# Patient Record
Sex: Female | Born: 1975 | Race: Black or African American | Hispanic: No | Marital: Married | Smoking: Never smoker
Health system: Southern US, Community
[De-identification: ages and names within clinical notes are randomized; demographics above are authoritative.]

## PROBLEM LIST (undated history)

## (undated) DIAGNOSIS — C801 Malignant (primary) neoplasm, unspecified: Secondary | ICD-10-CM

## (undated) DIAGNOSIS — J45909 Unspecified asthma, uncomplicated: Secondary | ICD-10-CM

## (undated) DIAGNOSIS — Z91018 Allergy to other foods: Secondary | ICD-10-CM

## (undated) DIAGNOSIS — L732 Hidradenitis suppurativa: Secondary | ICD-10-CM

## (undated) DIAGNOSIS — J309 Allergic rhinitis, unspecified: Secondary | ICD-10-CM

## (undated) DIAGNOSIS — M545 Low back pain, unspecified: Secondary | ICD-10-CM

## (undated) DIAGNOSIS — H1045 Other chronic allergic conjunctivitis: Secondary | ICD-10-CM

## (undated) DIAGNOSIS — M2550 Pain in unspecified joint: Secondary | ICD-10-CM

## (undated) DIAGNOSIS — Z91013 Allergy to seafood: Secondary | ICD-10-CM

## (undated) DIAGNOSIS — L503 Dermatographic urticaria: Secondary | ICD-10-CM

## (undated) DIAGNOSIS — S73191A Other sprain of right hip, initial encounter: Secondary | ICD-10-CM

## (undated) HISTORY — DX: Other chronic allergic conjunctivitis: H10.45

## (undated) HISTORY — DX: Hidradenitis suppurativa: L73.2

## (undated) HISTORY — DX: Allergy to seafood: Z91.013

## (undated) HISTORY — PX: ROTATOR CUFF REPAIR: SHX139

## (undated) HISTORY — DX: Pain in unspecified joint: M25.50

## (undated) HISTORY — DX: Allergic rhinitis, unspecified: J30.9

## (undated) HISTORY — DX: Allergy to other foods: Z91.018

## (undated) HISTORY — DX: Low back pain, unspecified: M54.50

## (undated) HISTORY — DX: Dermatographic urticaria: L50.3

## (undated) HISTORY — DX: Unspecified asthma, uncomplicated: J45.909

## (undated) HISTORY — DX: Other sprain of right hip, initial encounter: S73.191A

---

## 1998-06-04 ENCOUNTER — Emergency Department (HOSPITAL_COMMUNITY): Admission: EM | Admit: 1998-06-04 | Discharge: 1998-06-04 | Payer: Self-pay | Admitting: Emergency Medicine

## 1999-03-27 ENCOUNTER — Other Ambulatory Visit: Admission: RE | Admit: 1999-03-27 | Discharge: 1999-03-27 | Payer: Self-pay | Admitting: Obstetrics & Gynecology

## 1999-04-21 ENCOUNTER — Emergency Department (HOSPITAL_COMMUNITY): Admission: EM | Admit: 1999-04-21 | Discharge: 1999-04-21 | Payer: Self-pay | Admitting: Emergency Medicine

## 2000-07-07 ENCOUNTER — Other Ambulatory Visit: Admission: RE | Admit: 2000-07-07 | Discharge: 2000-07-07 | Payer: Self-pay | Admitting: Obstetrics & Gynecology

## 2008-06-12 ENCOUNTER — Emergency Department (HOSPITAL_COMMUNITY): Admission: EM | Admit: 2008-06-12 | Discharge: 2008-06-13 | Payer: Self-pay | Admitting: Emergency Medicine

## 2009-09-23 IMAGING — CT CT ANGIO CHEST
3 of 4 series · 18 of 31 positions shown · IV contrast (agent unspecified)
Comparison: None available

CLINICAL DATA: Abdominal pain.  Short of breath.  Leg swelling.

CT ANGIOGRAPHY CHEST
TECHNIQUE: Multidetector CT imaging of the chest using the
standard protocol during bolus administration of intravenous
contrast. Multiplanar reconstructed images including MIPs were
obtained and reviewed to evaluate the vascular anatomy.
Contrast: 100 ml [id number].

[Series 2: pe · axial · 0.70mm/px · z∈[-303,-44]mm · 12 of 254 slices shown]
[im 24/254  lung]
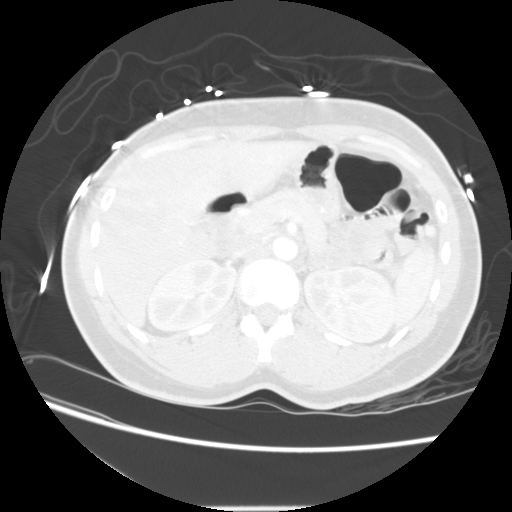
[im 47/254  mediastinal]
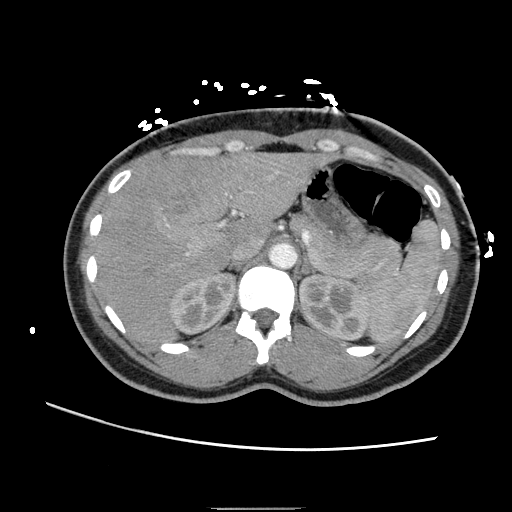
[im 70/254  lung]
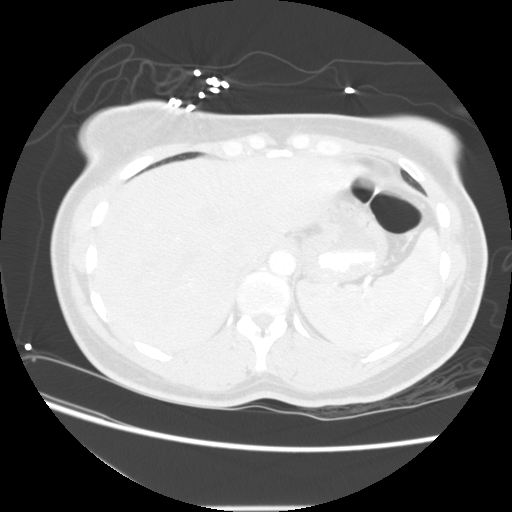
[im 93/254  mediastinal]
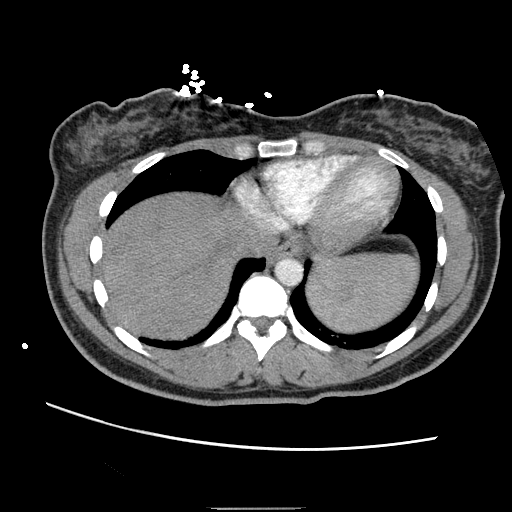
[im 116/254  lung]
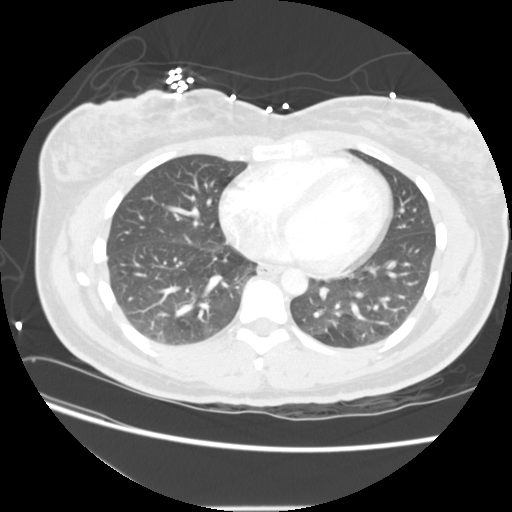
[im 127/254  mediastinal]
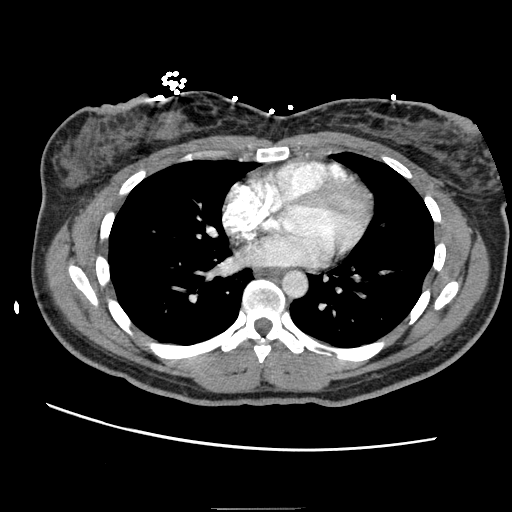
[im 129/254  lung]
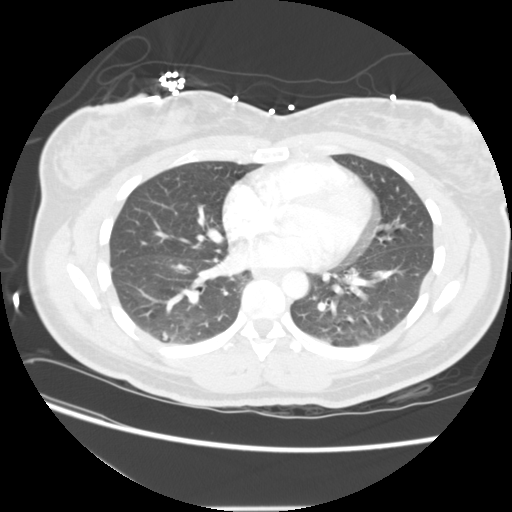
[im 139/254  mediastinal]
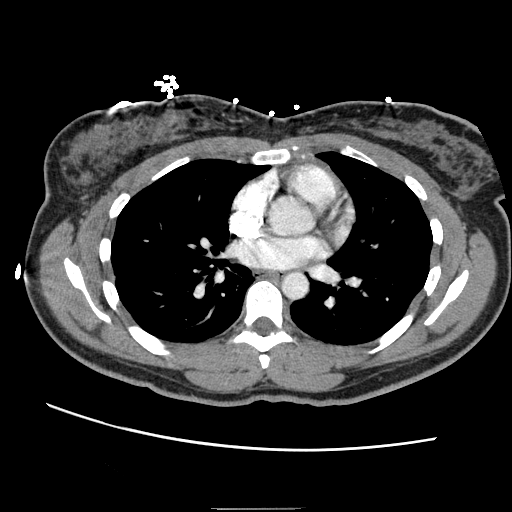
[im 162/254  lung]
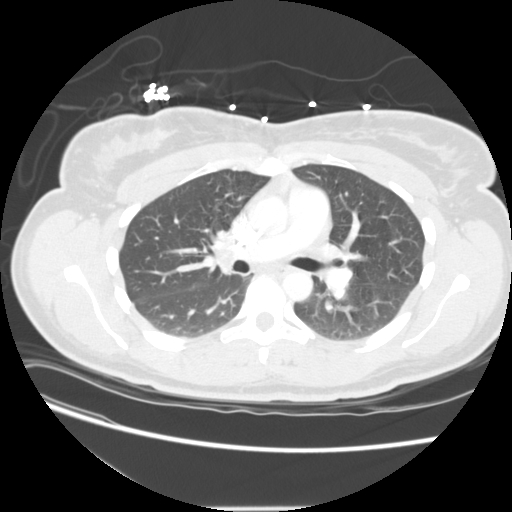
[im 185/254  mediastinal]
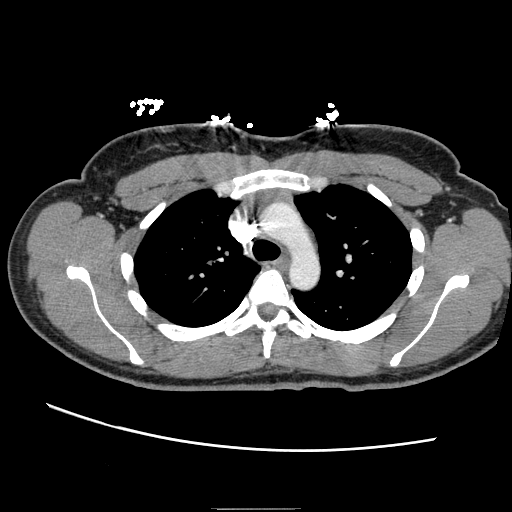
[im 208/254  lung]
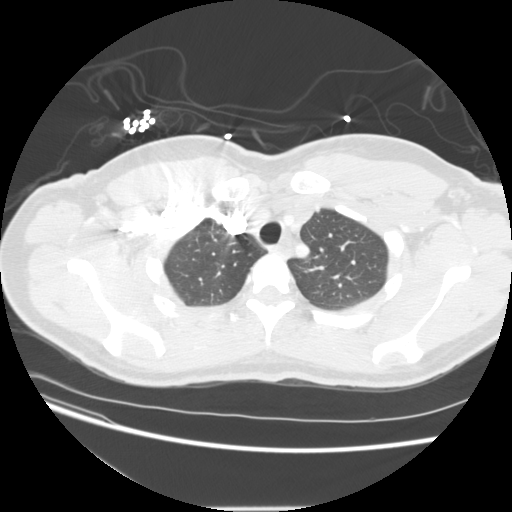
[im 231/254  mediastinal]
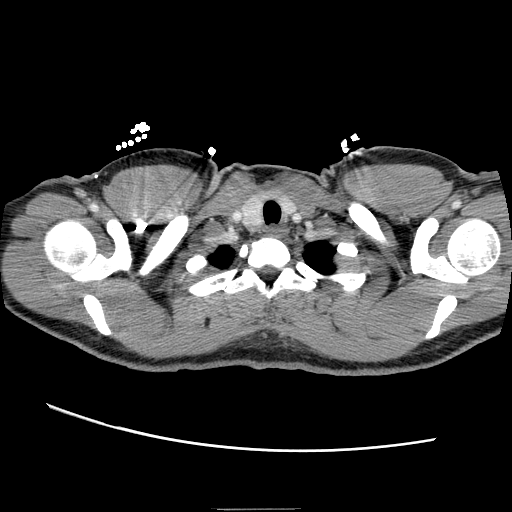

[Series 201: reformatted · sagittal · 0.70mm/px · 4 of 115 slices shown (1 of 2)]
[im 23/115  lung]
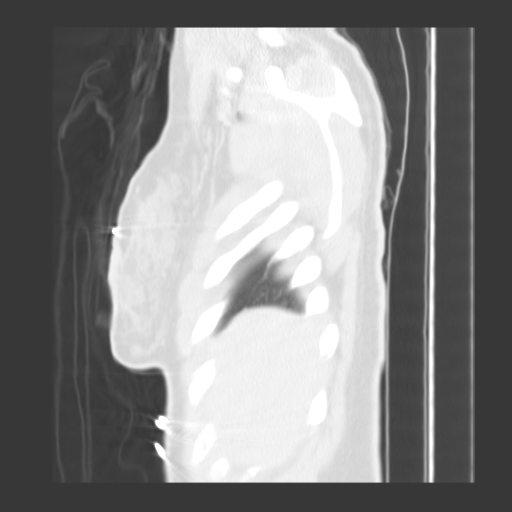
[im 46/115  lung]
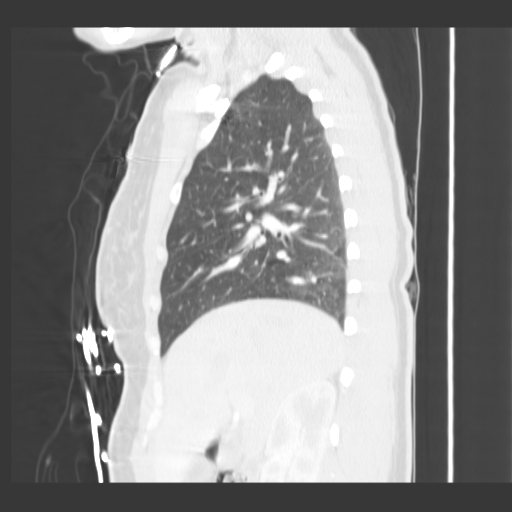
[im 69/115  lung]
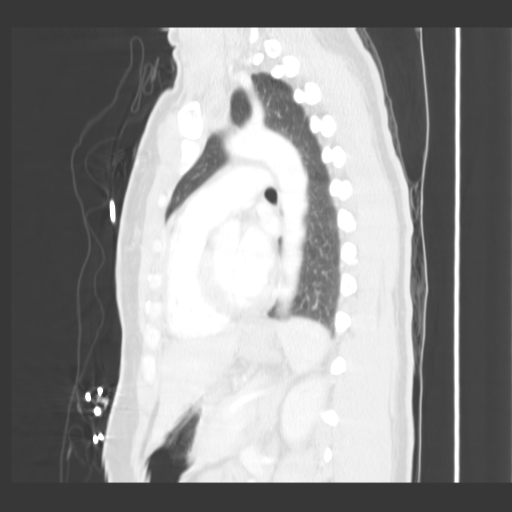
[im 92/115  lung]
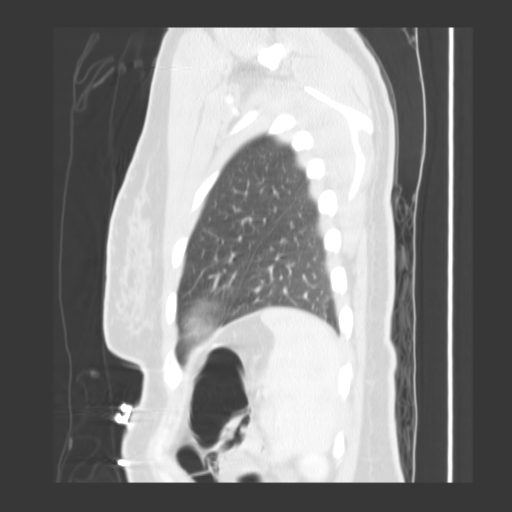

[Series 202: reformatted · coronal · 0.70mm/px · 2 of 81 slices shown (2 of 2)]
[im 27/81  lung]
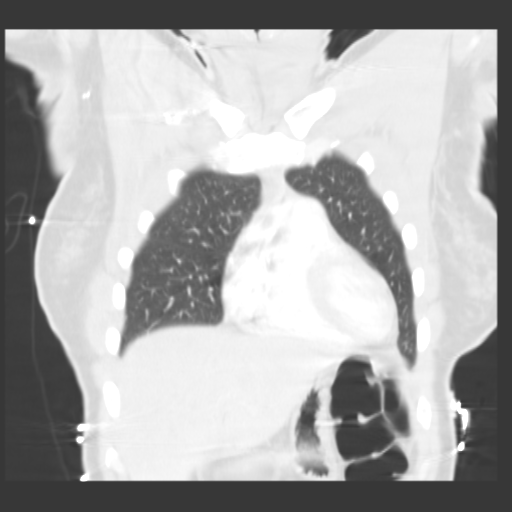
[im 54/81  lung]
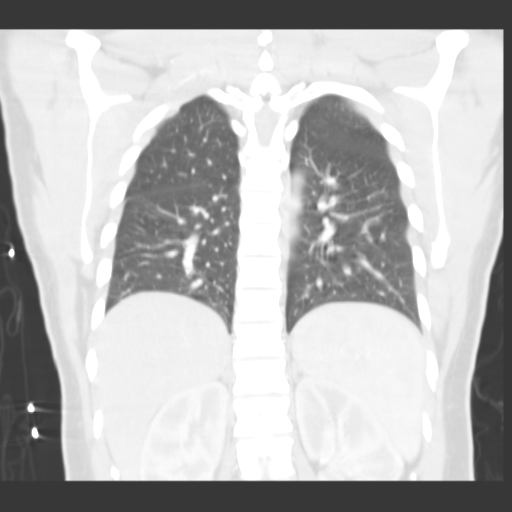

[18 of 31 positions shown; findings below may reference images not displayed]

FINDINGS: Study is mildly technically limited in its evaluation of
pulmonary embolus due to bolus dispersion.  Segmental and
subsegmental vessels not evaluated.  Within this limitation, no
pulmonary embolus is present.  Grossly the heart appears within
normal limits.  No axillary, mediastinal, or hilar adenopathy.  No
pleural or pericardial effusion is present.  Incidental imaging of
the upper abdomen is unremarkable.  Residual thymic tissue is
present in the anterior mediastinum.

Lung windows demonstrate bronchial thickening in the right upper
lobe.  Dependent atelectasis.  Basilar air trapping is present,
suggesting small airways/small vessel disease.
IMPRESSION: 1.  No central pulmonary embolus.  Segmental and subsegmental
pulmonary arteries not evaluated.
2.  Right upper lobe bronchial thickening suggesting inflammatory
or small airways/small vessel disease.  Air trapping present at
both lung bases as well.

## 2011-05-11 LAB — DIFFERENTIAL
Basophils Relative: 0
Eosinophils Absolute: 0.1
Eosinophils Relative: 2
Lymphocytes Relative: 12
Lymphs Abs: 0.7
Monocytes Absolute: 0.4

## 2011-05-11 LAB — URINALYSIS, ROUTINE W REFLEX MICROSCOPIC
Hgb urine dipstick: NEGATIVE
Ketones, ur: 15 — AB
Nitrite: NEGATIVE
Protein, ur: NEGATIVE
Specific Gravity, Urine: 1.005
pH: 7

## 2011-05-11 LAB — CBC
HCT: 38.2
MCHC: 33.5
Platelets: 189
RDW: 14.1
WBC: 5.5

## 2011-05-11 LAB — BASIC METABOLIC PANEL
Calcium: 9.3
Glucose, Bld: 108 — ABNORMAL HIGH

## 2011-05-11 LAB — D-DIMER, QUANTITATIVE: D-Dimer, Quant: 1.11 — ABNORMAL HIGH

## 2012-12-22 DIAGNOSIS — D649 Anemia, unspecified: Secondary | ICD-10-CM | POA: Insufficient documentation

## 2013-07-04 DIAGNOSIS — Y929 Unspecified place or not applicable: Secondary | ICD-10-CM | POA: Insufficient documentation

## 2013-07-04 DIAGNOSIS — Y9389 Activity, other specified: Secondary | ICD-10-CM | POA: Insufficient documentation

## 2013-07-04 DIAGNOSIS — X500XXA Overexertion from strenuous movement or load, initial encounter: Secondary | ICD-10-CM | POA: Insufficient documentation

## 2013-07-04 DIAGNOSIS — IMO0002 Reserved for concepts with insufficient information to code with codable children: Secondary | ICD-10-CM | POA: Insufficient documentation

## 2013-07-05 ENCOUNTER — Emergency Department (HOSPITAL_COMMUNITY)
Admission: EM | Admit: 2013-07-05 | Discharge: 2013-07-05 | Disposition: A | Discharge status: Home / Self Care | Payer: BC Managed Care – PPO | Attending: Emergency Medicine | Admitting: Emergency Medicine

## 2013-07-05 ENCOUNTER — Emergency Department (HOSPITAL_COMMUNITY): Payer: BC Managed Care – PPO

## 2013-07-05 ENCOUNTER — Encounter (HOSPITAL_COMMUNITY): Payer: Self-pay | Admitting: Emergency Medicine

## 2013-07-05 DIAGNOSIS — S46911A Strain of unspecified muscle, fascia and tendon at shoulder and upper arm level, right arm, initial encounter: Secondary | ICD-10-CM

## 2013-07-05 MED ORDER — KETOROLAC TROMETHAMINE 30 MG/ML IJ SOLN
30.0000 mg | Freq: Once | INTRAMUSCULAR | Status: AC
  Administered 2013-07-05: 30 mg via INTRAMUSCULAR
  Filled 2013-07-05: qty 1

## 2013-07-05 MED ORDER — IBUPROFEN 800 MG PO TABS: 800.0000 mg | ORAL_TABLET | Freq: Three times a day (TID) | ORAL | Status: DC

## 2013-07-05 NOTE — ED Provider Notes (Signed)
CSN: [id number]     Arrival date & time 07/04/13  2328 History   First MD Initiated Contact with Patient 07/05/13 0015     Chief Complaint  Patient presents with  . Shoulder Injury   HPI  History provided by the patient. Patient is a 37 year old female who presents with complaints of right shoulder pain and injury. Patient states that she was lifting and carrying her son to put him to bed who weighs approximately 60 pounds when she acutely has sharp pains in her right shoulder. Since that time she's had worsening pain with reduced range of motion. Pain is worse with any movements of the shoulder. She did take 2 ibuprofen several hours prior to arrival without any improvement. No other treatments use. Denies any other symptoms. No other aggravating or alleviating factors.    History reviewed. No pertinent past medical history. Past Surgical History  Procedure Laterality Date  . Cesarean section     No family history on file. History  Substance Use Topics  . Smoking status: Never Smoker   . Smokeless tobacco: Not on file  . Alcohol Use: No   OB History   Grav Para Term Preterm Abortions TAB SAB Ect Mult Living                 Review of Systems  Neurological: Negative for weakness and numbness.  All other systems reviewed and are negative.    Allergies  Review of patient's allergies indicates no known allergies.  Home Medications  No current outpatient prescriptions on file. BP 127/70  Pulse 76  Temp(Src) 98.1 F (36.7 C) (Oral)  Resp 18  SpO2 99%  LMP 06/18/2013 Physical Exam  Nursing note and vitals reviewed. Constitutional: She is oriented to person, place, and time. She appears well-developed and well-nourished. No distress.  HENT:  Head: Normocephalic.  Eyes: Conjunctivae are normal.  Cardiovascular: Normal rate and regular rhythm.   Pulmonary/Chest: Effort normal and breath sounds normal. No respiratory distress. She has no wheezes. She has no rales.   Musculoskeletal: She exhibits tenderness. She exhibits no edema.  Reduced range of motion of the right shoulder secondary to pain. There is tenderness over the anterior portion of the shoulder. Reduced range of motion is greatest with abduction and forward flexion. She has normal distal pulses, good strength and sensation in the hands. No swelling or deformity. No pain over the trapezius or scapular area. No pain over clavicle.  Neurological: She is alert and oriented to person, place, and time.  Skin: Skin is warm and dry. No rash noted.  Psychiatric: She has a normal mood and affect. Her behavior is normal.    ED Course  Procedures   Patient seen and evaluated. Patient appears well no acute distress. Does appearance some discomfort with reduced range of motion of right shoulder. X-rays reviewed without signs of fracture or dislocation. Based on history and exam suspect shoulder strain. Will provide arm sling for comfort. Patient advised to use rest, ice, compression elevation. She agrees with plan.  Imaging Review Dg Shoulder Right  07/05/2013   CLINICAL DATA:  Anterior right shoulder pain after injury tonight.  EXAM: RIGHT SHOULDER - 2+ VIEW  COMPARISON:  None.  FINDINGS: There is no evidence of fracture or dislocation. There is no evidence of arthropathy or other focal bone abnormality. Soft tissues are unremarkable.  IMPRESSION: Negative.   Electronically Signed   By: Burman Nieves M.D.   On: 07/05/2013 00:59     MDM  1. Shoulder strain, right, initial encounter        Angus Seller, PA-C 07/05/13 2021

## 2013-07-05 NOTE — ED Notes (Signed)
Pt. reports right shoulder joint pain onset this evening after she picked up her son to put him to bed.

## 2013-07-05 NOTE — ED Provider Notes (Signed)
Medical screening examination/treatment/procedure(s) were performed by non-physician practitioner and as supervising physician I was immediately available for consultation/collaboration.  Shanna Cisco, MD 07/05/13 2135

## 2015-07-02 IMAGING — CR DG SHOULDER 2+V*R*
3 series · 3 of 3 positions shown · non-contrast
Comparison: None.

CLINICAL DATA: Anterior right shoulder pain after injury tonight.

EXAM:
RIGHT SHOULDER - 2+ VIEW

[w shoulder external right]
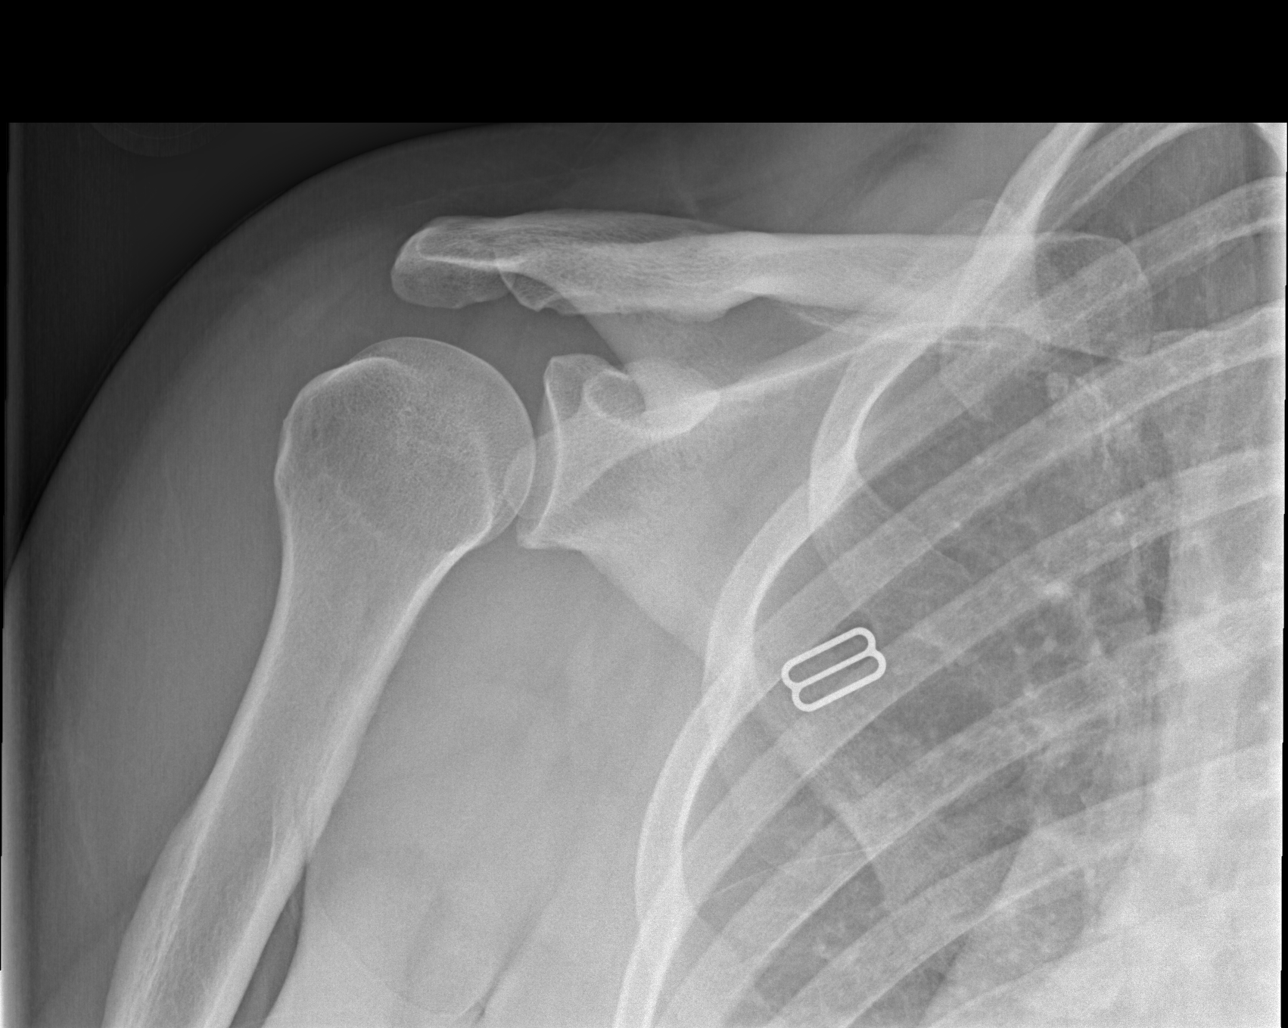

[w shoulder internal right]
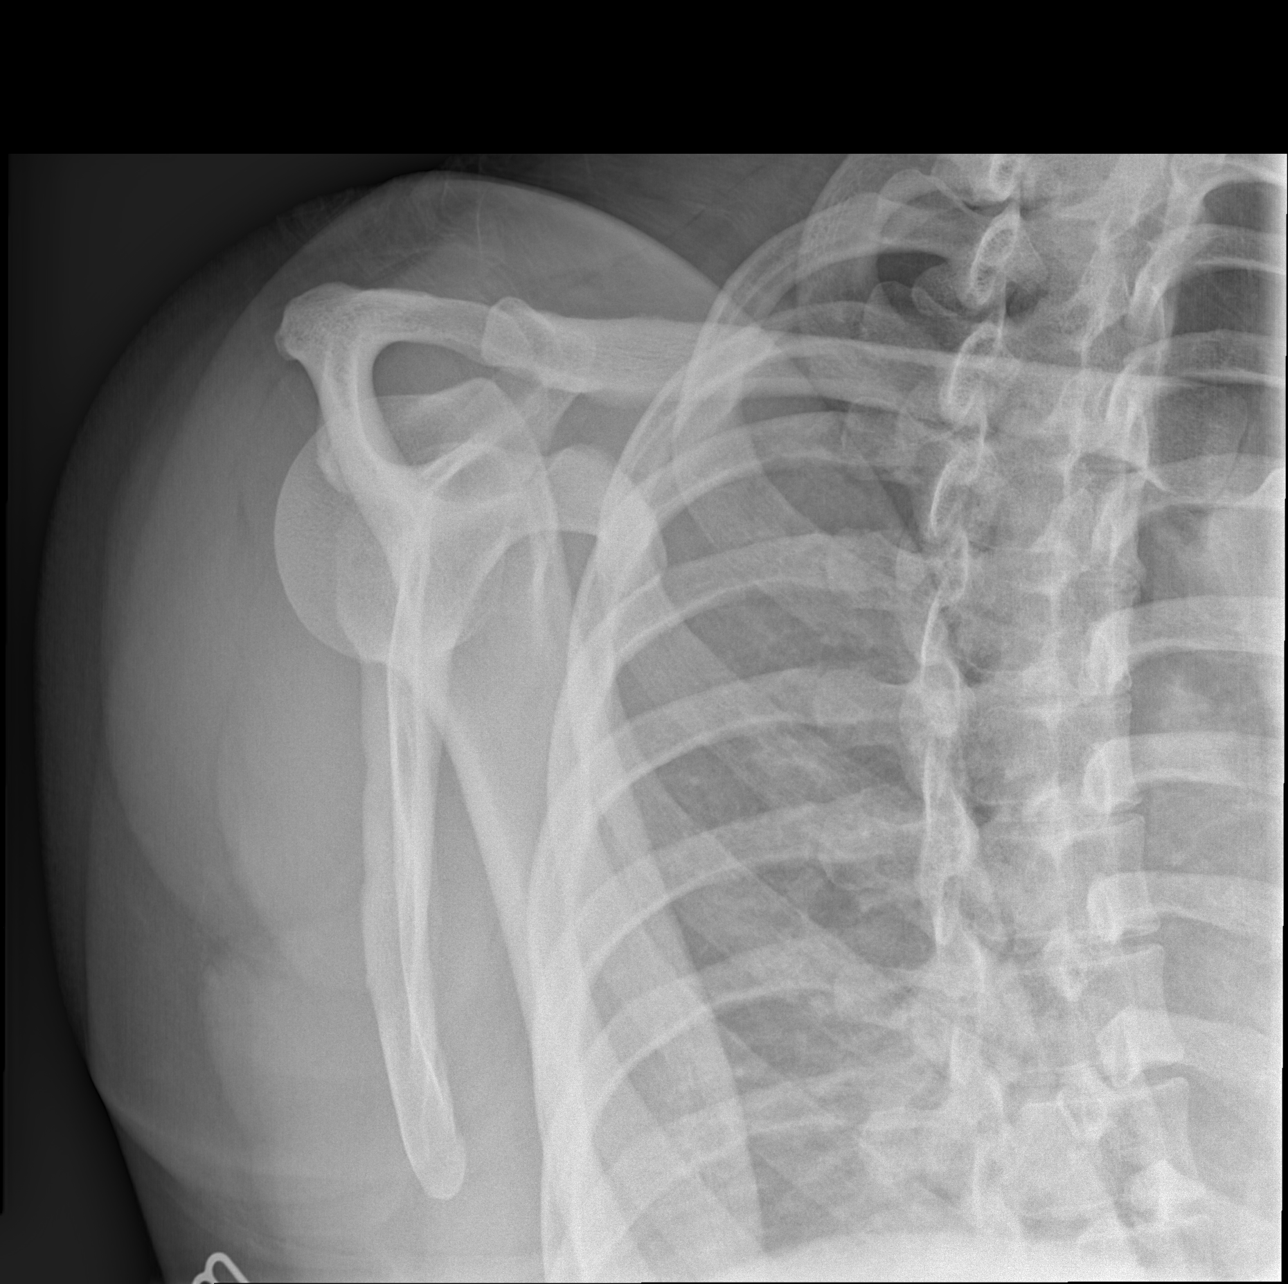

[x shoulder axillary right]
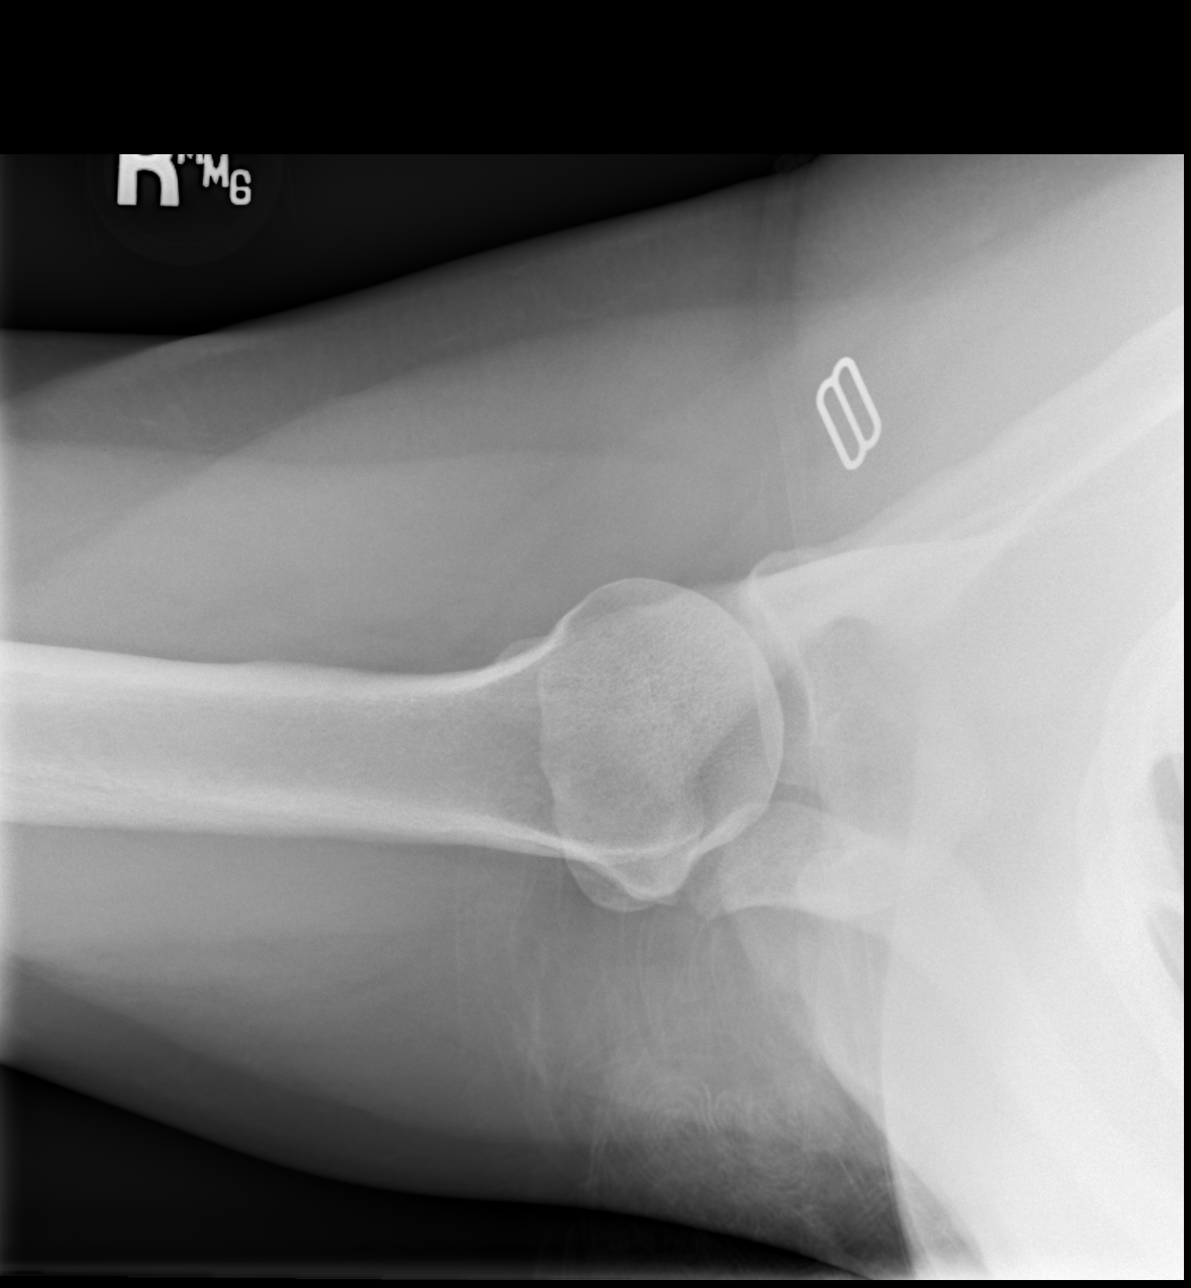

[3 of 3 positions shown; findings below may reference images not displayed]

FINDINGS: There is no evidence of fracture or dislocation. There is no
evidence of arthropathy or other focal bone abnormality. Soft
tissues are unremarkable.
IMPRESSION: Negative.

## 2019-10-31 ENCOUNTER — Other Ambulatory Visit: Payer: Self-pay

## 2021-04-09 HISTORY — PX: LABRAL REPAIR: SHX5172

## 2021-09-16 DIAGNOSIS — M0609 Rheumatoid arthritis without rheumatoid factor, multiple sites: Secondary | ICD-10-CM | POA: Diagnosis not present

## 2021-09-16 DIAGNOSIS — M65312 Trigger thumb, left thumb: Secondary | ICD-10-CM | POA: Diagnosis not present

## 2021-12-14 DIAGNOSIS — M0609 Rheumatoid arthritis without rheumatoid factor, multiple sites: Secondary | ICD-10-CM | POA: Diagnosis not present

## 2022-01-15 DIAGNOSIS — Z91018 Allergy to other foods: Secondary | ICD-10-CM | POA: Diagnosis not present

## 2022-01-15 DIAGNOSIS — L503 Dermatographic urticaria: Secondary | ICD-10-CM | POA: Diagnosis not present

## 2022-01-15 DIAGNOSIS — R052 Subacute cough: Secondary | ICD-10-CM | POA: Diagnosis not present

## 2022-01-15 DIAGNOSIS — Z91013 Allergy to seafood: Secondary | ICD-10-CM | POA: Diagnosis not present

## 2022-01-15 DIAGNOSIS — H1045 Other chronic allergic conjunctivitis: Secondary | ICD-10-CM | POA: Diagnosis not present

## 2022-02-25 DIAGNOSIS — L720 Epidermal cyst: Secondary | ICD-10-CM | POA: Diagnosis not present

## 2022-03-09 HISTORY — PX: DERMOID CYST  EXCISION: SHX1452

## 2022-03-15 DIAGNOSIS — L720 Epidermal cyst: Secondary | ICD-10-CM | POA: Diagnosis not present

## 2022-03-16 DIAGNOSIS — M0609 Rheumatoid arthritis without rheumatoid factor, multiple sites: Secondary | ICD-10-CM | POA: Diagnosis not present

## 2022-03-16 DIAGNOSIS — M1611 Unilateral primary osteoarthritis, right hip: Secondary | ICD-10-CM | POA: Diagnosis not present

## 2022-03-16 DIAGNOSIS — M65312 Trigger thumb, left thumb: Secondary | ICD-10-CM | POA: Diagnosis not present

## 2022-03-16 DIAGNOSIS — M5126 Other intervertebral disc displacement, lumbar region: Secondary | ICD-10-CM | POA: Diagnosis not present

## 2022-04-01 DIAGNOSIS — L720 Epidermal cyst: Secondary | ICD-10-CM | POA: Diagnosis not present

## 2022-05-25 DIAGNOSIS — U071 COVID-19: Secondary | ICD-10-CM | POA: Diagnosis not present

## 2022-05-25 DIAGNOSIS — J029 Acute pharyngitis, unspecified: Secondary | ICD-10-CM | POA: Diagnosis not present

## 2022-05-25 DIAGNOSIS — R059 Cough, unspecified: Secondary | ICD-10-CM | POA: Diagnosis not present

## 2022-05-25 DIAGNOSIS — R509 Fever, unspecified: Secondary | ICD-10-CM | POA: Diagnosis not present

## 2022-05-25 DIAGNOSIS — R52 Pain, unspecified: Secondary | ICD-10-CM | POA: Diagnosis not present

## 2022-06-07 DIAGNOSIS — J4520 Mild intermittent asthma, uncomplicated: Secondary | ICD-10-CM | POA: Diagnosis not present

## 2022-06-11 DIAGNOSIS — J0140 Acute pansinusitis, unspecified: Secondary | ICD-10-CM | POA: Diagnosis not present

## 2022-06-16 DIAGNOSIS — M0609 Rheumatoid arthritis without rheumatoid factor, multiple sites: Secondary | ICD-10-CM | POA: Diagnosis not present

## 2022-06-30 DIAGNOSIS — J329 Chronic sinusitis, unspecified: Secondary | ICD-10-CM | POA: Diagnosis not present

## 2022-07-14 DIAGNOSIS — Z13220 Encounter for screening for lipoid disorders: Secondary | ICD-10-CM | POA: Diagnosis not present

## 2022-07-14 DIAGNOSIS — Z0000 Encounter for general adult medical examination without abnormal findings: Secondary | ICD-10-CM | POA: Diagnosis not present

## 2022-08-31 DIAGNOSIS — J069 Acute upper respiratory infection, unspecified: Secondary | ICD-10-CM | POA: Diagnosis not present

## 2022-09-17 DIAGNOSIS — M1611 Unilateral primary osteoarthritis, right hip: Secondary | ICD-10-CM | POA: Diagnosis not present

## 2022-09-17 DIAGNOSIS — M65312 Trigger thumb, left thumb: Secondary | ICD-10-CM | POA: Diagnosis not present

## 2022-09-17 DIAGNOSIS — M5126 Other intervertebral disc displacement, lumbar region: Secondary | ICD-10-CM | POA: Diagnosis not present

## 2022-09-17 DIAGNOSIS — M0609 Rheumatoid arthritis without rheumatoid factor, multiple sites: Secondary | ICD-10-CM | POA: Diagnosis not present

## 2022-10-18 DIAGNOSIS — R519 Headache, unspecified: Secondary | ICD-10-CM | POA: Diagnosis not present

## 2022-10-18 DIAGNOSIS — J309 Allergic rhinitis, unspecified: Secondary | ICD-10-CM | POA: Diagnosis not present

## 2022-11-30 DIAGNOSIS — H01009 Unspecified blepharitis unspecified eye, unspecified eyelid: Secondary | ICD-10-CM | POA: Diagnosis not present

## 2022-12-14 ENCOUNTER — Encounter: Payer: Self-pay | Admitting: Neurology

## 2022-12-14 ENCOUNTER — Encounter: Payer: Self-pay | Admitting: *Deleted

## 2022-12-14 ENCOUNTER — Ambulatory Visit (INDEPENDENT_AMBULATORY_CARE_PROVIDER_SITE_OTHER): Payer: BC Managed Care – PPO | Admitting: Neurology

## 2022-12-14 VITALS — BP 112/72 | HR 68 | Ht 64.0 in | Wt 157.8 lb

## 2022-12-14 DIAGNOSIS — L732 Hidradenitis suppurativa: Secondary | ICD-10-CM | POA: Insufficient documentation

## 2022-12-14 DIAGNOSIS — N926 Irregular menstruation, unspecified: Secondary | ICD-10-CM | POA: Insufficient documentation

## 2022-12-14 DIAGNOSIS — M0609 Rheumatoid arthritis without rheumatoid factor, multiple sites: Secondary | ICD-10-CM | POA: Diagnosis not present

## 2022-12-14 DIAGNOSIS — G43709 Chronic migraine without aura, not intractable, without status migrainosus: Secondary | ICD-10-CM | POA: Diagnosis not present

## 2022-12-14 DIAGNOSIS — R102 Pelvic and perineal pain: Secondary | ICD-10-CM | POA: Insufficient documentation

## 2022-12-14 DIAGNOSIS — N920 Excessive and frequent menstruation with regular cycle: Secondary | ICD-10-CM | POA: Insufficient documentation

## 2022-12-14 DIAGNOSIS — J45909 Unspecified asthma, uncomplicated: Secondary | ICD-10-CM | POA: Insufficient documentation

## 2022-12-14 DIAGNOSIS — D573 Sickle-cell trait: Secondary | ICD-10-CM | POA: Insufficient documentation

## 2022-12-14 DIAGNOSIS — J309 Allergic rhinitis, unspecified: Secondary | ICD-10-CM

## 2022-12-14 MED ORDER — RIZATRIPTAN BENZOATE 10 MG PO TBDP: 10.0000 mg | ORAL_TABLET | ORAL | 11 refills | Status: DC | PRN

## 2022-12-14 MED ORDER — ONDANSETRON 4 MG PO TBDP: 4.0000 mg | ORAL_TABLET | Freq: Three times a day (TID) | ORAL | 3 refills | Status: DC | PRN

## 2022-12-14 MED ORDER — FREMANEZUMAB-VFRM 225 MG/1.5ML ~~LOC~~ SOSY: 225.0000 mg | PREFILLED_SYRINGE | Freq: Once | SUBCUTANEOUS | Status: AC

## 2022-12-14 MED ORDER — AJOVY 225 MG/1.5ML ~~LOC~~ SOAJ: 225.0000 mg | SUBCUTANEOUS | 11 refills | Status: DC

## 2022-12-14 NOTE — Progress Notes (Signed)
[facility]    Provider:  Dr Lucia Gaskins Requesting Provider: Deatra James, MD Primary Care Provider:  Deatra James, MD  CC:  chronic headaches  HPI:  Sherry Abbott is a 47 y.o. female here as requested by Deatra James, MD for migraines. P,hx has Hidradenitis suppurativa; Anemia; Irregular periods; Menorrhagia; Pain in pelvis; Sickle cell trait (HCC); Allergic rhinitis; Asthma; and Chronic migraine without aura without status migrainosus, not intractable on their problem list.  She also has a past medical history of asthma, polyarthralgia, hidradenitis suppurativa, urticaria, allergies shellfish per review of my referral from Dr. Wynelle Link, Dr. Wynelle Link statesI reviewed Dr. Chase Caller notes, she has had a chronic headache for 6 months following COVID infection, new photophobia and pressure along the forehead for the past week,, they did explain that headaches can be tied to prior COVID infection however with the new photophobia and prior history of migraines she was referred to neurology.  Per Dr. Wynelle Link she is taking methotrexate for her rheumatologic disorders, she is also taking gabapentin, B complex, multivitamin, folic acid, meloxicam along with Zyrtec and her other medications.  She has been dealing with the headaches since getting COVID.  She sees an allergist Dr. Madie Reno, and [facility] rheumatology for her inflammatory polyarthritis.  Past history of migraines in middle school, mother had migraines, daughter and son have headaches/migraines. Migraines on an doff throughout the years, last very bad one was 7 years ago and she had to call EMS, they can be severe, pulsating/pounding/trobbing,photo/phonophobia, nausea, hurts to move, cold drinks will trigger, a dark room quiet helps. She has auras. She has what may be an aura, positive bright lights and circles may e not be associated she will pay more attention. She has motion sensitivity and gets vertigo/vestibular symptoms. Worse with  periods. Prior to covid she was having with her period and occassionally with stress or other triggers sporadic 4-5 month tylenol used to help. Could be moderate to severe, no medication overuse, Recently an expresso. She had covid in 05/2022 and since them the migraines are worse. She has daily headaches, at least 8 moderate to severe migraine days a month.intererring with her life, not sleeping well because she has nocturnal headaches, she can wake up with a headache, positional, vision changes, ringing on the left side. The migraines are mostly on the right side of the head with tight shoulders and can radiate to the shoulders, she gets blurry vision and she can't even see and there is so much pain she tender skin even glasses can't touch the phase.    Reviewed notes, labs and imaging from outside physicians, which showed:  I reviewed labs from Dr. Chase Caller notes, only a lipid panel included LDL 123 we will either get her own labs or ask for a copy of most recent labs.  From a thorough review of records, medications tried that can be used in migraine management include: Meloxicam, B complex, gabapentin, pregabalin(caused sedation AEDs like topamax contraindicated due to sedation), Toradol injections.propranolol contraindicated due to asthma and also BP meds contraindicated due to hypotension, Amitriptyline and TCAs(amitriptyline,nortriptyline) contraindicated due to asthma, aimovig contradicated due to constipation. She took propranolol as a child had side effects has asthma, amitriptyline as a child sedation, topamax also contraindicated intreraction with methotrexate, tried imitrex as a child  I do not see any brain imaging on epic or Care Everywhere review.  Has regular bloodwork due to methotrexate,     Review of Systems: Patient complains of symptoms per HPI as well  as the following symptoms migraines. Pertinent negatives and positives per HPI. All others negative.   Social History    Socioeconomic History   Marital status: Single    Spouse name: Not on file   Number of children: Not on file   Years of education: Not on file   Highest education level: Not on file  Occupational History   Not on file  Tobacco Use   Smoking status: Never   Smokeless tobacco: Never  Vaping Use   Vaping Use: Never used  Substance and Sexual Activity   Alcohol use: No   Drug use: No   Sexual activity: Not on file  Other Topics Concern   Not on file  Social History Narrative   Caffiene occasional coffee (herbal tea)   Work :  [employer], research lab (study respiratory diseases)   Social Determinants of Health   Financial Resource Strain: Not on file  Food Insecurity: Not on file  Transportation Needs: Not on file  Physical Activity: Not on file  Stress: Not on file  Social Connections: Not on file  Intimate Partner Violence: Not on file    Family History  Problem Relation Age of Onset   Migraines Mother    Pulmonary disease Mother    Cancer Father     Past Medical History:  Diagnosis Date   Allergic rhinitis    Allergic to food    blueberry   Allergic to shellfish    Asthma    Dermatographic urticaria    Hidradenitis suppurativa    Labral tear of right hip joint    surgery   Low back pain    Other chronic allergic conjunctivitis    Polyarthralgia     Patient Active Problem List   Diagnosis Date Noted   Hidradenitis suppurativa 12/14/2022   Irregular periods 12/14/2022   Menorrhagia 12/14/2022   Pain in pelvis 12/14/2022   Sickle cell trait (HCC) 12/14/2022   Allergic rhinitis 12/14/2022   Asthma 12/14/2022   Chronic migraine without aura without status migrainosus, not intractable 12/14/2022   Anemia 12/22/2012    Past Surgical History:  Procedure Laterality Date   CESAREAN SECTION     ROTATOR CUFF REPAIR Right    2015    Current Outpatient Medications  Medication Sig Dispense Refill   B Complex Vitamins (VITAMIN B-COMPLEX) TABS Take 1 tablet  by mouth daily at 6 (six) AM.     cetirizine (ZYRTEC ALLERGY) 10 MG tablet Take 10 mg by mouth daily.     fluticasone (FLONASE) 50 MCG/ACT nasal spray Place 2 sprays into both nostrils daily as needed.     folic acid (FOLVITE) 1 MG tablet Take 1 mg by mouth daily.     Fremanezumab-vfrm (AJOVY) 225 MG/1.5ML SOAJ Inject 225 mg into the skin every 30 (thirty) days. Copay card: BIN  [id number] GRP [id number]  ID [id number] PCN: PDMI expires 07/2023 1.5 mL 11   gabapentin (NEURONTIN) 300 MG capsule Take 300 mg by mouth at bedtime.     meloxicam (MOBIC) 15 MG tablet Take 15 mg by mouth daily.     methotrexate (RHEUMATREX) 2.5 MG tablet Take 10 mg by mouth once a week.     montelukast (SINGULAIR) 10 MG tablet Take 10 mg by mouth at bedtime.     Multiple Vitamin (MULTIVITAMIN WITH MINERALS) TABS tablet Take 1 tablet by mouth daily.     ondansetron (ZOFRAN-ODT) 4 MG disintegrating tablet Take 1-2 tablets (4-8 mg total) by mouth every 8 (eight) hours  as needed. 30 tablet 3   rizatriptan (MAXALT-MLT) 10 MG disintegrating tablet Take 1 tablet (10 mg total) by mouth as needed for migraine. May repeat in 2 hours if needed 9 tablet 11   Current Facility-Administered Medications  Medication Dose Route Frequency Provider Last Rate Last Admin   Fremanezumab-vfrm SOSY 225 mg  225 mg Subcutaneous Once Anson Fret, MD        Allergies as of 12/14/2022 - Review Complete 12/14/2022  Allergen Reaction Noted   Neosporin [neomycin-bacitracin zn-polymyx] Other (See Comments) 07/05/2013   Shellfish allergy Anaphylaxis 07/05/2013   Sulfa antibiotics Rash 07/05/2013   Betadine [povidone iodine] Rash 07/05/2013    Vitals: BP 112/72   Pulse 68   Ht 5\' 4"  (1.626 m)   Wt 157 lb 12.8 oz (71.6 kg)   BMI 27.09 kg/m  Last Weight:  Wt Readings from Last 1 Encounters:  12/14/22 157 lb 12.8 oz (71.6 kg)   Last Height:   Ht Readings from Last 1 Encounters:  12/14/22 5\' 4"  (1.626 m)     Physical exam: Exam: Gen:  NAD, conversant, well nourised, , well groomed                     CV: RRR, no MRG. No Carotid Bruits. No peripheral edema, warm, nontender Eyes: Conjunctivae clear without exudates or hemorrhage  Neuro: Detailed Neurologic Exam  Speech:    Speech is normal; fluent and spontaneous with normal comprehension.  Cognition:    The patient is oriented to person, place, and time;     recent and remote memory intact;     language fluent;     normal attention, concentration,     fund of knowledge Cranial Nerves:    The pupils are equal, round, and reactive to light. Attempted pupils too small to visualize fundi Visual fields are full to finger confrontation. Extraocular movements are intact. Trigeminal sensation is intact and the muscles of mastication are normal. The face is symmetric. The palate elevates in the midline. Hearing intact. Voice is normal. Shoulder shrug is normal. The tongue has normal motion without fasciculations.   Coordination: nml  Gait: nml  Motor Observation:    No asymmetry, no atrophy, and no involuntary movements noted. Tone:    Normal muscle tone.    Posture:    Posture is normal. normal erect    Strength:    Strength is V/V in the upper and lower limbs.      Sensation: intact to LT     Reflex Exam:  DTR's:    Deep tendon reflexes in the upper and lower extremities are normal bilaterally.   Toes:    The toes are downgoing bilaterally.   Clonus:    Clonus is absent.    Assessment/Plan:  Patient with chronic migraines. Will try and get Ajovy will see insurance will approve based on prior history of medication and contraindicated medications. Spent an extended visit providing migraine education, literature to read.  -Journal every day to get a good insight on how many headaches, how often, migraines, triggers etc - discussed what to journal.  Preventative: Ajovy once monthly (Emgality) - also new medications include qulipta, nurtec, ubrelvy,  zevagepant Emergency: Rizatriptan: Please take one tablet at the onset of your headache. If it does not improve the symptoms please take one additional tablet in 2 hours. Do not take more then 2 tablets in 24hrs. Do not take use more then 2 to 3 times in a week.  Nausea: Ondansetron Consider MRI brain if symptoms worsen, change or do not improve     No orders of the defined types were placed in this encounter.  Meds ordered this encounter  Medications   Fremanezumab-vfrm (AJOVY) 225 MG/1.5ML SOAJ    Sig: Inject 225 mg into the skin every 30 (thirty) days. Copay card: BIN  [id number] GRP [id number]  ID [id number] PCN: PDMI expires 07/2023    Dispense:  1.5 mL    Refill:  11    Copay card: BIN  [id number] GRP [id number]  ID [id number] PCN: PDMI expires 07/2023   rizatriptan (MAXALT-MLT) 10 MG disintegrating tablet    Sig: Take 1 tablet (10 mg total) by mouth as needed for migraine. May repeat in 2 hours if needed    Dispense:  9 tablet    Refill:  11   ondansetron (ZOFRAN-ODT) 4 MG disintegrating tablet    Sig: Take 1-2 tablets (4-8 mg total) by mouth every 8 (eight) hours as needed.    Dispense:  30 tablet    Refill:  3   Fremanezumab-vfrm SOSY 225 mg    [id number] 5-26   Discussed: To prevent or relieve headaches, try the following: Cool Compress. Lie down and place a cool compress on your head.  Avoid headache triggers. If certain foods or odors seem to have triggered your migraines in the past, avoid them. A headache diary might help you identify triggers.  Include physical activity in your daily routine. Try a daily walk or other moderate aerobic exercise.  Manage stress. Find healthy ways to cope with the stressors, such as delegating tasks on your to-do list.  Practice relaxation techniques. Try deep breathing, yoga, massage and visualization.  Eat regularly. Eating regularly scheduled meals and maintaining a healthy diet might help prevent headaches. Also, drink plenty of fluids.  Follow a  regular sleep schedule. Sleep deprivation might contribute to headaches Consider biofeedback. With this mind-body technique, you learn to control certain bodily functions -- such as muscle tension, heart rate and blood pressure -- to prevent headaches or reduce headache pain.    Proceed to emergency room if you experience new or worsening symptoms or symptoms do not resolve, if you have new neurologic symptoms or if headache is severe, or for any concerning symptom.   Provided education and documentation from American headache Society toolbox including articles on: chronic migraine medication overuse headache, chronic migraines, prevention of migraines, behavioral and other nonpharmacologic treatments for headache.  Cc: Deatra James, MD,  Deatra James, MD  Naomie Dean, MD  [facility] [address] [place], [place] [address]  Phone [phone] Fax [phone]  I spent over 60 minutes of face-to-face and non-face-to-face time with patient on the  1. Chronic migraine without aura without status migrainosus, not intractable    diagnosis.  This included previsit chart review, lab review, study review, order entry, electronic health record documentation, patient education on the different diagnostic and therapeutic options, counseling and coordination of care, risks and benefits of management, compliance, or risk factor reduction

## 2022-12-14 NOTE — Patient Instructions (Addendum)
Preventative: Ajovy once monthly ([facility]) - also new medications include qulipta, nurtec, ubrelvy, zevagepant Emergency: Rizatriptan: Please take one tablet at the onset of your headache. If it does not improve the symptoms please take one additional tablet in 2 hours. Do not take more then 2 tablets in 24hrs. Do not take use more then 2 to 3 times in a week. Nausea: Ondansetron Consider MRI brain if symptoms worsen, change or do not improve   Fremanezumab Injection What is this medication? FREMANEZUMAB (fre ma NEZ ue mab) prevents migraines. It works by blocking a substance in the body that causes migraines. It is a monoclonal antibody. This medicine may be used for other purposes; ask your health care provider or pharmacist if you have questions. COMMON BRAND NAME(S): AJOVY What should I tell my care team before I take this medication? They need to know if you have any of these conditions: An unusual or allergic reaction to fremanezumab, other medications, foods, dyes, or preservatives Pregnant or trying to get pregnant Breast-feeding How should I use this medication? This medication is injected under the skin. You will be taught how to prepare and give it. Take it as directed on the prescription label. Keep taking it unless your care team tells you to stop. It is important that you put your used needles and syringes in a special sharps container. Do not put them in a trash can. If you do not have a sharps container, call your pharmacist or care team to get one. Talk to your care team about the use of this medication in children. Special care may be needed. Overdosage: If you think you have taken too much of this medicine contact a poison control center or emergency room at once. NOTE: This medicine is only for you. Do not share this medicine with others. What if I miss a dose? If you miss a dose, take it as soon as you can. If it is almost time for your next dose, take only that dose. Do  not take double or extra doses. What may interact with this medication? Interactions are not expected. This list may not describe all possible interactions. Give your health care provider a list of all the medicines, herbs, non-prescription drugs, or dietary supplements you use. Also tell them if you smoke, drink alcohol, or use illegal drugs. Some items may interact with your medicine. What should I watch for while using this medication? Tell your care team if your symptoms do not start to get better or if they get worse. What side effects may I notice from receiving this medication? Side effects that you should report to your care team as soon as possible: Allergic reactions or angioedema--skin rash, itching or hives, swelling of the face, eyes, lips, tongue, arms, or legs, trouble swallowing or breathing Side effects that usually do not require medical attention (report to your care team if they continue or are bothersome): Pain, redness, or irritation at injection site This list may not describe all possible side effects. Call your doctor for medical advice about side effects. You may report side effects to FDA at 1-800-FDA-1088. Where should I keep my medication? Keep out of the reach of children and pets. Store in a refrigerator or at room temperature between 20 and 25 degrees C (68 and 77 degrees F). Refrigeration (preferred): Store in the refrigerator. Do not freeze. Keep in the original container until you are ready to take it. Remove the dose from the carton about 30 minutes before it  is time for you to use it. If the dose is not used, it may be stored in the original container at room temperature for 7 days. Get rid of any unused medication after the expiration date. Room Temperature: This medication may be stored at room temperature for up to 7 days. Keep it in the original container. Protect from light until time of use. If it is stored at room temperature, get rid of any unused  medication after 7 days or after it expires, whichever is first. To get rid of medications that are no longer needed or have expired: Take the medication to a medication take-back program. Check with your pharmacy or law enforcement to find a location. If you cannot return the medication, ask your pharmacist or care team how to get rid of this medication safely. NOTE: This sheet is a summary. It may not cover all possible information. If you have questions about this medicine, talk to your doctor, pharmacist, or health care provider.  2023 Elsevier/Gold Standard (2021-09-15 00:00:00)  Ondansetron Dissolving Tablets What is this medication? ONDANSETRON (on DAN se tron) prevents nausea and vomiting from chemotherapy, radiation, or surgery. It works by blocking substances in the body that may cause nausea or vomiting. It belongs to a group of medications called antiemetics. This medicine may be used for other purposes; ask your health care provider or pharmacist if you have questions. COMMON BRAND NAME(S): Zofran ODT What should I tell my care team before I take this medication? They need to know if you have any of these conditions: Heart disease History of irregular heartbeat Liver disease Low levels of magnesium or potassium in the blood An unusual or allergic reaction to ondansetron, granisetron, other medications, foods, dyes, or preservatives Pregnant or trying to get pregnant Breast-feeding How should I use this medication? These tablets are made to dissolve in the mouth. Do not try to push the tablet through the foil backing. With dry hands, peel away the foil backing and gently remove the tablet. Place the tablet in the mouth and allow it to dissolve, then swallow. While you may take these tablets with water, it is not necessary to do so. Talk to your care team regarding the use of this medication in children. Special care may be needed. Overdosage: If you think you have taken too much  of this medicine contact a poison control center or emergency room at once. NOTE: This medicine is only for you. Do not share this medicine with others. What if I miss a dose? If you miss a dose, take it as soon as you can. If it is almost time for your next dose, take only that dose. Do not take double or extra doses. What may interact with this medication? Do not take this medication with any of the following: Apomorphine Certain medications for fungal infections like fluconazole, itraconazole, ketoconazole, posaconazole, voriconazole Cisapride Dronedarone Pimozide Thioridazine This medication may also interact with the following: Carbamazepine Certain medications for depression, anxiety, or psychotic disturbances Fentanyl Linezolid MAOIs like Carbex, Eldepryl, Marplan, Nardil, and Parnate Methylene blue (injected into a vein) Other medications that prolong the QT interval (cause an abnormal heart rhythm) like dofetilide, ziprasidone Phenytoin Rifampicin Tramadol This list may not describe all possible interactions. Give your health care provider a list of all the medicines, herbs, non-prescription drugs, or dietary supplements you use. Also tell them if you smoke, drink alcohol, or use illegal drugs. Some items may interact with your medicine. What should I watch for  while using this medication? Check with your care team as soon as you can if you have any sign of an allergic reaction. What side effects may I notice from receiving this medication? Side effects that you should report to your care team as soon as possible: Allergic reactions--skin rash, itching, hives, swelling of the face, lips, tongue, or throat Bowel blockage--stomach cramping, unable to have a bowel movement or pass gas, loss of appetite, vomiting Chest pain (angina)--pain, pressure, or tightness in the chest, neck, back, or arms Heart rhythm changes--fast or irregular heartbeat, dizziness, feeling faint or  lightheaded, chest pain, trouble breathing Irritability, confusion, fast or irregular heartbeat, muscle stiffness, twitching muscles, sweating, high fever, seizure, chills, vomiting, diarrhea, which may be signs of serotonin syndrome Side effects that usually do not require medical attention (report to your care team if they continue or are bothersome): Constipation Diarrhea General discomfort and fatigue Headache This list may not describe all possible side effects. Call your doctor for medical advice about side effects. You may report side effects to FDA at 1-800-FDA-1088. Where should I keep my medication? Keep out of the reach of children and pets. Store between 2 and 30 degrees C (36 and 86 degrees F). Throw away any unused medication after the expiration date. NOTE: This sheet is a summary. It may not cover all possible information. If you have questions about this medicine, talk to your doctor, pharmacist, or health care provider.  2023 Elsevier/Gold Standard (2007-09-16 00:00:00) Rizatriptan Disintegrating Tablets What is this medication? RIZATRIPTAN (rye za TRIP tan) treats migraines. It works by blocking pain signals and narrowing blood vessels in the brain. It belongs to a group of medications called triptans. It is not used to prevent migraines. This medicine may be used for other purposes; ask your health care provider or pharmacist if you have questions. COMMON BRAND NAME(S): Maxalt-MLT What should I tell my care team before I take this medication? They need to know if you have any of these conditions: Circulation problems in fingers and toes Diabetes Heart disease High blood pressure High cholesterol History of irregular heartbeat History of stroke Stomach or intestine problems Tobacco use An unusual or allergic reaction to rizatriptan, other medications, foods, dyes, or preservatives Pregnant or trying to get pregnant Breast-feeding How should I use this  medication? Take this medication by mouth. Take it as directed on the prescription label. You do not need water to take this medication. Leave the tablet in the sealed pack until you are ready to take it. With dry hands, open the pack and gently remove the tablet. If the tablet breaks or crumbles, throw it away. Use a new tablet. Place the tablet on the tongue and allow it to dissolve. Then, swallow it. Do not cut, crush, or chew this medication. Do not use it more often than directed. Talk to your care team about the use of this medication in children. While it may be prescribed for children as young as 6 years for selected conditions, precautions do apply. Overdosage: If you think you have taken too much of this medicine contact a poison control center or emergency room at once. NOTE: This medicine is only for you. Do not share this medicine with others. What if I miss a dose? This does not apply. This medication is not for regular use. What may interact with this medication? Do not take this medication with any of the following: Ergot alkaloids, such as dihydroergotamine, ergotamine MAOIs, such as Marplan, Nardil, Parnate Other  medications for migraine headache, such as almotriptan, eletriptan, frovatriptan, naratriptan, sumatriptan, zolmitriptan This medication may also interact with the following: Certain medications for depression, anxiety, or other mental health conditions Propranolol This list may not describe all possible interactions. Give your health care provider a list of all the medicines, herbs, non-prescription drugs, or dietary supplements you use. Also tell them if you smoke, drink alcohol, or use illegal drugs. Some items may interact with your medicine. What should I watch for while using this medication? Visit your care team for regular checks on your progress. Tell your care team if your symptoms do not start to get better or if they get worse. This medication may affect your  coordination, reaction time, or judgment. Do not drive or operate machinery until you know how this medication affects you. Sit up or stand slowly to reduce the risk of dizzy or fainting spells. If you take migraine medications for 10 or more days a month, your migraines may get worse. Keep a diary of headache days and medication use. Contact your care team if your migraine attacks occur more frequently. What side effects may I notice from receiving this medication? Side effects that you should report to your care team as soon as possible: Allergic reactions--skin rash, itching, hives, swelling of the face, lips, tongue, or throat Burning, pain, tingling, or color changes in the hands, arms, legs, or feet Heart attack--pain or tightness in the chest, shoulders, arms, or jaw, nausea, shortness of breath, cold or clammy skin, feeling faint or lightheaded Heart rhythm changes--fast or irregular heartbeat, dizziness, feeling faint or lightheaded, chest pain, trouble breathing Increase in blood pressure Irritability, confusion, fast or irregular heartbeat, muscle stiffness, twitching muscles, sweating, high fever, seizure, chills, vomiting, diarrhea, which may be signs of serotonin syndrome Raynaud syndrome--cool, numb, or painful fingers or toes that may change color from pale, to blue, to red Seizures Stroke--sudden numbness or weakness of the face, arm, or leg, trouble speaking, confusion, trouble walking, loss of balance or coordination, dizziness, severe headache, change in vision Sudden or severe stomach pain, bloody diarrhea, fever, nausea, vomiting Vision loss Side effects that usually do not require medical attention (report to your care team if they continue or are bothersome): Dizziness Unusual weakness or fatigue This list may not describe all possible side effects. Call your doctor for medical advice about side effects. You may report side effects to FDA at 1-800-FDA-1088. Where should I  keep my medication? Keep out of the reach of children and pets. Store at room temperature between 15 and 30 degrees C (59 and 86 degrees F). Protect from light and moisture. Get rid of any unused medication after the expiration date. To get rid of medications that are no longer needed or have expired: Take the medication to a medication take-back program. Check with your pharmacy or law enforcement to find a location. If you cannot return the medication, check the label or package insert to see if the medication should be thrown out in the garbage or flushed down the toilet. If you are not sure, ask your care team. If it is safe to put it in the trash, empty the medication out of the container. Mix the medication with cat litter, dirt, coffee grounds, or other unwanted substance. Seal the mixture in a bag or container. Put it in the trash. NOTE: This sheet is a summary. It may not cover all possible information. If you have questions about this medicine, talk to your doctor, pharmacist, or  health care provider.  2023 Elsevier/Gold Standard (2021-11-26 00:00:00)

## 2023-01-04 ENCOUNTER — Ambulatory Visit: Payer: BC Managed Care – PPO | Admitting: Neurology

## 2023-01-18 DIAGNOSIS — Z91013 Allergy to seafood: Secondary | ICD-10-CM | POA: Diagnosis not present

## 2023-01-18 DIAGNOSIS — H1045 Other chronic allergic conjunctivitis: Secondary | ICD-10-CM | POA: Diagnosis not present

## 2023-01-18 DIAGNOSIS — Z91018 Allergy to other foods: Secondary | ICD-10-CM | POA: Diagnosis not present

## 2023-01-18 DIAGNOSIS — R051 Acute cough: Secondary | ICD-10-CM | POA: Diagnosis not present

## 2023-03-21 DIAGNOSIS — M65312 Trigger thumb, left thumb: Secondary | ICD-10-CM | POA: Diagnosis not present

## 2023-03-21 DIAGNOSIS — M1611 Unilateral primary osteoarthritis, right hip: Secondary | ICD-10-CM | POA: Diagnosis not present

## 2023-03-21 DIAGNOSIS — M0609 Rheumatoid arthritis without rheumatoid factor, multiple sites: Secondary | ICD-10-CM | POA: Diagnosis not present

## 2023-03-21 DIAGNOSIS — M5126 Other intervertebral disc displacement, lumbar region: Secondary | ICD-10-CM | POA: Diagnosis not present

## 2023-04-06 DIAGNOSIS — M65312 Trigger thumb, left thumb: Secondary | ICD-10-CM | POA: Diagnosis not present

## 2023-04-21 ENCOUNTER — Telehealth: Payer: BC Managed Care – PPO | Admitting: Neurology

## 2023-04-21 ENCOUNTER — Telehealth: Payer: Self-pay | Admitting: Neurology

## 2023-04-21 NOTE — Telephone Encounter (Signed)
Unable to LVM, VM box full. Sent mychart msg informing pt of need to reschedule 04/21/23 appt - MD out

## 2023-05-11 DIAGNOSIS — M65312 Trigger thumb, left thumb: Secondary | ICD-10-CM | POA: Diagnosis not present

## 2023-06-06 DIAGNOSIS — M542 Cervicalgia: Secondary | ICD-10-CM | POA: Diagnosis not present

## 2023-06-20 DIAGNOSIS — M0609 Rheumatoid arthritis without rheumatoid factor, multiple sites: Secondary | ICD-10-CM | POA: Diagnosis not present

## 2024-01-16 ENCOUNTER — Emergency Department (HOSPITAL_COMMUNITY)

## 2024-01-16 ENCOUNTER — Inpatient Hospital Stay (HOSPITAL_COMMUNITY)
Admission: EM | Admit: 2024-01-16 | Discharge: 2024-01-23 | DRG: 329 | Disposition: A | Discharge status: Home / Self Care | Attending: Internal Medicine | Admitting: Internal Medicine

## 2024-01-16 ENCOUNTER — Other Ambulatory Visit: Payer: Self-pay

## 2024-01-16 ENCOUNTER — Encounter: Payer: Self-pay | Admitting: Gastroenterology

## 2024-01-16 DIAGNOSIS — Z79631 Long term (current) use of antimetabolite agent: Secondary | ICD-10-CM

## 2024-01-16 DIAGNOSIS — K381 Appendicular concretions: Secondary | ICD-10-CM | POA: Diagnosis present

## 2024-01-16 DIAGNOSIS — D509 Iron deficiency anemia, unspecified: Secondary | ICD-10-CM | POA: Diagnosis present

## 2024-01-16 DIAGNOSIS — C180 Malignant neoplasm of cecum: Secondary | ICD-10-CM | POA: Diagnosis not present

## 2024-01-16 DIAGNOSIS — E876 Hypokalemia: Secondary | ICD-10-CM | POA: Diagnosis present

## 2024-01-16 DIAGNOSIS — Z1152 Encounter for screening for COVID-19: Secondary | ICD-10-CM

## 2024-01-16 DIAGNOSIS — C189 Malignant neoplasm of colon, unspecified: Secondary | ICD-10-CM | POA: Diagnosis present

## 2024-01-16 DIAGNOSIS — K668 Other specified disorders of peritoneum: Secondary | ICD-10-CM | POA: Diagnosis present

## 2024-01-16 DIAGNOSIS — E872 Acidosis, unspecified: Secondary | ICD-10-CM | POA: Diagnosis present

## 2024-01-16 DIAGNOSIS — D649 Anemia, unspecified: Secondary | ICD-10-CM | POA: Insufficient documentation

## 2024-01-16 DIAGNOSIS — I959 Hypotension, unspecified: Secondary | ICD-10-CM | POA: Diagnosis present

## 2024-01-16 DIAGNOSIS — D849 Immunodeficiency, unspecified: Secondary | ICD-10-CM

## 2024-01-16 DIAGNOSIS — M069 Rheumatoid arthritis, unspecified: Secondary | ICD-10-CM | POA: Diagnosis present

## 2024-01-16 DIAGNOSIS — R110 Nausea: Secondary | ICD-10-CM | POA: Diagnosis present

## 2024-01-16 DIAGNOSIS — A419 Sepsis, unspecified organism: Principal | ICD-10-CM | POA: Diagnosis present

## 2024-01-16 DIAGNOSIS — R1030 Lower abdominal pain, unspecified: Secondary | ICD-10-CM

## 2024-01-16 DIAGNOSIS — R6510 Systemic inflammatory response syndrome (SIRS) of non-infectious origin without acute organ dysfunction: Secondary | ICD-10-CM | POA: Diagnosis not present

## 2024-01-16 DIAGNOSIS — R112 Nausea with vomiting, unspecified: Secondary | ICD-10-CM | POA: Diagnosis present

## 2024-01-16 DIAGNOSIS — R1031 Right lower quadrant pain: Secondary | ICD-10-CM

## 2024-01-16 DIAGNOSIS — D573 Sickle-cell trait: Secondary | ICD-10-CM | POA: Diagnosis present

## 2024-01-16 DIAGNOSIS — J4489 Other specified chronic obstructive pulmonary disease: Secondary | ICD-10-CM | POA: Diagnosis present

## 2024-01-16 DIAGNOSIS — E559 Vitamin D deficiency, unspecified: Secondary | ICD-10-CM | POA: Diagnosis present

## 2024-01-16 DIAGNOSIS — D84821 Immunodeficiency due to drugs: Secondary | ICD-10-CM | POA: Diagnosis present

## 2024-01-16 DIAGNOSIS — Z791 Long term (current) use of non-steroidal anti-inflammatories (NSAID): Secondary | ICD-10-CM

## 2024-01-16 LAB — CBC WITH DIFFERENTIAL/PLATELET
Abs Immature Granulocytes: 0.04 10*3/uL (ref 0.00–0.07)
Basophils Absolute: 0 10*3/uL (ref 0.0–0.1)
Basophils Relative: 0 %
Eosinophils Absolute: 0 10*3/uL (ref 0.0–0.5)
Eosinophils Relative: 0 %
HCT: 31.4 % — ABNORMAL LOW (ref 36.0–46.0)
Hemoglobin: 10.7 g/dL — ABNORMAL LOW (ref 12.0–15.0)
Immature Granulocytes: 0 %
Lymphocytes Relative: 4 %
Lymphs Abs: 0.4 10*3/uL — ABNORMAL LOW (ref 0.7–4.0)
MCH: 29.5 pg (ref 26.0–34.0)
MCHC: 34.1 g/dL (ref 30.0–36.0)
MCV: 86.5 fL (ref 80.0–100.0)
Monocytes Absolute: 0.5 10*3/uL (ref 0.1–1.0)
Monocytes Relative: 5 %
Neutro Abs: 10.1 10*3/uL — ABNORMAL HIGH (ref 1.7–7.7)
Neutrophils Relative %: 91 %
Platelets: 225 10*3/uL (ref 150–400)
RBC: 3.63 MIL/uL — ABNORMAL LOW (ref 3.87–5.11)
RDW: 14.8 % (ref 11.5–15.5)
WBC: 11 10*3/uL — ABNORMAL HIGH (ref 4.0–10.5)
nRBC: 0 % (ref 0.0–0.2)

## 2024-01-16 LAB — I-STAT CHEM 8, ED
BUN: 6 mg/dL (ref 6–20)
Calcium, Ion: 1.03 mmol/L — ABNORMAL LOW (ref 1.15–1.40)
Chloride: 106 mmol/L (ref 98–111)
Creatinine, Ser: 0.8 mg/dL (ref 0.44–1.00)
Glucose, Bld: 122 mg/dL — ABNORMAL HIGH (ref 70–99)
HCT: 31 % — ABNORMAL LOW (ref 36.0–46.0)
Hemoglobin: 10.5 g/dL — ABNORMAL LOW (ref 12.0–15.0)
Potassium: 3.4 mmol/L — ABNORMAL LOW (ref 3.5–5.1)
Sodium: 138 mmol/L (ref 135–145)
TCO2: 20 mmol/L — ABNORMAL LOW (ref 22–32)

## 2024-01-16 LAB — PROTIME-INR
INR: 1.1 (ref 0.8–1.2)
Prothrombin Time: 14.6 s (ref 11.4–15.2)

## 2024-01-16 LAB — I-STAT CG4 LACTIC ACID, ED
Lactic Acid, Venous: 2.8 mmol/L (ref 0.5–1.9)
Lactic Acid, Venous: 1.2 mmol/L (ref 0.5–1.9)

## 2024-01-16 LAB — COMPREHENSIVE METABOLIC PANEL WITH GFR
ALT: 11 U/L (ref 0–44)
AST: 23 U/L (ref 15–41)
Albumin: 4.1 g/dL (ref 3.5–5.0)
Alkaline Phosphatase: 29 U/L — ABNORMAL LOW (ref 38–126)
Anion gap: 15 (ref 5–15)
BUN: 7 mg/dL (ref 6–20)
CO2: 19 mmol/L — ABNORMAL LOW (ref 22–32)
Calcium: 9.3 mg/dL (ref 8.9–10.3)
Chloride: 104 mmol/L (ref 98–111)
Creatinine, Ser: 0.84 mg/dL (ref 0.44–1.00)
GFR, Estimated: >60 mL/min (ref >60)
Glucose, Bld: 123 mg/dL — ABNORMAL HIGH (ref 70–99)
Potassium: 3.5 mmol/L (ref 3.5–5.1)
Sodium: 138 mmol/L (ref 135–145)
Total Bilirubin: 1 mg/dL (ref 0.0–1.2)
Total Protein: 7.4 g/dL (ref 6.5–8.1)

## 2024-01-16 LAB — HCG, SERUM, QUALITATIVE: Preg, Serum: NEGATIVE

## 2024-01-16 LAB — LIPASE, BLOOD: Lipase: 23 U/L (ref 11–51)

## 2024-01-16 MED ORDER — LACTATED RINGERS IV SOLN: INTRAVENOUS | Status: DC

## 2024-01-16 MED ORDER — MORPHINE SULFATE (PF) 4 MG/ML IV SOLN: 4.0000 mg | Freq: Once | INTRAVENOUS | Status: DC

## 2024-01-16 MED ORDER — IOHEXOL 350 MG/ML SOLN
75.0000 mL | Freq: Once | INTRAVENOUS | Status: AC | PRN
  Administered 2024-01-16: 75 mL via INTRAVENOUS

## 2024-01-16 MED ORDER — SODIUM CHLORIDE 0.9 % IV BOLUS
1000.0000 mL | Freq: Once | INTRAVENOUS | Status: AC
  Administered 2024-01-17: 1000 mL via INTRAVENOUS

## 2024-01-16 MED ORDER — METRONIDAZOLE 500 MG/100ML IV SOLN
500.0000 mg | Freq: Once | INTRAVENOUS | Status: AC
  Administered 2024-01-16: 500 mg via INTRAVENOUS
  Filled 2024-01-16: qty 100

## 2024-01-16 MED ORDER — SODIUM CHLORIDE 0.9 % IV SOLN
2.0000 g | Freq: Once | INTRAVENOUS | Status: AC
  Administered 2024-01-16: 2 g via INTRAVENOUS
  Filled 2024-01-16: qty 20

## 2024-01-16 MED ORDER — ONDANSETRON HCL 4 MG/2ML IJ SOLN
4.0000 mg | Freq: Once | INTRAMUSCULAR | Status: AC
  Administered 2024-01-16: 4 mg via INTRAVENOUS
  Filled 2024-01-16: qty 2

## 2024-01-16 MED ORDER — SODIUM CHLORIDE 0.9 % IV BOLUS
1000.0000 mL | Freq: Once | INTRAVENOUS | Status: AC
  Administered 2024-01-16: 1000 mL via INTRAVENOUS

## 2024-01-16 MED ORDER — MORPHINE SULFATE (PF) 4 MG/ML IV SOLN
4.0000 mg | Freq: Once | INTRAVENOUS | Status: AC
  Administered 2024-01-16: 4 mg via INTRAVENOUS
  Filled 2024-01-16: qty 1

## 2024-01-16 MED ORDER — FENTANYL CITRATE PF 50 MCG/ML IJ SOSY
50.0000 ug | PREFILLED_SYRINGE | Freq: Once | INTRAMUSCULAR | Status: AC
  Administered 2024-01-16: 50 ug via INTRAVENOUS
  Filled 2024-01-16: qty 1

## 2024-01-16 MED ORDER — ACETAMINOPHEN 500 MG PO TABS
1000.0000 mg | ORAL_TABLET | Freq: Once | ORAL | Status: AC
  Administered 2024-01-16: 1000 mg via ORAL
  Filled 2024-01-16: qty 2

## 2024-01-16 NOTE — ED Notes (Signed)
 Pt in CT at time of rooming.  CT called and told to bring patient to 1

## 2024-01-16 NOTE — Progress Notes (Signed)
 Elink monitoring for the code sepsis protocol.

## 2024-01-16 NOTE — ED Provider Triage Note (Signed)
 Emergency Medicine Provider Triage Evaluation Note  Sherry Abbott , a 48 y.o. female  was evaluated in triage.  Pt complains of severe abdominal pain and vomiting, diarrhea, recently diagnosed with colon cancer. No fever.   Review of Systems  Positive:  Negative:   Physical Exam  BP (!) 98/56 (BP Location: Right Arm)   Pulse 90   Temp 98.6 F (37 C)   Resp 17   SpO2 96%  Gen:   Awake, appears to be in pain Resp:  Normal effort  MSK:   Moves extremities without difficulty  Other:    Medical Decision Making  Medically screening exam initiated at 5:31 PM.  Appropriate orders placed.  Sherry Abbott was informed that the remainder of the evaluation will be completed by another provider, this initial triage assessment does not replace that evaluation, and the importance of remaining in the ED until their evaluation is complete.     Sherry Eisenmenger, PA-C 01/16/24 1732

## 2024-01-16 NOTE — ED Provider Notes (Signed)
 [place] [facility] Provider Note   CSN: [id number] Arrival date & time: 01/16/24  1711    History  Chief Complaint  Patient presents with   Emesis   Abdominal Pain    Sherry Abbott is a 48 y.o. female here for abdominal pain, nausea and vomiting.  Started on Saturday.  Had a colonoscopy with Dr. Cristina Donath with [facility] on Thursday.  Was told she had a mass and was subsequently diagnosed with adenocarcinoma supposed to see [facility] surgery tomorrow.  On Saturday developed diffuse periumbilical pain.  Thought likely was due to colonoscopy however subsequently migrated to her right lower quadrant.  She had a CT scan on 6 6 which showed calcified structure distal appendix possibly appendicolith with no obvious inflammatory structures.  She came in today due to worsening right lower quadrant abdominal pain as well as persistent nausea and vomiting.  She is on methotrexate for RA.  No other Biologics.  Has had some chills with a documented fever.  He was noted to have low blood pressure in triage.  No chest pain, shortness of breath, cough, back pain, dysuria hematuria.  No pelvic pain, vaginal discharge.  Immunocompromise-methotrexate for RA  HPI     Home Medications Prior to Admission medications   Medication Sig Start Date End Date Taking? Authorizing Provider  B Complex Vitamins (VITAMIN B-COMPLEX) TABS Take 1 tablet by mouth daily at 6 (six) AM. 06/23/20   [provider]  cetirizine (ZYRTEC ALLERGY) 10 MG tablet Take 10 mg by mouth daily.    [provider]  fluticasone (FLONASE) 50 MCG/ACT nasal spray Place 2 sprays into both nostrils daily as needed.    [provider]  folic acid (FOLVITE) 1 MG tablet Take 1 mg by mouth daily.    [provider]  Fremanezumab -vfrm (AJOVY ) 225 MG/1.5ML SOAJ Inject 225 mg into the skin every 30 (thirty) days. Copay card: BIN  [id number] GRP [id number]  ID [id number] PCN:  PDMI expires 07/2023 12/14/22   Glory Larsen, MD  gabapentin (NEURONTIN) 300 MG capsule Take 300 mg by mouth at bedtime.    [provider]  meloxicam (MOBIC) 15 MG tablet Take 15 mg by mouth daily.    [provider]  methotrexate (RHEUMATREX) 2.5 MG tablet Take 10 mg by mouth once a week. 06/30/22   [provider]  montelukast (SINGULAIR) 10 MG tablet Take 10 mg by mouth at bedtime. 06/11/20   [provider]  Multiple Vitamin (MULTIVITAMIN WITH MINERALS) TABS tablet Take 1 tablet by mouth daily.    [provider]  ondansetron  (ZOFRAN -ODT) 4 MG disintegrating tablet Take 1-2 tablets (4-8 mg total) by mouth every 8 (eight) hours as needed. 12/14/22   Glory Larsen, MD  rizatriptan  (MAXALT -MLT) 10 MG disintegrating tablet Take 1 tablet (10 mg total) by mouth as needed for migraine. May repeat in 2 hours if needed 12/14/22   Glory Larsen, MD      Allergies    Neosporin [neomycin-bacitracin zn-polymyx], Shellfish allergy, Sulfa antibiotics, and Betadine [povidone iodine]    Review of Systems   Review of Systems  Constitutional:  Positive for activity change, appetite change, chills and fatigue.  HENT: Negative.    Respiratory: Negative.    Cardiovascular: Negative.   Gastrointestinal:  Positive for anal bleeding, diarrhea, nausea and vomiting.  Genitourinary: Negative.   Musculoskeletal: Negative.   Skin: Negative.   Neurological: Negative.   All other systems reviewed  and are negative.   Physical Exam Updated Vital Signs BP (!) 99/53 (BP Location: Right Arm)   Pulse 92   Temp 98.7 F (37.1 C) (Oral)   Resp 15   Ht 5\' 5"  (1.651 m)   Wt 65.3 kg   SpO2 100%   BMI 23.96 kg/m  Physical Exam Vitals and nursing note reviewed.  Constitutional:      General: She is not in acute distress.    Appearance: She is well-developed. She is ill-appearing.  HENT:     Head: Atraumatic.     Mouth/Throat:     Mouth: Mucous membranes are  moist.  Eyes:     Pupils: Pupils are equal, round, and reactive to light.  Cardiovascular:     Rate and Rhythm: Tachycardia present.     Heart sounds: Normal heart sounds.  Pulmonary:     Effort: Pulmonary effort is normal. No respiratory distress.     Breath sounds: Normal breath sounds.     Comments: Clear bilaterally, speaks in full sentences without difficulty Abdominal:     General: Bowel sounds are normal. There is no distension.     Palpations: Abdomen is soft.     Tenderness: There is abdominal tenderness in the right lower quadrant. There is no right CVA tenderness, left CVA tenderness, guarding or rebound.     Hernia: No hernia is present.  Musculoskeletal:        General: Normal range of motion.     Cervical back: Normal range of motion.  Skin:    General: Skin is warm and dry.  Neurological:     General: No focal deficit present.     Mental Status: She is alert.  Psychiatric:        Mood and Affect: Mood normal.    ED Results / Procedures / Treatments   Labs (all labs ordered are listed, but only abnormal results are displayed) Labs Reviewed  COMPREHENSIVE METABOLIC PANEL WITH GFR - Abnormal; Notable for the following components:      Result Value   CO2 19 (*)    Glucose, Bld 123 (*)    Alkaline Phosphatase 29 (*)    All other components within normal limits  CBC WITH DIFFERENTIAL/PLATELET - Abnormal; Notable for the following components:   WBC 11.0 (*)    RBC 3.63 (*)    Hemoglobin 10.7 (*)    HCT 31.4 (*)    Neutro Abs 10.1 (*)    Lymphs Abs 0.4 (*)    All other components within normal limits  I-STAT CHEM 8, ED - Abnormal; Notable for the following components:   Potassium 3.4 (*)    Glucose, Bld 122 (*)    Calcium, Ion 1.03 (*)    TCO2 20 (*)    Hemoglobin 10.5 (*)    HCT 31.0 (*)    All other components within normal limits  I-STAT CG4 LACTIC ACID, ED - Abnormal; Notable for the following components:   Lactic Acid, Venous 2.8 (*)    All other  components within normal limits  CULTURE, BLOOD (ROUTINE X 2)  CULTURE, BLOOD (ROUTINE X 2)  RESP PANEL BY RT-PCR (RSV, FLU A&B, COVID)  RVPGX2  LIPASE, BLOOD  PROTIME-INR  HCG, SERUM, QUALITATIVE  CBC WITH DIFFERENTIAL/PLATELET  URINALYSIS, ROUTINE W REFLEX MICROSCOPIC  I-STAT CG4 LACTIC ACID, ED    EKG None  Radiology DG Chest Portable 1 View Result Date: 01/16/2024 CLINICAL DATA:  Sepsis EXAM: PORTABLE CHEST 1 VIEW COMPARISON:  CT 06/12/2008  FINDINGS: The heart size and mediastinal contours are within normal limits. Both lungs are clear. The visualized skeletal structures are unremarkable. IMPRESSION: No active disease. Electronically Signed   By: Janeece Mechanic M.D.   On: 01/16/2024 20:58   CT ABDOMEN PELVIS W CONTRAST Result Date: 01/16/2024 CLINICAL DATA:  Abdominal pain, acute, nonlocalized EXAM: CT ABDOMEN AND PELVIS WITH CONTRAST TECHNIQUE: Multidetector CT imaging of the abdomen and pelvis was performed using the standard protocol following bolus administration of intravenous contrast. RADIATION DOSE REDUCTION: This exam was performed according to the departmental dose-optimization program which includes automated exposure control, adjustment of the mA and/or kV according to patient size and/or use of iterative reconstruction technique. CONTRAST:  75mL OMNIPAQUE IOHEXOL 350 MG/ML SOLN COMPARISON:  None Available. FINDINGS: Lower chest: No acute abnormality Hepatobiliary: 2.8 cm cyst in the left hepatic lobe appears simple. No suspicious focal hepatic abnormality. Gallbladder unremarkable. Pancreas: No focal abnormality or ductal dilatation. Spleen: No focal abnormality.  Normal size. Adrenals/Urinary Tract: No adrenal abnormality. No focal renal abnormality. No stones or hydronephrosis. Urinary bladder is unremarkable. Stomach/Bowel: Multiple appendicoliths within the appendix. No appendiceal dilatation or surrounding inflammation. Stomach, large and small bowel grossly unremarkable.  Vascular/Lymphatic: No evidence of aneurysm or adenopathy. Reproductive: Uterus and adnexa unremarkable.  No mass. Other: No free fluid or free air. Rim calcified structure in the right lower quadrant measures 2.5 x 1.3 cm, likely calcified lymph node. Musculoskeletal: No acute bony abnormality. IMPRESSION: No acute findings in the abdomen or pelvis. Electronically Signed   By: Janeece Mechanic M.D.   On: 01/16/2024 20:02    Procedures .Critical Care  Performed by: Dickson Founds, PA-C Authorized by: Dickson Founds, PA-C   Critical care provider statement:    Critical care time (minutes):  35   Critical care was necessary to treat or prevent imminent or life-threatening deterioration of the following conditions:  Sepsis   Critical care was time spent personally by me on the following activities:  Development of treatment plan with patient or surrogate, discussions with consultants, evaluation of patient's response to treatment, examination of patient, ordering and review of laboratory studies, ordering and review of radiographic studies, ordering and performing treatments and interventions, pulse oximetry, re-evaluation of patient's condition and review of old charts     Medications Ordered in ED Medications  sodium chloride 0.9 % bolus 1,000 mL (has no administration in time range)  ondansetron  (ZOFRAN ) injection 4 mg (4 mg Intravenous Given 01/16/24 1824)  fentaNYL (SUBLIMAZE) injection 50 mcg (50 mcg Intravenous Given 01/16/24 1823)  sodium chloride 0.9 % bolus 1,000 mL (0 mLs Intravenous Stopped 01/16/24 2114)  iohexol (OMNIPAQUE) 350 MG/ML injection 75 mL (75 mLs Intravenous Contrast Given 01/16/24 1943)  cefTRIAXone (ROCEPHIN) 2 g in sodium chloride 0.9 % 100 mL IVPB (0 g Intravenous Stopped 01/16/24 2144)  metroNIDAZOLE (FLAGYL) IVPB 500 mg (0 mg Intravenous Stopped 01/16/24 2329)  morphine (PF) 4 MG/ML injection 4 mg (4 mg Intravenous Given 01/16/24 2100)  acetaminophen (TYLENOL) tablet 1,000  mg (1,000 mg Oral Given 01/16/24 2110)  sodium chloride 0.9 % bolus 1,000 mL (0 mLs Intravenous Stopped 01/16/24 2329)   ED Course/ Medical Decision Making/ A&P   48 year old here for evaluation of abdominal pain.  Complex medical history he had a colonoscopy on Thursday subsequently found a mass and diagnosed with adenocarcinoma supposed follow-up with general surgery tomorrow.  Saturday developed abdominal pain had CT scan which showed rim structure to the right lower quadrant as well as appendicolith.  Subsequently developed worsening symptoms including intractable nausea and vomiting over the last 24 hours.  Febrile here to 102.7, tachycardic, tachypneic initially had some soft blood pressures 98 systolic.  Workup started from triage.  No URI symptoms.  Code sepsis called.  Intra-abdominal antibiotics given for likely source  Labs and imaging personally viewed and interpreted:  Lactic acid 2.8--1.2 after IVF Metabolic panel without significant abnormality Lipase 23 CBC leukocytosis 11.0, hemoglobin 10.7 Chest x-ray without significant abnormality  Patient given IV fluids, antiemetics, analgesia, antipyretic  CT abdomen pelvis shows multiple appendicoliths, likely calcified lymph node.  No acute abnormality  Patient reassessed.  Meets sepsis criteria given febrile, tachycardic, tachypneic, intermittent hypotension which resolved.  Unclear source.  Pending urine and US .  Patient reassessed.  Still having some pain to lower abdomen.  Pregnancy test negative.  Will plan ultrasound.  Will need admission for sepsis.  Of note patients urine initially was thrown out by nursing staff and mistake, second urine was sent down to the lab without stickers.  Nursing did note that patient has had some downtrending blood pressures.  Will hold on additional analgesia give additional IV fluids.  Care transferred to [facility], PA-C who will follow-up on urine, ultrasound, reassess and blood pressure                                  Medical Decision Making Amount and/or Complexity of Data Reviewed Independent Historian: spouse External Data Reviewed: labs, radiology, ECG and notes. Labs: ordered. Decision-making details documented in ED Course. Radiology: ordered and independent interpretation performed. Decision-making details documented in ED Course. ECG/medicine tests: ordered and independent interpretation performed. Decision-making details documented in ED Course.  Risk OTC drugs. Prescription drug management. Parenteral controlled substances. Decision regarding hospitalization. Diagnosis or treatment significantly limited by social determinants of health.         Final Clinical Impression(s) / ED Diagnoses Final diagnoses:  Sepsis without acute organ dysfunction, due to unspecified organism [facility])  Lower abdominal pain  Immunocompromised (HCC)  Nausea and vomiting, unspecified vomiting type    Rx / DC Orders ED Discharge Orders     None         Itzae Miralles A, PA-C 01/16/24 2333    Wynetta Heckle, MD 01/17/24 1906

## 2024-01-16 NOTE — ED Triage Notes (Signed)
 Patient c/o vomiting, abdominal pain, diarrhea, since re-starting pantoprazole yesterday.

## 2024-01-17 ENCOUNTER — Encounter (HOSPITAL_COMMUNITY): Payer: Self-pay | Admitting: Internal Medicine

## 2024-01-17 DIAGNOSIS — E876 Hypokalemia: Secondary | ICD-10-CM | POA: Diagnosis present

## 2024-01-17 DIAGNOSIS — D509 Iron deficiency anemia, unspecified: Secondary | ICD-10-CM | POA: Diagnosis present

## 2024-01-17 DIAGNOSIS — A419 Sepsis, unspecified organism: Secondary | ICD-10-CM | POA: Diagnosis present

## 2024-01-17 DIAGNOSIS — M069 Rheumatoid arthritis, unspecified: Secondary | ICD-10-CM | POA: Diagnosis present

## 2024-01-17 DIAGNOSIS — R112 Nausea with vomiting, unspecified: Secondary | ICD-10-CM | POA: Diagnosis not present

## 2024-01-17 DIAGNOSIS — R6510 Systemic inflammatory response syndrome (SIRS) of non-infectious origin without acute organ dysfunction: Secondary | ICD-10-CM | POA: Diagnosis present

## 2024-01-17 DIAGNOSIS — C180 Malignant neoplasm of cecum: Secondary | ICD-10-CM | POA: Diagnosis present

## 2024-01-17 DIAGNOSIS — C189 Malignant neoplasm of colon, unspecified: Secondary | ICD-10-CM | POA: Diagnosis present

## 2024-01-17 DIAGNOSIS — K381 Appendicular concretions: Secondary | ICD-10-CM | POA: Diagnosis present

## 2024-01-17 DIAGNOSIS — D84821 Immunodeficiency due to drugs: Secondary | ICD-10-CM | POA: Diagnosis present

## 2024-01-17 DIAGNOSIS — C182 Malignant neoplasm of ascending colon: Secondary | ICD-10-CM | POA: Diagnosis not present

## 2024-01-17 DIAGNOSIS — D649 Anemia, unspecified: Secondary | ICD-10-CM | POA: Diagnosis not present

## 2024-01-17 DIAGNOSIS — R110 Nausea: Secondary | ICD-10-CM | POA: Diagnosis present

## 2024-01-17 DIAGNOSIS — Z79631 Long term (current) use of antimetabolite agent: Secondary | ICD-10-CM | POA: Diagnosis not present

## 2024-01-17 DIAGNOSIS — R1031 Right lower quadrant pain: Secondary | ICD-10-CM | POA: Diagnosis not present

## 2024-01-17 DIAGNOSIS — E872 Acidosis, unspecified: Secondary | ICD-10-CM | POA: Diagnosis present

## 2024-01-17 DIAGNOSIS — I959 Hypotension, unspecified: Secondary | ICD-10-CM | POA: Diagnosis present

## 2024-01-17 DIAGNOSIS — Z791 Long term (current) use of non-steroidal anti-inflammatories (NSAID): Secondary | ICD-10-CM | POA: Diagnosis not present

## 2024-01-17 DIAGNOSIS — K668 Other specified disorders of peritoneum: Secondary | ICD-10-CM | POA: Diagnosis present

## 2024-01-17 DIAGNOSIS — E559 Vitamin D deficiency, unspecified: Secondary | ICD-10-CM | POA: Diagnosis present

## 2024-01-17 DIAGNOSIS — Z1152 Encounter for screening for COVID-19: Secondary | ICD-10-CM | POA: Diagnosis not present

## 2024-01-17 DIAGNOSIS — J4489 Other specified chronic obstructive pulmonary disease: Secondary | ICD-10-CM | POA: Diagnosis present

## 2024-01-17 DIAGNOSIS — D573 Sickle-cell trait: Secondary | ICD-10-CM | POA: Diagnosis present

## 2024-01-17 HISTORY — DX: Nausea with vomiting, unspecified: R11.2

## 2024-01-17 LAB — CBC
HCT: 26.4 % — ABNORMAL LOW (ref 36.0–46.0)
HCT: 25.6 % — ABNORMAL LOW (ref 36.0–46.0)
HCT: 27 % — ABNORMAL LOW (ref 36.0–46.0)
Hemoglobin: 9 g/dL — ABNORMAL LOW (ref 12.0–15.0)
Hemoglobin: 8.4 g/dL — ABNORMAL LOW (ref 12.0–15.0)
Hemoglobin: 9.2 g/dL — ABNORMAL LOW (ref 12.0–15.0)
MCH: 29.6 pg (ref 26.0–34.0)
MCH: 29.2 pg (ref 26.0–34.0)
MCH: 29.9 pg (ref 26.0–34.0)
MCHC: 34.1 g/dL (ref 30.0–36.0)
MCHC: 32.8 g/dL (ref 30.0–36.0)
MCHC: 34.1 g/dL (ref 30.0–36.0)
MCV: 86.8 fL (ref 80.0–100.0)
MCV: 88.9 fL (ref 80.0–100.0)
MCV: 87.7 fL (ref 80.0–100.0)
Platelets: 175 10*3/uL (ref 150–400)
Platelets: 147 10*3/uL — ABNORMAL LOW (ref 150–400)
Platelets: 169 10*3/uL (ref 150–400)
RBC: 3.04 MIL/uL — ABNORMAL LOW (ref 3.87–5.11)
RBC: 2.88 MIL/uL — ABNORMAL LOW (ref 3.87–5.11)
RBC: 3.08 MIL/uL — ABNORMAL LOW (ref 3.87–5.11)
RDW: 15.3 % (ref 11.5–15.5)
RDW: 15.5 % (ref 11.5–15.5)
RDW: 15.5 % (ref 11.5–15.5)
WBC: 9.5 10*3/uL (ref 4.0–10.5)
WBC: 7 10*3/uL (ref 4.0–10.5)
WBC: 6.6 10*3/uL (ref 4.0–10.5)
nRBC: 0 % (ref 0.0–0.2)
nRBC: 0 % (ref 0.0–0.2)
nRBC: 0 % (ref 0.0–0.2)

## 2024-01-17 LAB — ABO/RH: ABO/RH(D): O POS

## 2024-01-17 LAB — URINALYSIS, ROUTINE W REFLEX MICROSCOPIC
Bilirubin Urine: NEGATIVE
Glucose, UA: NEGATIVE mg/dL
Hgb urine dipstick: NEGATIVE
Ketones, ur: 5 mg/dL — AB
Leukocytes,Ua: NEGATIVE
Nitrite: NEGATIVE
Protein, ur: NEGATIVE mg/dL
Specific Gravity, Urine: 1.028 (ref 1.005–1.030)
pH: 5 (ref 5.0–8.0)

## 2024-01-17 LAB — RESP PANEL BY RT-PCR (RSV, FLU A&B, COVID)  RVPGX2
Influenza A by PCR: NEGATIVE
Influenza B by PCR: NEGATIVE
Resp Syncytial Virus by PCR: NEGATIVE
SARS Coronavirus 2 by RT PCR: NEGATIVE

## 2024-01-17 LAB — BASIC METABOLIC PANEL WITH GFR
Anion gap: 7 (ref 5–15)
BUN: 5 mg/dL — ABNORMAL LOW (ref 6–20)
CO2: 22 mmol/L (ref 22–32)
Calcium: 7.4 mg/dL — ABNORMAL LOW (ref 8.9–10.3)
Chloride: 109 mmol/L (ref 98–111)
Creatinine, Ser: 0.84 mg/dL (ref 0.44–1.00)
GFR, Estimated: >60 mL/min (ref >60)
Glucose, Bld: 98 mg/dL (ref 70–99)
Potassium: 3.4 mmol/L — ABNORMAL LOW (ref 3.5–5.1)
Sodium: 138 mmol/L (ref 135–145)

## 2024-01-17 LAB — TYPE AND SCREEN
ABO/RH(D): O POS
Antibody Screen: NEGATIVE

## 2024-01-17 LAB — HIV ANTIBODY (ROUTINE TESTING W REFLEX): HIV Screen 4th Generation wRfx: NONREACTIVE

## 2024-01-17 LAB — PROCALCITONIN: Procalcitonin: 0.62 ng/mL

## 2024-01-17 LAB — MAGNESIUM: Magnesium: 1.7 mg/dL (ref 1.7–2.4)

## 2024-01-17 MED ORDER — VANCOMYCIN HCL 1.5 G IV SOLR
1500.0000 mg | Freq: Once | INTRAVENOUS | Status: DC
  Filled 2024-01-17: qty 30

## 2024-01-17 MED ORDER — ACETAMINOPHEN 325 MG PO TABS
650.0000 mg | ORAL_TABLET | Freq: Four times a day (QID) | ORAL | Status: DC | PRN
  Administered 2024-01-17 – 2024-01-18 (×3): 650 mg via ORAL
  Filled 2024-01-17 (×3): qty 2

## 2024-01-17 MED ORDER — SODIUM CHLORIDE 0.9 % IV SOLN: INTRAVENOUS | Status: DC

## 2024-01-17 MED ORDER — METRONIDAZOLE 500 MG/100ML IV SOLN
500.0000 mg | Freq: Two times a day (BID) | INTRAVENOUS | Status: DC
  Administered 2024-01-17 – 2024-01-19 (×5): 500 mg via INTRAVENOUS
  Filled 2024-01-17 (×5): qty 100

## 2024-01-17 MED ORDER — FENTANYL CITRATE PF 50 MCG/ML IJ SOSY
12.5000 ug | PREFILLED_SYRINGE | INTRAMUSCULAR | Status: DC | PRN
  Administered 2024-01-17: 12.5 ug via INTRAVENOUS
  Filled 2024-01-17: qty 1

## 2024-01-17 MED ORDER — ENSURE PRE-SURGERY PO LIQD
296.0000 mL | Freq: Once | ORAL | Status: AC
  Administered 2024-01-18: 296 mL via ORAL
  Filled 2024-01-17: qty 296

## 2024-01-17 MED ORDER — ENSURE PRE-SURGERY PO LIQD
592.0000 mL | Freq: Once | ORAL | Status: AC
  Administered 2024-01-17: 592 mL via ORAL
  Filled 2024-01-17 (×2): qty 592

## 2024-01-17 MED ORDER — ACETAMINOPHEN 650 MG RE SUPP
650.0000 mg | Freq: Four times a day (QID) | RECTAL | Status: AC | PRN
Start: 2024-01-17 — End: ?
  Filled 2024-01-17: qty 1

## 2024-01-17 MED ORDER — VANCOMYCIN HCL 750 MG/150ML IV SOLN
750.0000 mg | Freq: Two times a day (BID) | INTRAVENOUS | Status: DC
  Filled 2024-01-17: qty 150

## 2024-01-17 MED ORDER — MAGNESIUM SULFATE 2 GM/50ML IV SOLN
2.0000 g | Freq: Once | INTRAVENOUS | Status: AC
  Administered 2024-01-17: 2 g via INTRAVENOUS
  Filled 2024-01-17: qty 50

## 2024-01-17 MED ORDER — VANCOMYCIN HCL 1500 MG/300ML IV SOLN
1500.0000 mg | Freq: Two times a day (BID) | INTRAVENOUS | Status: DC
  Filled 2024-01-17: qty 300

## 2024-01-17 MED ORDER — POLYETHYLENE GLYCOL 3350 17 GM/SCOOP PO POWD
238.0000 g | Freq: Once | ORAL | Status: DC
  Filled 2024-01-17: qty 238

## 2024-01-17 MED ORDER — POLYETHYLENE GLYCOL 3350 17 GM/SCOOP PO POWD
238.0000 g | Freq: Once | ORAL | Status: AC
  Administered 2024-01-17: 238 g via ORAL

## 2024-01-17 MED ORDER — CEFOTETAN DISODIUM 2 G IJ SOLR: 2.0000 g | INTRAMUSCULAR | Status: DC

## 2024-01-17 MED ORDER — VANCOMYCIN HCL IN DEXTROSE 750-5 MG/150ML-% IV SOLN
750.0000 mg | Freq: Two times a day (BID) | INTRAVENOUS | Status: DC
  Filled 2024-01-17: qty 150

## 2024-01-17 MED ORDER — KCL IN DEXTROSE-NACL 20-5-0.9 MEQ/L-%-% IV SOLN
INTRAVENOUS | Status: AC
  Filled 2024-01-17 (×3): qty 1000

## 2024-01-17 MED ORDER — ENOXAPARIN SODIUM 40 MG/0.4ML IJ SOSY: 40.0000 mg | PREFILLED_SYRINGE | Freq: Once | INTRAMUSCULAR | Status: DC

## 2024-01-17 MED ORDER — PANTOPRAZOLE SODIUM 40 MG IV SOLR
40.0000 mg | INTRAVENOUS | Status: DC
  Administered 2024-01-17 – 2024-01-20 (×2): 40 mg via INTRAVENOUS
  Filled 2024-01-17 (×5): qty 10

## 2024-01-17 MED ORDER — ONDANSETRON HCL 4 MG/2ML IJ SOLN
4.0000 mg | Freq: Four times a day (QID) | INTRAMUSCULAR | Status: DC | PRN
  Administered 2024-01-18 – 2024-01-22 (×4): 4 mg via INTRAVENOUS
  Filled 2024-01-17 (×4): qty 2

## 2024-01-17 MED ORDER — ALVIMOPAN 12 MG PO CAPS
12.0000 mg | ORAL_CAPSULE | ORAL | Status: AC
Start: 2024-01-18 — End: 2024-01-19
  Filled 2024-01-17: qty 1

## 2024-01-17 MED ORDER — METRONIDAZOLE 500 MG/100ML IV SOLN
500.0000 mg | Freq: Two times a day (BID) | INTRAVENOUS | Status: AC
  Administered 2024-01-17: 500 mg via INTRAVENOUS
  Filled 2024-01-17: qty 100

## 2024-01-17 MED ORDER — POTASSIUM CHLORIDE 10 MEQ/100ML IV SOLN
10.0000 meq | INTRAVENOUS | Status: AC
  Administered 2024-01-17 (×4): 10 meq via INTRAVENOUS
  Filled 2024-01-17 (×4): qty 100

## 2024-01-17 MED ORDER — NALOXONE HCL 0.4 MG/ML IJ SOLN: 0.4000 mg | INTRAMUSCULAR | Status: DC | PRN

## 2024-01-17 MED ORDER — SODIUM CHLORIDE 0.9 % IV BOLUS
1000.0000 mL | Freq: Once | INTRAVENOUS | Status: AC
  Administered 2024-01-17: 1000 mL via INTRAVENOUS

## 2024-01-17 MED ORDER — SODIUM CHLORIDE 0.9 % IV BOLUS
500.0000 mL | Freq: Once | INTRAVENOUS | Status: AC
  Administered 2024-01-17: 500 mL via INTRAVENOUS

## 2024-01-17 MED ORDER — SODIUM CHLORIDE 0.9 % IV SOLN
2.0000 g | Freq: Three times a day (TID) | INTRAVENOUS | Status: DC
  Administered 2024-01-17 – 2024-01-19 (×7): 2 g via INTRAVENOUS
  Filled 2024-01-17 (×7): qty 12.5

## 2024-01-17 NOTE — Plan of Care (Signed)

## 2024-01-17 NOTE — Consult Note (Signed)
 Reason for Consult:RLQ abdominal pain Referring Physician: Dr. Arthurine Lass Sherry Abbott is an 48 y.o. female.  HPI: 48yo F with PMHx RA on methotrexate underwent a colonoscopy by Dr. Feliberto Hopping last Thursday and was told she had a mass in her cecum.  This was biopsied and showed adenocarcinoma.  The patient reports she had a CT chest abdomen pelvis at [facility] on 6/6 to rule out any metastatic disease.  This did not show metastasis per her report but did showed some appendicoliths.  Sunday, she developed some generalized abdominal pain associated with nausea and vomiting.  The pain became more localized in her lower abdomen and right lower quadrant and she came to the emergency department.  Workup here demonstrated fever, white blood cell count 11,000, hypotension, and lactate 2.8. CT scan of the abdomen pelvis was done which did show some appendicoliths but no evidence of inflammatory changes in the right lower quadrant or elsewhere.  She is being treated on the sepsis protocol and has been admitted by [facility]. Of note she was scheduled to see Dr. Ramiro Burly from my practice today regarding the cecal adenocarcinoma.  Past Medical History:  Diagnosis Date   Allergic rhinitis    Allergic to food    blueberry   Allergic to shellfish    Asthma    Dermatographic urticaria    Hidradenitis suppurativa    Labral tear of right hip joint    surgery   Low back pain    Other chronic allergic conjunctivitis    Polyarthralgia     Past Surgical History:  Procedure Laterality Date   CESAREAN SECTION     ROTATOR CUFF REPAIR Right    2015    Family History  Problem Relation Age of Onset   Migraines Mother    Pulmonary disease Mother    Cancer Father     Social History:  reports that she has never smoked. She has never used smokeless tobacco. She reports that she does not drink alcohol and does not use drugs.  Allergies:  Allergies  Allergen Reactions   Neosporin [Neomycin-Bacitracin Zn-Polymyx] Other  (See Comments)    Blistering rash   Shellfish Allergy Anaphylaxis   Sulfa Antibiotics Rash   Blueberry [Vaccinium Angustifolium] Itching    Bloating, "avoided all together"   Lactose Diarrhea and Itching    Bloating, "avoids all dairy"   Betadine [Povidone Iodine] Rash    Medications: I have reviewed the patient's current medications.  Results for orders placed or performed during the hospital encounter of 01/16/24 (from the past 48 hours)  Comprehensive metabolic panel     Status: Abnormal   Collection Time: 01/16/24  6:09 PM  Result Value Ref Range   Sodium 138 135 - 145 mmol/L   Potassium 3.5 3.5 - 5.1 mmol/L   Chloride 104 98 - 111 mmol/L   CO2 19 (L) 22 - 32 mmol/L   Glucose, Bld 123 (H) 70 - 99 mg/dL    Comment: Glucose reference range applies only to samples taken after fasting for at least 8 hours.   BUN 7 6 - 20 mg/dL   Creatinine, Ser 1.61 0.44 - 1.00 mg/dL   Calcium 9.3 8.9 - 09.6 mg/dL   Total Protein 7.4 6.5 - 8.1 g/dL   Albumin 4.1 3.5 - 5.0 g/dL   AST 23 15 - 41 U/L   ALT 11 0 - 44 U/L   Alkaline Phosphatase 29 (L) 38 - 126 U/L   Total Bilirubin 1.0 0.0 - 1.2  mg/dL   GFR, Estimated >28 >41 mL/min    Comment: (NOTE) Calculated using the CKD-EPI Creatinine Equation (2021)    Anion gap 15 5 - 15    Comment: Performed at [facility], [address] [place], [place] [address]  Lipase, blood     Status: None   Collection Time: 01/16/24  6:09 PM  Result Value Ref Range   Lipase 23 11 - 51 U/L    Comment: Performed at [facility], [address], [place], [place] [address]  I-stat chem 8, ED     Status: Abnormal   Collection Time: 01/16/24  6:15 PM  Result Value Ref Range   Sodium 138 135 - 145 mmol/L   Potassium 3.4 (L) 3.5 - 5.1 mmol/L   Chloride 106 98 - 111 mmol/L   BUN 6 6 - 20 mg/dL   Creatinine, Ser 5.36 0.44 - 1.00 mg/dL   Glucose, Bld 644 (H) 70 - 99 mg/dL    Comment: Glucose reference range applies only to samples taken after fasting  for at least 8 hours.   Calcium, Ion 1.03 (L) 1.15 - 1.40 mmol/L   TCO2 20 (L) 22 - 32 mmol/L   Hemoglobin 10.5 (L) 12.0 - 15.0 g/dL   HCT 03.4 (L) 74.2 - 59.5 %  I-Stat Lactic Acid     Status: Abnormal   Collection Time: 01/16/24  6:19 PM  Result Value Ref Range   Lactic Acid, Venous 2.8 (HH) 0.5 - 1.9 mmol/L   Comment NOTIFIED PHYSICIAN   CBC with Differential/Platelet     Status: Abnormal   Collection Time: 01/16/24  8:06 PM  Result Value Ref Range   WBC 11.0 (H) 4.0 - 10.5 K/uL   RBC 3.63 (L) 3.87 - 5.11 MIL/uL   Hemoglobin 10.7 (L) 12.0 - 15.0 g/dL   HCT 63.8 (L) 75.6 - 43.3 %   MCV 86.5 80.0 - 100.0 fL   MCH 29.5 26.0 - 34.0 pg   MCHC 34.1 30.0 - 36.0 g/dL   RDW 29.5 18.8 - 41.6 %   Platelets 225 150 - 400 K/uL   nRBC 0.0 0.0 - 0.2 %   Neutrophils Relative % 91 %   Neutro Abs 10.1 (H) 1.7 - 7.7 K/uL   Lymphocytes Relative 4 %   Lymphs Abs 0.4 (L) 0.7 - 4.0 K/uL   Monocytes Relative 5 %   Monocytes Absolute 0.5 0.1 - 1.0 K/uL   Eosinophils Relative 0 %   Eosinophils Absolute 0.0 0.0 - 0.5 K/uL   Basophils Relative 0 %   Basophils Absolute 0.0 0.0 - 0.1 K/uL   Immature Granulocytes 0 %   Abs Immature Granulocytes 0.04 0.00 - 0.07 K/uL    Comment: Performed at [facility], [address], [place], [place] [address]  Resp panel by RT-PCR (RSV, Flu A&B, Covid) Anterior Nasal Swab     Status: None   Collection Time: 01/16/24  9:49 PM   Specimen: Anterior Nasal Swab  Result Value Ref Range   SARS Coronavirus 2 by RT PCR NEGATIVE NEGATIVE   Influenza A by PCR NEGATIVE NEGATIVE   Influenza B by PCR NEGATIVE NEGATIVE    Comment: (NOTE) The Xpert Xpress SARS-CoV-2/FLU/RSV plus assay is intended as an aid in the diagnosis of influenza from Nasopharyngeal swab specimens and should not be used as a sole basis for treatment. Nasal washings and aspirates are unacceptable for Xpert Xpress SARS-CoV-2/FLU/RSV testing.  Fact Sheet for  Patients:  BloggerCourse.com  Fact Sheet for Healthcare Providers: SeriousBroker.it  This test is not yet approved or cleared by the [place]  FDA and has been authorized for detection and/or diagnosis of SARS-CoV-2 by FDA under an Emergency Use Authorization (EUA). This EUA will remain in effect (meaning this test can be used) for the duration of the COVID-19 declaration under Section 564(b)(1) of the Act, 21 U.S.C. section 360bbb-3(b)(1), unless the authorization is terminated or revoked.     Resp Syncytial Virus by PCR NEGATIVE NEGATIVE    Comment: (NOTE) Fact Sheet for Patients: BloggerCourse.com  Fact Sheet for Healthcare Providers: SeriousBroker.it  This test is not yet approved or cleared by the [place]  FDA and has been authorized for detection and/or diagnosis of SARS-CoV-2 by FDA under an Emergency Use Authorization (EUA). This EUA will remain in effect (meaning this test can be used) for the duration of the COVID-19 declaration under Section 564(b)(1) of the Act, 21 U.S.C. section 360bbb-3(b)(1), unless the authorization is terminated or revoked.  Performed at [facility], [address], [place], [place] [address]   Protime-INR     Status: None   Collection Time: 01/16/24  9:49 PM  Result Value Ref Range   Prothrombin Time 14.6 11.4 - 15.2 seconds   INR 1.1 0.8 - 1.2    Comment: (NOTE) INR goal varies based on device and disease states. Performed at [facility], [address], [place], [place] [address]   hCG, serum, qualitative     Status: None   Collection Time: 01/16/24  9:49 PM  Result Value Ref Range   Preg, Serum NEGATIVE NEGATIVE    Comment:        THE SENSITIVITY OF THIS METHODOLOGY IS >10 mIU/mL. Performed at [facility], [address], [place], [place] [address]   I-Stat Lactic Acid     Status: None   Collection  Time: 01/16/24  9:57 PM  Result Value Ref Range   Lactic Acid, Venous 1.2 0.5 - 1.9 mmol/L  Urinalysis, Routine w reflex microscopic -Urine, Clean Catch     Status: Abnormal   Collection Time: 01/17/24  1:50 AM  Result Value Ref Range   Color, Urine YELLOW YELLOW   APPearance CLEAR CLEAR   Specific Gravity, Urine 1.028 1.005 - 1.030   pH 5.0 5.0 - 8.0   Glucose, UA NEGATIVE NEGATIVE mg/dL   Hgb urine dipstick NEGATIVE NEGATIVE   Bilirubin Urine NEGATIVE NEGATIVE   Ketones, ur 5 (A) NEGATIVE mg/dL   Protein, ur NEGATIVE NEGATIVE mg/dL   Nitrite NEGATIVE NEGATIVE   Leukocytes,Ua NEGATIVE NEGATIVE    Comment: Performed at [facility], [address] [place], [place] [address]    US  PELVIC COMPLETE W TRANSVAGINAL AND TORSION R/O Result Date: 01/16/2024 CLINICAL DATA:  Pelvic pain EXAM: TRANSABDOMINAL AND TRANSVAGINAL ULTRASOUND OF PELVIS DOPPLER ULTRASOUND OF OVARIES TECHNIQUE: Both transabdominal and transvaginal ultrasound examinations of the pelvis were performed. Transabdominal technique was performed for global imaging of the pelvis including uterus, ovaries, adnexal regions, and pelvic cul-de-sac. It was necessary to proceed with endovaginal exam following the transabdominal exam to visualize the ovaries. Color and duplex Doppler ultrasound was utilized to evaluate blood flow to the ovaries. COMPARISON:  CT from earlier in the same day. FINDINGS: Uterus Measurements: 8.9 x 3.5 x 4.3 cm. = volume: 69 mL. No fibroids or other mass visualized. Endometrium Thickness: 4 mm.  No focal abnormality visualized. Right ovary Measurements: 0.3 x 2.9 x 4.2  cm. = volume: 21 mL. Normal appearance/no adnexal mass. Left ovary Measurements: 2.5 x 1.5 x 2.1 cm. = volume: 4.2 mL. Normal appearance/no adnexal mass. Pulsed Doppler evaluation of both ovaries demonstrates normal low-resistance arterial and venous waveforms. Other findings Trace free fluid is noted within the pelvis likely physiologic in  nature. IMPRESSION: No acute abnormality noted. Electronically Signed   By: Violeta Grey M.D.   On: 01/16/2024 23:59   DG Chest Portable 1 View Result Date: 01/16/2024 CLINICAL DATA:  Sepsis EXAM: PORTABLE CHEST 1 VIEW COMPARISON:  CT 06/12/2008 FINDINGS: The heart size and mediastinal contours are within normal limits. Both lungs are clear. The visualized skeletal structures are unremarkable. IMPRESSION: No active disease. Electronically Signed   By: Janeece Mechanic M.D.   On: 01/16/2024 20:58   CT ABDOMEN PELVIS W CONTRAST Result Date: 01/16/2024 CLINICAL DATA:  Abdominal pain, acute, nonlocalized EXAM: CT ABDOMEN AND PELVIS WITH CONTRAST TECHNIQUE: Multidetector CT imaging of the abdomen and pelvis was performed using the standard protocol following bolus administration of intravenous contrast. RADIATION DOSE REDUCTION: This exam was performed according to the departmental dose-optimization program which includes automated exposure control, adjustment of the mA and/or kV according to patient size and/or use of iterative reconstruction technique. CONTRAST:  75mL OMNIPAQUE IOHEXOL 350 MG/ML SOLN COMPARISON:  None Available. FINDINGS: Lower chest: No acute abnormality Hepatobiliary: 2.8 cm cyst in the left hepatic lobe appears simple. No suspicious focal hepatic abnormality. Gallbladder unremarkable. Pancreas: No focal abnormality or ductal dilatation. Spleen: No focal abnormality.  Normal size. Adrenals/Urinary Tract: No adrenal abnormality. No focal renal abnormality. No stones or hydronephrosis. Urinary bladder is unremarkable. Stomach/Bowel: Multiple appendicoliths within the appendix. No appendiceal dilatation or surrounding inflammation. Stomach, large and small bowel grossly unremarkable. Vascular/Lymphatic: No evidence of aneurysm or adenopathy. Reproductive: Uterus and adnexa unremarkable.  No mass. Other: No free fluid or free air. Rim calcified structure in the right lower quadrant measures 2.5 x 1.3 cm,  likely calcified lymph node. Musculoskeletal: No acute bony abnormality. IMPRESSION: No acute findings in the abdomen or pelvis. Electronically Signed   By: Janeece Mechanic M.D.   On: 01/16/2024 20:02    Review of Systems  Constitutional:  Positive for appetite change.  HENT: Negative.    Eyes: Negative.   Respiratory: Negative.    Cardiovascular: Negative.   Gastrointestinal:  Positive for abdominal pain, nausea and vomiting.  Endocrine: Negative.   Genitourinary: Negative.   Musculoskeletal:  Positive for arthralgias.  Skin: Negative.   Allergic/Immunologic: Negative.   Neurological: Negative.   Hematological: Negative.   Psychiatric/Behavioral: Negative.     Blood pressure (!) 94/58, pulse 64, temperature 98.4 F (36.9 C), temperature source Oral, resp. rate 14, height 5\' 5"  (1.651 m), weight 65.3 kg, SpO2 100%. Physical Exam Eyes:     Pupils: Pupils are equal, round, and reactive to light.  Cardiovascular:     Rate and Rhythm: Normal rate and regular rhythm.  Pulmonary:     Effort: Pulmonary effort is normal.     Breath sounds: Normal breath sounds. No wheezing.  Abdominal:     Palpations: Abdomen is soft.     Tenderness: There is abdominal tenderness in the right lower quadrant and suprapubic area. There is no guarding or rebound.  Skin:    General: Skin is dry.     Capillary Refill: Capillary refill takes 2 to 3 seconds.  Neurological:     Mental Status: She is alert and oriented to person, place, and time.  Psychiatric:  Mood and Affect: Mood normal.     Assessment/Plan: Recent diagnosis of cecal adenocarcinoma Abdominal pain, nausea and vomiting CT scan shows appendix with appendicoliths but without acute findings  Agree with medical admission for IV antibiotics, n.p.o.  I will discuss further with my partners.  We will plan serial abdominal exams for now.    Sherry Abbott 01/17/2024, 6:08 AM

## 2024-01-17 NOTE — Progress Notes (Signed)
 Patient examined this afternoon. She has persistent RLQ tenderness to palpation. Imaging reviewed, as well as colonoscopy report showing a large cecal mass. Imaging does not suggest appendicitis, but as patient has RLQ pain and known cecal mass, will proceed with lap-assisted right hemicolectomy this admission. Ok for clear liquids today, and will begin miralax bowel prep this evening. Plan for surgery tomorrow. I reviewed the benefits and risks with the patient, including the risks of bleeding, infection, and anastomotic leak. We also reviewed the anticipated postoperative recovery course. All questions were answered.

## 2024-01-17 NOTE — H&P (Signed)
 History and Physical    Sherry Abbott XBJ:[id number] DOB: [birth year] DOA: 01/16/2024  PCP: Sun, Vyvyan, MD  Patient coming from: Home  Chief Complaint: Abdominal pain  HPI: Sherry Abbott is a 48 y.o. female with medical history significant of asthma, allergic rhinitis, hidradenitis suppurativa, chronic migraines, rheumatoid arthritis on methotrexate presenting with complaints of abdominal pain, nausea, and vomiting.  Patient states she had a routine colonoscopy done by Dr. Feliberto Hopping at [facility] 5 days and was told that a mass was seen in her cecum and biopsy confirmed adenocarcinoma.  She was supposed to see Dr. Ramiro Burly with general surgery today to discuss surgical options.  However, 3 days ago she started having severe right lower quadrant abdominal pain, nausea, and vomiting.  She has not been able to tolerate p.o. intake at home.  She was told that she had a fever when she came into the ED.  Denies cough, shortness breath, chest pain, or urinary symptoms.  ED Course: Febrile, tachycardic, and hypotensive.  Labs notable for WBC count 11.0, hemoglobin 10.7, bicarb 19, glucose 123, creatinine 0.8, no elevation of lipase or LFTs, lactic acid 2.8> 1.2, blood cultures in process, COVID/influenza/RSV PCR negative, serum hCG negative, UA not suggestive of infection.  CT abdomen pelvis negative for acute findings.  Chest x-ray showing no active disease.  Pelvic ultrasound showing no acute abnormality.  EKG showing sinus rhythm and no STEMI.  Patient was given Tylenol, fentanyl, morphine, Zofran , ceftriaxone, metronidazole, and 3 L normal saline boluses.  Review of Systems:  Review of Systems  All other systems reviewed and are negative.   Past Medical History:  Diagnosis Date   Allergic rhinitis    Allergic to food    blueberry   Allergic to shellfish    Asthma    Dermatographic urticaria    Hidradenitis suppurativa    Labral tear of right hip joint    surgery   Low back pain     Other chronic allergic conjunctivitis    Polyarthralgia     Past Surgical History:  Procedure Laterality Date   CESAREAN SECTION     ROTATOR CUFF REPAIR Right    2015     reports that she has never smoked. She has never used smokeless tobacco. She reports that she does not drink alcohol and does not use drugs.  Allergies  Allergen Reactions   Neosporin [Neomycin-Bacitracin Zn-Polymyx] Other (See Comments)    Blistering rash   Shellfish Allergy Anaphylaxis   Sulfa Antibiotics Rash   Blueberry [Vaccinium Angustifolium] Itching    Bloating, "avoided all together"   Lactose Diarrhea and Itching    Bloating, "avoids all dairy"   Betadine [Povidone Iodine] Rash    Family History  Problem Relation Age of Onset   Migraines Mother    Pulmonary disease Mother    Cancer Father     Prior to Admission medications   Medication Sig Start Date End Date Taking? Authorizing Provider  B Complex Vitamins (VITAMIN B-COMPLEX) TABS Take 1 tablet by mouth daily at 6 (six) AM. 06/23/20  Yes [provider]  cetirizine (ZYRTEC ALLERGY) 10 MG tablet Take 10 mg by mouth daily.   Yes [provider]  Fluocinolone Acetonide Scalp 0.01 % OIL Apply 1 application  topically once a week. Apply to scalp and neck   Yes [provider]  fluticasone (FLONASE) 50 MCG/ACT nasal spray Place 2 sprays into both nostrils daily as needed.   Yes [provider]  folic acid (  FOLVITE) 1 MG tablet Take 1 mg by mouth daily.   Yes [provider]  methotrexate (RHEUMATREX) 2.5 MG tablet Take 15 mg by mouth every Friday. 6 tablets 06/30/22  Yes [provider]  montelukast (SINGULAIR) 10 MG tablet Take 10 mg by mouth at bedtime. 06/11/20  Yes [provider]  Multiple Vitamin (MULTIVITAMIN WITH MINERALS) TABS tablet Take 1 tablet by mouth daily.   Yes [provider]  pantoprazole (PROTONIX) 20 MG tablet Take 20 mg by mouth 2 (two) times daily. 12/29/23   Yes [provider]  EPINEPHrine 0.3 mg/0.3 mL IJ SOAJ injection Inject 0.3 mg into the muscle as needed for anaphylaxis. Patient not taking: Reported on 01/17/2024 12/06/23 12/05/24  [provider]  Fremanezumab -vfrm (AJOVY ) 225 MG/1.5ML SOAJ Inject 225 mg into the skin every 30 (thirty) days. Copay card: BIN  [id number] GRP [id number]  ID [id number] PCN: PDMI expires 07/2023 Patient not taking: Reported on 01/17/2024 12/14/22   Glory Larsen, MD  rizatriptan  (MAXALT -MLT) 10 MG disintegrating tablet Take 1 tablet (10 mg total) by mouth as needed for migraine. May repeat in 2 hours if needed Patient not taking: Reported on 01/17/2024 12/14/22   Glory Larsen, MD    Physical Exam: Vitals:   01/17/24 0230 01/17/24 0300 01/17/24 0330 01/17/24 0338  BP: 103/65 (!) 73/41 (!) 69/37 (!) 86/60  Pulse: 76 69 72 85  Resp: 13 13 15 15   Temp:      TempSrc:      SpO2: 100% 100% 100% 100%  Weight:      Height:        Physical Exam Vitals reviewed.  Constitutional:      General: She is not in acute distress.    Appearance: She is ill-appearing.  HENT:     Head: Normocephalic and atraumatic.  Eyes:     Extraocular Movements: Extraocular movements intact.  Cardiovascular:     Rate and Rhythm: Normal rate and regular rhythm.     Pulses: Normal pulses.  Pulmonary:     Effort: Pulmonary effort is normal. No respiratory distress.     Breath sounds: Normal breath sounds. No wheezing or rales.  Abdominal:     General: Bowel sounds are normal. There is no distension.     Palpations: Abdomen is soft.     Tenderness: There is abdominal tenderness. There is guarding.     Comments: Right lower quadrant tender to palpation with guarding  Musculoskeletal:     Cervical back: Normal range of motion.     Right lower leg: No edema.     Left lower leg: No edema.  Skin:    General: Skin is warm and dry.  Neurological:     General: No focal deficit present.     Mental Status: She is alert and  oriented to person, place, and time.     Labs on Admission: I have personally reviewed following labs and imaging studies  CBC: Recent Labs  Lab 01/16/24 1815 01/16/24 2006  WBC  --  11.0*  NEUTROABS  --  10.1*  HGB 10.5* 10.7*  HCT 31.0* 31.4*  MCV  --  86.5  PLT  --  225   Basic Metabolic Panel: Recent Labs  Lab 01/16/24 1809 01/16/24 1815  NA 138 138  K 3.5 3.4*  CL 104 106  CO2 19*  --   GLUCOSE 123* 122*  BUN 7 6  CREATININE 0.84 0.80  CALCIUM 9.3  --  GFR: Estimated Creatinine Clearance: 76.5 mL/min (by C-G formula based on SCr of 0.8 mg/dL). Liver Function Tests: Recent Labs  Lab 01/16/24 1809  AST 23  ALT 11  ALKPHOS 29*  BILITOT 1.0  PROT 7.4  ALBUMIN 4.1   Recent Labs  Lab 01/16/24 1809  LIPASE 23   No results for input(s): "AMMONIA" in the last 168 hours. Coagulation Profile: Recent Labs  Lab 01/16/24 2149  INR 1.1   Cardiac Enzymes: No results for input(s): "CKTOTAL", "CKMB", "CKMBINDEX", "TROPONINI" in the last 168 hours. BNP (last 3 results) No results for input(s): "PROBNP" in the last 8760 hours. HbA1C: No results for input(s): "HGBA1C" in the last 72 hours. CBG: No results for input(s): "GLUCAP" in the last 168 hours. Lipid Profile: No results for input(s): "CHOL", "HDL", "LDLCALC", "TRIG", "CHOLHDL", "LDLDIRECT" in the last 72 hours. Thyroid Function Tests: No results for input(s): "TSH", "T4TOTAL", "FREET4", "T3FREE", "THYROIDAB" in the last 72 hours. Anemia Panel: No results for input(s): "VITAMINB12", "FOLATE", "FERRITIN", "TIBC", "IRON", "RETICCTPCT" in the last 72 hours. Urine analysis:    Component Value Date/Time   COLORURINE YELLOW 01/17/2024 0150   APPEARANCEUR CLEAR 01/17/2024 0150   LABSPEC 1.028 01/17/2024 0150   PHURINE 5.0 01/17/2024 0150   GLUCOSEU NEGATIVE 01/17/2024 0150   HGBUR NEGATIVE 01/17/2024 0150   BILIRUBINUR NEGATIVE 01/17/2024 0150   KETONESUR 5 (A) 01/17/2024 0150   PROTEINUR NEGATIVE  01/17/2024 0150   UROBILINOGEN 1.0 06/12/2008 2109   NITRITE NEGATIVE 01/17/2024 0150   LEUKOCYTESUR NEGATIVE 01/17/2024 0150    Radiological Exams on Admission: US  PELVIC COMPLETE W TRANSVAGINAL AND TORSION R/O Result Date: 01/16/2024 CLINICAL DATA:  Pelvic pain EXAM: TRANSABDOMINAL AND TRANSVAGINAL ULTRASOUND OF PELVIS DOPPLER ULTRASOUND OF OVARIES TECHNIQUE: Both transabdominal and transvaginal ultrasound examinations of the pelvis were performed. Transabdominal technique was performed for global imaging of the pelvis including uterus, ovaries, adnexal regions, and pelvic cul-de-sac. It was necessary to proceed with endovaginal exam following the transabdominal exam to visualize the ovaries. Color and duplex Doppler ultrasound was utilized to evaluate blood flow to the ovaries. COMPARISON:  CT from earlier in the same day. FINDINGS: Uterus Measurements: 8.9 x 3.5 x 4.3 cm. = volume: 69 mL. No fibroids or other mass visualized. Endometrium Thickness: 4 mm.  No focal abnormality visualized. Right ovary Measurements: 0.3 x 2.9 x 4.2 cm. = volume: 21 mL. Normal appearance/no adnexal mass. Left ovary Measurements: 2.5 x 1.5 x 2.1 cm. = volume: 4.2 mL. Normal appearance/no adnexal mass. Pulsed Doppler evaluation of both ovaries demonstrates normal low-resistance arterial and venous waveforms. Other findings Trace free fluid is noted within the pelvis likely physiologic in nature. IMPRESSION: No acute abnormality noted. Electronically Signed   By: Violeta Grey M.D.   On: 01/16/2024 23:59   DG Chest Portable 1 View Result Date: 01/16/2024 CLINICAL DATA:  Sepsis EXAM: PORTABLE CHEST 1 VIEW COMPARISON:  CT 06/12/2008 FINDINGS: The heart size and mediastinal contours are within normal limits. Both lungs are clear. The visualized skeletal structures are unremarkable. IMPRESSION: No active disease. Electronically Signed   By: Janeece Mechanic M.D.   On: 01/16/2024 20:58   CT ABDOMEN PELVIS W CONTRAST Result Date:  01/16/2024 CLINICAL DATA:  Abdominal pain, acute, nonlocalized EXAM: CT ABDOMEN AND PELVIS WITH CONTRAST TECHNIQUE: Multidetector CT imaging of the abdomen and pelvis was performed using the standard protocol following bolus administration of intravenous contrast. RADIATION DOSE REDUCTION: This exam was performed according to the departmental dose-optimization program which includes automated exposure control, adjustment  of the mA and/or kV according to patient size and/or use of iterative reconstruction technique. CONTRAST:  75mL OMNIPAQUE IOHEXOL 350 MG/ML SOLN COMPARISON:  None Available. FINDINGS: Lower chest: No acute abnormality Hepatobiliary: 2.8 cm cyst in the left hepatic lobe appears simple. No suspicious focal hepatic abnormality. Gallbladder unremarkable. Pancreas: No focal abnormality or ductal dilatation. Spleen: No focal abnormality.  Normal size. Adrenals/Urinary Tract: No adrenal abnormality. No focal renal abnormality. No stones or hydronephrosis. Urinary bladder is unremarkable. Stomach/Bowel: Multiple appendicoliths within the appendix. No appendiceal dilatation or surrounding inflammation. Stomach, large and small bowel grossly unremarkable. Vascular/Lymphatic: No evidence of aneurysm or adenopathy. Reproductive: Uterus and adnexa unremarkable.  No mass. Other: No free fluid or free air. Rim calcified structure in the right lower quadrant measures 2.5 x 1.3 cm, likely calcified lymph node. Musculoskeletal: No acute bony abnormality. IMPRESSION: No acute findings in the abdomen or pelvis. Electronically Signed   By: Janeece Mechanic M.D.   On: 01/16/2024 20:02    Assessment and Plan  Right lower quadrant abdominal pain, nausea, vomiting CT abdomen pelvis showing multiple appendicoliths within the appendix but no appendiceal dilatation or surrounding inflammation.  Patient states she had a colonoscopy 5 days ago and was told that she had a mass in her cecum and biopsy confirmed adenocarcinoma.   However, no colon mass reported on CT today.  Pelvic ultrasound showing no acute abnormality.  Patient is not actively vomiting at this time but continues to have significant pain and tenderness on exam, as such, I have consulted general surgery.  Keep n.p.o. at this time.  Continue pain management, antiemetic as needed.  SIRS versus possible sepsis Presenting with fever, tachycardia, hypotension, and mild lactic acidosis.  WBC count 11.0.  No obvious infectious source on CT abdomen pelvis, pelvic ultrasound, and chest x-ray.  UA not suggestive of infection.  COVID/influenza/RSV PCR negative.  Patient received 3 L IV fluids in the ED after which tachycardia and lactic acidosis resolved.  No longer febrile.  However, blood pressure remains soft with systolic in the 90s and suspect this is due to severe dehydration from not being able to tolerate p.o. intake for several days.  Continue IV fluid hydration.  Follow-up blood cultures and trend WBC count.  Cover with broad-spectrum antibiotics at this time given immunocompromised status and check procalcitonin level.  Rheumatoid arthritis She is on weekly methotrexate, last dose 01/13/2024.  Asthma Stable, no signs of acute exacerbation.  She does not use inhalers at home.  Normocytic anemia Hemoglobin 10.7 and MCV 86.5.  No overt bleeding.  Patient reportedly diagnosed with colon adenocarcinoma after colonoscopy 5 days ago.  Continue to monitor H&H.  DVT prophylaxis: SCDs until patient is evaluated by general surgery. Code Status: Full Code (discussed with the patient) Family Communication: Husband at bedside. Consults called: General Surgery (Dr. Hildy Lowers) Level of care: Progressive Care Unit Admission status: It is my clinical opinion that referral for OBSERVATION is reasonable and necessary in this patient based on the above information provided. The aforementioned taken together are felt to place the patient at high risk for further clinical  deterioration. However, it is anticipated that the patient may be medically stable for discharge from the hospital within 24 to 48 hours.  Juliette Oh MD [facility]  If 7PM-7AM, please contact night-coverage [email or web address]  01/17/2024, 3:46 AM

## 2024-01-17 NOTE — Progress Notes (Signed)
 Pharmacy Antibiotic Note  Sherry Abbott is a 48 y.o. female admitted on 01/16/2024 with sepsis.  Pharmacy has been consulted for cefepime and vancomycin dosing. The patient has a history of rheumatoid arthritis on methotrexate and newly diagnosed adenocarcinoma.   Plan: Vancomycin 1500mg  IV x1 then 750mg  BID (eAUC 471, sCr 0.8, Vd 0.72) Cefepime 2G IV q8 hours Monitor clinical progression and cultures  Height: 5\' 5"  (165.1 cm) Weight: 65.3 kg (144 lb) IBW/kg (Calculated) : 57  Temp (24hrs), Avg:99.4 F (37.4 C), Min:98.3 F (36.8 C), Max:102.7 F (39.3 C)  Recent Labs  Lab 01/16/24 1809 01/16/24 1815 01/16/24 1819 01/16/24 2006 01/16/24 2157  WBC  --   --   --  11.0*  --   CREATININE 0.84 0.80  --   --   --   LATICACIDVEN  --   --  2.8*  --  1.2    Estimated Creatinine Clearance: 76.5 mL/min (by C-G formula based on SCr of 0.8 mg/dL).    Allergies  Allergen Reactions   Neosporin [Neomycin-Bacitracin Zn-Polymyx] Other (See Comments)    Blistering rash   Shellfish Allergy Anaphylaxis   Sulfa Antibiotics Rash   Blueberry [Vaccinium Angustifolium] Itching    Bloating, "avoided all together"   Lactose Diarrhea and Itching    Bloating, "avoids all dairy"   Betadine [Povidone Iodine] Rash    Antimicrobials this admission: Vancomycin 6/10>> Cefepime 6/10>>   Microbiology results: 6/9 Bcx: pending  Thank you for allowing pharmacy to be a part of this patient's care.  Angelo Kennedy Laticia Vannostrand 01/17/2024 5:14 AM

## 2024-01-17 NOTE — Progress Notes (Addendum)
 Sherry Abbott  WUX:[id number] DOB: Jul 12, 1976 DOA: 01/16/2024 PCP: Sun, Vyvyan, MD    Brief Narrative:  48 year old with a history of asthma, hidradenitis suppurativa, migraine headaches, and RA on methotrexate who presented to the ER 01/16/2024 with 3 days of severe right lower quadrant abdominal pain associate with nausea and vomiting.  She had undergone colonoscopy with Dr. Cristina Donath [facility]) 5 days prior to this presentation at which time she was informed she had a mass in her cecum with biopsy confirming adenocarcinoma.  She was scheduled to follow-up with Dr. Ramiro Burly with General Surgery the day of her presentation but her symptoms prompted her to present to the ER instead.  In the ER UA was not consistent with infection.  CT abdomen/pelvis revealed no acute findings.  CXR was unrevealing.  Pelvic ultrasound was without acute findings.  Goals of Care:   Code Status: Full Code   DVT prophylaxis: SCDs Start: 01/17/24 0457   Interim Hx: The patient was interviewed and examined by one of my partners earlier today.  Since admission she has defervesced.  Her blood pressure, initially as low as 73 systolic, has normalized with simple volume expansion.  She is no longer tachycardic.  Assessment & Plan:  Right lower quadrant abdominal pain with nausea and vomiting Multiple appendicoliths noted on CT abdomen pelvis with no appendiceal dilatation or inflammation  Newly diagnosed adenocarcinoma of the cecum Diagnosed via colonoscopy as noted above   SIRS v/s sepsis Patient met SIRS/sepsis criteria presentation with fever tachycardia and leukocytosis -at present is not clear whether she has an acute infection or if this is simply SIRS - continue broad empiric antibiotics for now and monitor clinically  Mild hypokalemia Likely simply due to GI loss and poor intake -supplement - check magnesium  RA On weekly methotrexate with last dose 01/13/2024  Asthma Quiescent at present  Normocytic  anemia Hemoglobin 10.7 at presentation -likely related to newly diagnosed colon adenocarcinoma -possibility of a post-biopsy GI bleed must also be considered -monitor hemoglobin trend - Hgb continues to trend downward, but patient is also being volume expanded to a significant, such that this may just be dilutional - no apparent gross blood loss at this time   Sickle Cell Trait  No recent baseline Hgb, but trait does not tend to lead to anemia itself  Family Communication: spoke w/ husband at bedside  Disposition:    Objective: Blood pressure 118/72, pulse 69, temperature 98.4 F (36.9 C), temperature source Oral, resp. rate 12, height 5\' 5"  (1.651 m), weight 65.3 kg, SpO2 100%.  Intake/Output Summary (Last 24 hours) at 01/17/2024 0747 Last data filed at 01/17/2024 2536 Gross per 24 hour  Intake 100 ml  Output --  Net 100 ml   Filed Weights   01/16/24 2006  Weight: 65.3 kg    Examination: The patient was examined by one of my partners earlier today. She is in no acute distress at the time of my f/u exam.   CBC: Recent Labs  Lab 01/16/24 1815 01/16/24 2006 01/17/24 0516  WBC  --  11.0* 9.5  NEUTROABS  --  10.1*  --   HGB 10.5* 10.7* 9.0*  HCT 31.0* 31.4* 26.4*  MCV  --  86.5 86.8  PLT  --  225 175   Basic Metabolic Panel: Recent Labs  Lab 01/16/24 1809 01/16/24 1815 01/17/24 0516  NA 138 138 138  K 3.5 3.4* 3.4*  CL 104 106 109  CO2 19*  --  22  GLUCOSE 123* 122* 98  BUN 7 6 5*  CREATININE 0.84 0.80 0.84  CALCIUM 9.3  --  7.4*   GFR: Estimated Creatinine Clearance: 72.9 mL/min (by C-G formula based on SCr of 0.84 mg/dL).   Scheduled Meds:  Fremanezumab -vfrm  225 mg Subcutaneous Once   Continuous Infusions:  sodium chloride 125 mL/hr at 01/17/24 0559   ceFEPime (MAXIPIME) IV Stopped (01/17/24 6578)   metronidazole     vancomycin HCl     vancomycin       LOS: 0 days   Abbe Abate, MD [facility] Office  [phone] Pager - Text  Page per Tilford Foley  If 7PM-7AM, please contact night-coverage per Amion 01/17/2024, 7:47 AM

## 2024-01-17 NOTE — Progress Notes (Signed)
   01/17/24 1617  TOC Brief Assessment  Insurance and Status Reviewed (BCBS COMM PPO)  Patient has primary care physician  Paulene Boron, Vyvyan, MD)  Home environment has been reviewed From home with Spouse  Prior level of function: independent  Prior/Current Home Services No current home services  Social Drivers of Health Review SDOH reviewed no interventions necessary  Readmission risk has been reviewed  (14%)   Patient from home- Is taking methotrexate for RA  Recently underwent colonoscopy where cecal mass was found  - Bx showed adenocarcinoma

## 2024-01-17 NOTE — ED Provider Notes (Signed)
  Physical Exam  BP (!) 90/51   Pulse 75   Temp 98.3 F (36.8 C) (Oral)   Resp 20   Ht 5\' 5"  (1.651 m)   Wt 65.3 kg   SpO2 100%   BMI 23.96 kg/m   Physical Exam Vitals and nursing note reviewed.  Constitutional:      General: She is not in acute distress.    Appearance: She is not toxic-appearing.  HENT:     Head: Normocephalic and atraumatic.  Pulmonary:     Effort: No respiratory distress.  Skin:    Coloration: Skin is not jaundiced or pale.  Neurological:     Mental Status: She is alert and oriented to person, place, and time.  Psychiatric:        Behavior: Behavior normal.     Procedures  Procedures  ED Course / MDM   Clinical Course as of 01/17/24 0159  Mon Jan 16, 2024  2341 Needs to come in for sepsis work up. First colonoscopy on Thursday, Dr Feliberto Hopping, found mass, adenocarcinoma. Saturday began having generalized abdominal pain. Thought to be 2/2 to colonoscopy. CT scan done at [facility] on 6th showing appendiclith. Came in for persistent abdominal pain, nausea, vomiting. No diarrhea. No inflammatory changes here. Febrile tachycardic, soft blood pressure. Source most likely abdominal.  [CG]  2345 Intractable abdominal pain, nausea, vomiting. Meets sepsis criteria. [CG]  Tue Jan 17, 2024  0100 110/65 (80) [CG]  0155 BP 91/58 [CG]    Clinical Course User Index [CG] Adel Aden, PA-C   Medical Decision Making Amount and/or Complexity of Data Reviewed Labs: ordered. Radiology: ordered.  Risk OTC drugs. Prescription drug management. Decision regarding hospitalization.   48 year old female presents for evaluation.  Please see previous provider note for further detail.  In short 48 year old female presents for intractable nausea vomiting abdominal pain.  Patient reports a colonoscopy this past week with Dr. Feliberto Hopping of [facility] where the patient was found to have a mass suspected to be adenocarcinoma.  Patient began having abdominal pain, nausea and  vomiting on Saturday, went to [place] where she was noted to have appendicolith on CT scan.  Patient discharged.  Patient returned to ED today due to worsening nausea, vomiting and abdominal pain.  Patient lactic initially elevated however has come down after 3 L of fluid.  White count elevated to 11.  Previous provider feels source is intra-abdominal.  Issues collecting urine sample.  Patient blood pressure has slightly improved to 91/58 at this time.  Discussed for admission with Dr. Michell Ahumada who has agreed to admit the patient.  Patient amenable to plan.  Stable on admission.       Adel Aden, PA-C 01/17/24 0159    Earma Gloss, MD 01/17/24 [phone]

## 2024-01-17 NOTE — ED Notes (Signed)
 Patient moved to room 41, placed on monitoring equipment, IVF and ABX administered, patient given PRN pain medication. Patient asked for beverage, was informed that she is NPO, states no other needs at this time.

## 2024-01-18 ENCOUNTER — Encounter: Payer: Self-pay | Admitting: Gastroenterology

## 2024-01-18 ENCOUNTER — Encounter (HOSPITAL_COMMUNITY): Payer: Self-pay | Admitting: Internal Medicine

## 2024-01-18 DIAGNOSIS — C182 Malignant neoplasm of ascending colon: Secondary | ICD-10-CM

## 2024-01-18 DIAGNOSIS — D649 Anemia, unspecified: Secondary | ICD-10-CM | POA: Diagnosis not present

## 2024-01-18 DIAGNOSIS — M069 Rheumatoid arthritis, unspecified: Secondary | ICD-10-CM | POA: Diagnosis not present

## 2024-01-18 DIAGNOSIS — R1031 Right lower quadrant pain: Secondary | ICD-10-CM | POA: Diagnosis not present

## 2024-01-18 LAB — COMPREHENSIVE METABOLIC PANEL WITH GFR
ALT: 10 U/L (ref 0–44)
AST: 18 U/L (ref 15–41)
Albumin: 3.2 g/dL — ABNORMAL LOW (ref 3.5–5.0)
Alkaline Phosphatase: 30 U/L — ABNORMAL LOW (ref 38–126)
Anion gap: 8 (ref 5–15)
BUN: <5 mg/dL — ABNORMAL LOW (ref 6–20)
CO2: 22 mmol/L (ref 22–32)
Calcium: 8.2 mg/dL — ABNORMAL LOW (ref 8.9–10.3)
Chloride: 108 mmol/L (ref 98–111)
Creatinine, Ser: 0.75 mg/dL (ref 0.44–1.00)
GFR, Estimated: >60 mL/min (ref >60)
Glucose, Bld: 98 mg/dL (ref 70–99)
Potassium: 4.1 mmol/L (ref 3.5–5.1)
Sodium: 138 mmol/L (ref 135–145)
Total Bilirubin: 0.5 mg/dL (ref 0.0–1.2)
Total Protein: 6.6 g/dL (ref 6.5–8.1)

## 2024-01-18 LAB — CBC
HCT: 28.9 % — ABNORMAL LOW (ref 36.0–46.0)
Hemoglobin: 9.7 g/dL — ABNORMAL LOW (ref 12.0–15.0)
MCH: 29.3 pg (ref 26.0–34.0)
MCHC: 33.6 g/dL (ref 30.0–36.0)
MCV: 87.3 fL (ref 80.0–100.0)
Platelets: 190 10*3/uL (ref 150–400)
RBC: 3.31 MIL/uL — ABNORMAL LOW (ref 3.87–5.11)
RDW: 15.5 % (ref 11.5–15.5)
WBC: 7.6 10*3/uL (ref 4.0–10.5)
nRBC: 0 % (ref 0.0–0.2)

## 2024-01-18 LAB — MAGNESIUM: Magnesium: 2.2 mg/dL (ref 1.7–2.4)

## 2024-01-18 LAB — PHOSPHORUS: Phosphorus: 2.2 mg/dL — ABNORMAL LOW (ref 2.5–4.6)

## 2024-01-18 LAB — CEA: CEA: 5.4 ng/mL — ABNORMAL HIGH (ref 0.0–4.7)

## 2024-01-18 LAB — HEMOGLOBIN A1C
Hgb A1c MFr Bld: 5.6 % (ref 4.8–5.6)
Mean Plasma Glucose: 114.02 mg/dL

## 2024-01-18 LAB — SURGICAL PCR SCREEN
MRSA, PCR: NEGATIVE
Staphylococcus aureus: NEGATIVE

## 2024-01-18 MED ORDER — POTASSIUM PHOSPHATES 15 MMOLE/5ML IV SOLN
30.0000 mmol | Freq: Once | INTRAVENOUS | Status: AC
  Administered 2024-01-18: 30 mmol via INTRAVENOUS
  Filled 2024-01-18: qty 10

## 2024-01-18 NOTE — TOC Progression Note (Signed)
 Transition of Care [facility]) - Progression Note    Patient Details  Name: Sherry Abbott MRN: [id number] Date of Birth: 1975/12/08  [facility] CM/SW Contact  Eusebio High, RN Phone Number: 01/18/2024, 3:23 PM  Clinical Narrative:     Patient awaiting surgery- either this afternoon or early am tomorrow. TOC will continue to follow patient for any discharge needs             Expected Discharge Plan and Services                                               Social Determinants of Health (SDOH) Interventions SDOH Screenings   Food Insecurity: No Food Insecurity (01/17/2024)  Housing: Low Risk  (01/17/2024)  Transportation Needs: No Transportation Needs (01/17/2024)  Utilities: Not At Risk (01/17/2024)  Social Connections: Unknown (01/17/2024)  Tobacco Use: Low Risk  (01/17/2024)    Readmission Risk Interventions     No data to display

## 2024-01-18 NOTE — Progress Notes (Signed)
 PROGRESS NOTE        PATIENT DETAILS Name: Sherry Abbott Age: 48 y.o. 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    Significant events: 6/9>> admit to [facility]  Significant studies: 6/9>> CT abdomen/pelvis: No acute findings 6/9>> transabdominal/transvaginal ultrasound: No acute findings  Significant microbiology data: 6/9>> blood culture: No growth 6/9>> COVID/influenza/RSV PCR: Negative  Procedures: None  Consults: General surgery  Subjective: Continues to have some lower abdominal pain-watery stools overnight following bowel prep..  Objective: Vitals: Blood pressure 111/74, pulse 65, temperature 98.4 F (36.9 C), temperature source Oral, resp. rate 12, height 5' 5 (1.651 m), weight 65.3 kg, SpO2 100%.   Exam: Gen Exam:Alert awake-not in any distress HEENT:atraumatic, normocephalic Chest: B/L clear to auscultation anteriorly CVS:S1S2 regular Abdomen:soft non tender, non distended Extremities:no edema Neurology: Non focal Skin: no rash  Pertinent Labs/Radiology:    Latest Ref Rng & Units 01/17/2024    8:20 PM 01/17/2024   12:54 PM 01/17/2024    5:16 AM  CBC  WBC 4.0 - 10.5 K/uL 6.6  7.0  9.5   Hemoglobin 12.0 - 15.0 g/dL 9.2  8.4  9.0   Hematocrit 36.0 - 46.0 % 27.0  25.6  26.4   Platelets 150 - 400 K/uL 169  147  175     Lab Results  Component Value Date   NA 138 01/17/2024   K 3.4 (L) 01/17/2024   CL 109 01/17/2024   CO2 22 01/17/2024      Assessment/Plan: SIRS Probably related to adenocarcinoma of cecum/appendicoliths Sepsis All cultures negative Suspect antibiotics can be discontinued postoperatively.  RLQ abdominal pain Due to cecal mass-newly diagnosed adenocarcinoma  Not  felt to have appendicitis CT/ultrasound negative for acute abnormalities Some tenderness on exam but no peritoneal signs General surgery planning right hemicolectomy later today.  RA Weekly methotrexate-last dose 6/6 Hold until she is recovered from surgery  Asthma Stable not in exacerbation As needed bronchodilators  Normocytic anemia Hb stable Follow/trend periodically  History of sickle cell trait Supportive care.  Code status:   Code Status: Full Code   DVT Prophylaxis: Place and maintain sequential compression device Start: 01/17/24 1613 SCD's Start: 01/17/24 1205 SCDs Start: 01/17/24 0457   Family Communication: Spouse at bedside   Disposition Plan: Status is: Inpatient Remains inpatient appropriate because: Severity of illness   Planned Discharge Destination:Home   Diet: Diet Order             Diet NPO time specified Except for: Sips with Meds  Diet effective now                     Antimicrobial agents: Anti-infectives (From admission, onward)    Start     Dose/Rate Route Frequency Ordered Stop   01/18/24 0600  cefoTEtan (CEFOTAN) 2 g in sodium chloride 0.9 % 100 mL IVPB  Status:  Discontinued        2 g 200 mL/hr over 30 Minutes Intravenous On call to O.R. 01/17/24 1204 01/17/24 1351   01/17/24 1800  vancomycin (VANCOCIN) IVPB 750 mg/150 ml premix  Status:  Discontinued        750 mg 150 mL/hr over  60 Minutes Intravenous Every 12 hours 01/17/24 0511 01/17/24 0730   01/17/24 1800  metroNIDAZOLE (FLAGYL) IVPB 500 mg        500 mg 100 mL/hr over 60 Minutes Intravenous Every 12 hours 01/17/24 1352 01/18/24 1759   01/17/24 1000  vancomycin (VANCOREADY) IVPB 1500 mg/300 mL  Status:  Discontinued        1,500 mg 150 mL/hr over 120 Minutes Intravenous Every 12 hours 01/17/24 0514 01/17/24 0759   01/17/24 1000  vancomycin (VANCOREADY) IVPB 750 mg/150 mL  Status:  Discontinued        750 mg 150 mL/hr over 60 Minutes Intravenous Every 12 hours  01/17/24 0730 01/17/24 0759   01/17/24 0800  metroNIDAZOLE (FLAGYL) IVPB 500 mg        500 mg 100 mL/hr over 60 Minutes Intravenous Every 12 hours 01/17/24 0459     01/17/24 0600  ceFEPIme (MAXIPIME) 2 g in sodium chloride 0.9 % 100 mL IVPB        2 g 200 mL/hr over 30 Minutes Intravenous Every 8 hours 01/17/24 0508     01/17/24 0515  Vancomycin (VANCOCIN) 1,500 mg in sodium chloride 0.9 % 500 mL IVPB  Status:  Discontinued        1,500 mg 250 mL/hr over 120 Minutes Intravenous  Once 01/17/24 0508 01/17/24 0514   01/16/24 2030  cefTRIAXone (ROCEPHIN) 2 g in sodium chloride 0.9 % 100 mL IVPB        2 g 200 mL/hr over 30 Minutes Intravenous Once 01/16/24 2019 01/16/24 2144   01/16/24 2030  metroNIDAZOLE (FLAGYL) IVPB 500 mg        500 mg 100 mL/hr over 60 Minutes Intravenous  Once 01/16/24 2019 01/16/24 2329        MEDICATIONS: Scheduled Meds:  alvimopan  12 mg Oral On Call to OR   pantoprazole (PROTONIX) IV  40 mg Intravenous Q24H   Continuous Infusions:  ceFEPime (MAXIPIME) IV 2 g (01/17/24 2132)   dextrose 5 % and 0.9 % NaCl with KCl 20 mEq/L 75 mL/hr at 01/17/24 1922   metronidazole Stopped (01/17/24 0920)   metronidazole 500 mg (01/17/24 1920)   PRN Meds:.acetaminophen **OR** acetaminophen, fentaNYL (SUBLIMAZE) injection, naLOXone (NARCAN)  injection, ondansetron  (ZOFRAN ) IV   I have personally reviewed following labs and imaging studies  LABORATORY DATA: CBC: Recent Labs  Lab 01/16/24 1815 01/16/24 2006 01/17/24 0516 01/17/24 1254 01/17/24 2020  WBC  --  11.0* 9.5 7.0 6.6  NEUTROABS  --  10.1*  --   --   --   HGB 10.5* 10.7* 9.0* 8.4* 9.2*  HCT 31.0* 31.4* 26.4* 25.6* 27.0*  MCV  --  86.5 86.8 88.9 87.7  PLT  --  225 175 147* 169    Basic Metabolic Panel: Recent Labs  Lab 01/16/24 1809 01/16/24 1815 01/17/24 0516  NA 138 138 138  K 3.5 3.4* 3.4*  CL 104 106 109  CO2 19*  --  22  GLUCOSE 123* 122* 98  BUN 7 6 5*  CREATININE 0.84 0.80 0.84  CALCIUM  9.3  --  7.4*  MG  --   --  1.7    GFR: Estimated Creatinine Clearance: 72.9 mL/min (by C-G formula based on SCr of 0.84 mg/dL).  Liver Function Tests: Recent Labs  Lab 01/16/24 1809  AST 23  ALT 11  ALKPHOS 29*  BILITOT 1.0  PROT 7.4  ALBUMIN 4.1   Recent Labs  Lab 01/16/24 1809  LIPASE 23  No results for input(s): AMMONIA in the last 168 hours.  Coagulation Profile: Recent Labs  Lab 01/16/24 2149  INR 1.1    Cardiac Enzymes: No results for input(s): CKTOTAL, CKMB, CKMBINDEX, TROPONINI in the last 168 hours.  BNP (last 3 results) No results for input(s): PROBNP in the last 8760 hours.  Lipid Profile: No results for input(s): CHOL, HDL, LDLCALC, TRIG, CHOLHDL, LDLDIRECT in the last 72 hours.  Thyroid Function Tests: No results for input(s): TSH, T4TOTAL, FREET4, T3FREE, THYROIDAB in the last 72 hours.  Anemia Panel: No results for input(s): VITAMINB12, FOLATE, FERRITIN, TIBC, IRON, RETICCTPCT in the last 72 hours.  Urine analysis:    Component Value Date/Time   COLORURINE YELLOW 01/17/2024 0150   APPEARANCEUR CLEAR 01/17/2024 0150   LABSPEC 1.028 01/17/2024 0150   PHURINE 5.0 01/17/2024 0150   GLUCOSEU NEGATIVE 01/17/2024 0150   HGBUR NEGATIVE 01/17/2024 0150   BILIRUBINUR NEGATIVE 01/17/2024 0150   KETONESUR 5 (A) 01/17/2024 0150   PROTEINUR NEGATIVE 01/17/2024 0150   UROBILINOGEN 1.0 06/12/2008 2109   NITRITE NEGATIVE 01/17/2024 0150   LEUKOCYTESUR NEGATIVE 01/17/2024 0150    Sepsis Labs: Lactic Acid, Venous    Component Value Date/Time   LATICACIDVEN 1.2 01/16/2024 2157    MICROBIOLOGY: Recent Results (from the past 240 hours)  Culture, blood (routine x 2)     Status: None (Preliminary result)   Collection Time: 01/16/24  8:03 PM   Specimen: BLOOD  Result Value Ref Range Status   Specimen Description BLOOD SITE NOT SPECIFIED  Final   Special Requests   Final    BOTTLES DRAWN AEROBIC AND  ANAEROBIC Blood Culture adequate volume   Culture   Final    NO GROWTH < 12 HOURS Performed at [facility], [address] [place], [place] [address]    Report Status PENDING  Incomplete  Culture, blood (routine x 2)     Status: None (Preliminary result)   Collection Time: 01/16/24  8:06 PM   Specimen: BLOOD RIGHT ARM  Result Value Ref Range Status   Specimen Description BLOOD RIGHT ARM  Final   Special Requests   Final    BOTTLES DRAWN AEROBIC AND ANAEROBIC Blood Culture adequate volume   Culture   Final    NO GROWTH < 12 HOURS Performed at [facility], [address], [place], [place] [address]    Report Status PENDING  Incomplete  Resp panel by RT-PCR (RSV, Flu A&B, Covid) Anterior Nasal Swab     Status: None   Collection Time: 01/16/24  9:49 PM   Specimen: Anterior Nasal Swab  Result Value Ref Range Status   SARS Coronavirus 2 by RT PCR NEGATIVE NEGATIVE Final   Influenza A by PCR NEGATIVE NEGATIVE Final   Influenza B by PCR NEGATIVE NEGATIVE Final    Comment: (NOTE) The Xpert Xpress SARS-CoV-2/FLU/RSV plus assay is intended as an aid in the diagnosis of influenza from Nasopharyngeal swab specimens and should not be used as a sole basis for treatment. Nasal washings and aspirates are unacceptable for Xpert Xpress SARS-CoV-2/FLU/RSV testing.  Fact Sheet for Patients: BloggerCourse.com  Fact Sheet for Healthcare Providers: SeriousBroker.it  This test is not yet approved or cleared by the [place]  FDA and has been authorized for detection and/or diagnosis of SARS-CoV-2 by FDA under an Emergency Use Authorization (EUA). This EUA will remain in effect (meaning this test can be used) for the duration of the COVID-19 declaration under Section 564(b)(1) of the Act,  21 U.S.C. section 360bbb-3(b)(1), unless the authorization is terminated or revoked.     Resp Syncytial Virus by PCR NEGATIVE NEGATIVE Final     Comment: (NOTE) Fact Sheet for Patients: BloggerCourse.com  Fact Sheet for Healthcare Providers: SeriousBroker.it  This test is not yet approved or cleared by the [place]  FDA and has been authorized for detection and/or diagnosis of SARS-CoV-2 by FDA under an Emergency Use Authorization (EUA). This EUA will remain in effect (meaning this test can be used) for the duration of the COVID-19 declaration under Section 564(b)(1) of the Act, 21 U.S.C. section 360bbb-3(b)(1), unless the authorization is terminated or revoked.  Performed at [facility], [address], [place], [place] [address]     RADIOLOGY STUDIES/RESULTS: US  PELVIC COMPLETE W TRANSVAGINAL AND TORSION R/O Result Date: 01/16/2024 CLINICAL DATA:  Pelvic pain EXAM: TRANSABDOMINAL AND TRANSVAGINAL ULTRASOUND OF PELVIS DOPPLER ULTRASOUND OF OVARIES TECHNIQUE: Both transabdominal and transvaginal ultrasound examinations of the pelvis were performed. Transabdominal technique was performed for global imaging of the pelvis including uterus, ovaries, adnexal regions, and pelvic cul-de-sac. It was necessary to proceed with endovaginal exam following the transabdominal exam to visualize the ovaries. Color and duplex Doppler ultrasound was utilized to evaluate blood flow to the ovaries. COMPARISON:  CT from earlier in the same day. FINDINGS: Uterus Measurements: 8.9 x 3.5 x 4.3 cm. = volume: 69 mL. No fibroids or other mass visualized. Endometrium Thickness: 4 mm.  No focal abnormality visualized. Right ovary Measurements: 0.3 x 2.9 x 4.2 cm. = volume: 21 mL. Normal appearance/no adnexal mass. Left ovary Measurements: 2.5 x 1.5 x 2.1 cm. = volume: 4.2 mL. Normal appearance/no adnexal mass. Pulsed Doppler evaluation of both ovaries demonstrates normal low-resistance arterial and venous waveforms. Other findings Trace free fluid is noted within the pelvis likely physiologic in nature.  IMPRESSION: No acute abnormality noted. Electronically Signed   By: Violeta Grey M.D.   On: 01/16/2024 23:59   DG Chest Portable 1 View Result Date: 01/16/2024 CLINICAL DATA:  Sepsis EXAM: PORTABLE CHEST 1 VIEW COMPARISON:  CT 06/12/2008 FINDINGS: The heart size and mediastinal contours are within normal limits. Both lungs are clear. The visualized skeletal structures are unremarkable. IMPRESSION: No active disease. Electronically Signed   By: Janeece Mechanic M.D.   On: 01/16/2024 20:58   CT ABDOMEN PELVIS W CONTRAST Result Date: 01/16/2024 CLINICAL DATA:  Abdominal pain, acute, nonlocalized EXAM: CT ABDOMEN AND PELVIS WITH CONTRAST TECHNIQUE: Multidetector CT imaging of the abdomen and pelvis was performed using the standard protocol following bolus administration of intravenous contrast. RADIATION DOSE REDUCTION: This exam was performed according to the departmental dose-optimization program which includes automated exposure control, adjustment of the mA and/or kV according to patient size and/or use of iterative reconstruction technique. CONTRAST:  75mL OMNIPAQUE IOHEXOL 350 MG/ML SOLN COMPARISON:  None Available. FINDINGS: Lower chest: No acute abnormality Hepatobiliary: 2.8 cm cyst in the left hepatic lobe appears simple. No suspicious focal hepatic abnormality. Gallbladder unremarkable. Pancreas: No focal abnormality or ductal dilatation. Spleen: No focal abnormality.  Normal size. Adrenals/Urinary Tract: No adrenal abnormality. No focal renal abnormality. No stones or hydronephrosis. Urinary bladder is unremarkable. Stomach/Bowel: Multiple appendicoliths within the appendix. No appendiceal dilatation or surrounding inflammation. Stomach, large and small bowel grossly unremarkable. Vascular/Lymphatic: No evidence of aneurysm or adenopathy. Reproductive: Uterus and adnexa unremarkable.  No mass. Other: No free fluid or free air. Rim calcified structure in the right lower quadrant measures 2.5 x 1.3 cm, likely  calcified lymph  node. Musculoskeletal: No acute bony abnormality. IMPRESSION: No acute findings in the abdomen or pelvis. Electronically Signed   By: Janeece Mechanic M.D.   On: 01/16/2024 20:02     LOS: 1 day   Kimberly Penna, MD  [facility]    To contact the attending provider between 7A-7P or the covering provider during after hours 7P-7A, please log into the web site [email or web address] and access using universal [place] password for that web site. If you do not have the password, please call the hospital operator.  01/18/2024, 6:30 AM

## 2024-01-18 NOTE — Plan of Care (Signed)
 Patient has been taking her bowel regime and has multiple bowel movements since.

## 2024-01-18 NOTE — Progress Notes (Signed)
 * Day of Surgery *  Subjective: CC: Patient in bed with complaint of moderate to severe pain. Patient stated that her IV was dislodged this AM and so she was not able to receive pain control since she is currently NPO pending surgery between later today and early tomorrow. Advised patient of a potential bump in her surgery scheduled for today (6/11). IV team has been notified to reinsert PIV.    Objective: Vital signs in last 24 hours: Temp:  [97.8 F (36.6 C)-98.5 F (36.9 C)] 98.5 F (36.9 C) (06/11 0727) Pulse Rate:  [59-69] 69 (06/11 0727) Resp:  [11-23] 21 (06/11 0727) BP: (96-122)/(47-74) 96/51 (06/11 0727) SpO2:  [99 %-100 %] 100 % (06/11 0727)    Intake/Output from previous day: No intake/output data recorded. Intake/Output this shift: No intake/output data recorded.  PE: Physical Exam Constitutional:      General: She is not in acute distress. Pulmonary:     Effort: Pulmonary effort is normal.  Abdominal:     General: There is no distension.     Palpations: Abdomen is soft.  Skin:    General: Skin is warm and dry.  Neurological:     General: No focal deficit present.     Mental Status: She is alert.     Lab Results:  Recent Labs    01/17/24 2020 01/18/24 0623  WBC 6.6 7.6  HGB 9.2* 9.7*  HCT 27.0* 28.9*  PLT 169 190   BMET Recent Labs    01/17/24 0516 01/18/24 0623  NA 138 138  K 3.4* 4.1  CL 109 108  CO2 22 22  GLUCOSE 98 98  BUN 5* <5*  CREATININE 0.84 0.75  CALCIUM 7.4* 8.2*   PT/INR Recent Labs    01/16/24 2149  LABPROT 14.6  INR 1.1   CMP     Component Value Date/Time   NA 138 01/18/2024 0623   K 4.1 01/18/2024 0623   CL 108 01/18/2024 0623   CO2 22 01/18/2024 0623   GLUCOSE 98 01/18/2024 0623   BUN <5 (L) 01/18/2024 0623   CREATININE 0.75 01/18/2024 0623   CALCIUM 8.2 (L) 01/18/2024 0623   PROT 6.6 01/18/2024 0623   ALBUMIN 3.2 (L) 01/18/2024 0623   AST 18 01/18/2024 0623   ALT 10 01/18/2024 0623   ALKPHOS 30  (L) 01/18/2024 0623   BILITOT 0.5 01/18/2024 0623   GFRNONAA >60 01/18/2024 0623   GFRAA  06/12/2008 2052    >60        The eGFR has been calculated using the MDRD equation. This calculation has not been validated in all clinical   Lipase     Component Value Date/Time   LIPASE 23 01/16/2024 1809    Studies/Results: US  PELVIC COMPLETE W TRANSVAGINAL AND TORSION R/O Result Date: 01/16/2024 CLINICAL DATA:  Pelvic pain EXAM: TRANSABDOMINAL AND TRANSVAGINAL ULTRASOUND OF PELVIS DOPPLER ULTRASOUND OF OVARIES TECHNIQUE: Both transabdominal and transvaginal ultrasound examinations of the pelvis were performed. Transabdominal technique was performed for global imaging of the pelvis including uterus, ovaries, adnexal regions, and pelvic cul-de-sac. It was necessary to proceed with endovaginal exam following the transabdominal exam to visualize the ovaries. Color and duplex Doppler ultrasound was utilized to evaluate blood flow to the ovaries. COMPARISON:  CT from earlier in the same day. FINDINGS: Uterus Measurements: 8.9 x 3.5 x 4.3 cm. = volume: 69 mL. No fibroids or other mass visualized. Endometrium Thickness: 4 mm.  No focal abnormality visualized. Right ovary Measurements: 0.3  x 2.9 x 4.2 cm. = volume: 21 mL. Normal appearance/no adnexal mass. Left ovary Measurements: 2.5 x 1.5 x 2.1 cm. = volume: 4.2 mL. Normal appearance/no adnexal mass. Pulsed Doppler evaluation of both ovaries demonstrates normal low-resistance arterial and venous waveforms. Other findings Trace free fluid is noted within the pelvis likely physiologic in nature. IMPRESSION: No acute abnormality noted. Electronically Signed   By: Violeta Grey M.D.   On: 01/16/2024 23:59   DG Chest Portable 1 View Result Date: 01/16/2024 CLINICAL DATA:  Sepsis EXAM: PORTABLE CHEST 1 VIEW COMPARISON:  CT 06/12/2008 FINDINGS: The heart size and mediastinal contours are within normal limits. Both lungs are clear. The visualized skeletal structures are  unremarkable. IMPRESSION: No active disease. Electronically Signed   By: Janeece Mechanic M.D.   On: 01/16/2024 20:58   CT ABDOMEN PELVIS W CONTRAST Result Date: 01/16/2024 CLINICAL DATA:  Abdominal pain, acute, nonlocalized EXAM: CT ABDOMEN AND PELVIS WITH CONTRAST TECHNIQUE: Multidetector CT imaging of the abdomen and pelvis was performed using the standard protocol following bolus administration of intravenous contrast. RADIATION DOSE REDUCTION: This exam was performed according to the departmental dose-optimization program which includes automated exposure control, adjustment of the mA and/or kV according to patient size and/or use of iterative reconstruction technique. CONTRAST:  75mL OMNIPAQUE IOHEXOL 350 MG/ML SOLN COMPARISON:  None Available. FINDINGS: Lower chest: No acute abnormality Hepatobiliary: 2.8 cm cyst in the left hepatic lobe appears simple. No suspicious focal hepatic abnormality. Gallbladder unremarkable. Pancreas: No focal abnormality or ductal dilatation. Spleen: No focal abnormality.  Normal size. Adrenals/Urinary Tract: No adrenal abnormality. No focal renal abnormality. No stones or hydronephrosis. Urinary bladder is unremarkable. Stomach/Bowel: Multiple appendicoliths within the appendix. No appendiceal dilatation or surrounding inflammation. Stomach, large and small bowel grossly unremarkable. Vascular/Lymphatic: No evidence of aneurysm or adenopathy. Reproductive: Uterus and adnexa unremarkable.  No mass. Other: No free fluid or free air. Rim calcified structure in the right lower quadrant measures 2.5 x 1.3 cm, likely calcified lymph node. Musculoskeletal: No acute bony abnormality. IMPRESSION: No acute findings in the abdomen or pelvis. Electronically Signed   By: Janeece Mechanic M.D.   On: 01/16/2024 20:02    Anti-infectives: Anti-infectives (From admission, onward)    Start     Dose/Rate Route Frequency Ordered Stop   01/18/24 0600  cefoTEtan (CEFOTAN) 2 g in sodium chloride 0.9 %  100 mL IVPB  Status:  Discontinued        2 g 200 mL/hr over 30 Minutes Intravenous On call to O.R. 01/17/24 1204 01/17/24 1351   01/17/24 1800  vancomycin (VANCOCIN) IVPB 750 mg/150 ml premix  Status:  Discontinued        750 mg 150 mL/hr over 60 Minutes Intravenous Every 12 hours 01/17/24 0511 01/17/24 0730   01/17/24 1800  metroNIDAZOLE (FLAGYL) IVPB 500 mg        500 mg 100 mL/hr over 60 Minutes Intravenous Every 12 hours 01/17/24 1352 01/18/24 1759   01/17/24 1000  vancomycin (VANCOREADY) IVPB 1500 mg/300 mL  Status:  Discontinued        1,500 mg 150 mL/hr over 120 Minutes Intravenous Every 12 hours 01/17/24 0514 01/17/24 0759   01/17/24 1000  vancomycin (VANCOREADY) IVPB 750 mg/150 mL  Status:  Discontinued        750 mg 150 mL/hr over 60 Minutes Intravenous Every 12 hours 01/17/24 0730 01/17/24 0759   01/17/24 0800  metroNIDAZOLE (FLAGYL) IVPB 500 mg        500 mg  100 mL/hr over 60 Minutes Intravenous Every 12 hours 01/17/24 0459     01/17/24 0600  ceFEPIme (MAXIPIME) 2 g in sodium chloride 0.9 % 100 mL IVPB        2 g 200 mL/hr over 30 Minutes Intravenous Every 8 hours 01/17/24 0508     01/17/24 0515  Vancomycin (VANCOCIN) 1,500 mg in sodium chloride 0.9 % 500 mL IVPB  Status:  Discontinued        1,500 mg 250 mL/hr over 120 Minutes Intravenous  Once 01/17/24 0508 01/17/24 0514   01/16/24 2030  cefTRIAXone (ROCEPHIN) 2 g in sodium chloride 0.9 % 100 mL IVPB        2 g 200 mL/hr over 30 Minutes Intravenous Once 01/16/24 2019 01/16/24 2144   01/16/24 2030  metroNIDAZOLE (FLAGYL) IVPB 500 mg        500 mg 100 mL/hr over 60 Minutes Intravenous  Once 01/16/24 2019 01/16/24 2329        Assessment/Plan Cecal adenocarcinoma    Since patient is pending surgery between later today and early tomorrow, patient should remain n.p.o. until further concrete plan is established. If surgery is official for tomorrow morning patient may have clear liquid diet today then n.p.o. at midnight.  Continue pain management with IV fentanyl. Will discuss further with my attending.  FEN - NPO VTE - SCD's, ID -cefepime and Flagyl Foley - None Plan - Plan for surgery between this afternoon and early tomorrow.   I reviewed nursing notes, ED provider notes, last 24 h vitals and pain scores, last 48 h intake and output, last 24 h labs and trends, and last 24 h imaging results.  This care required moderate level of medical decision making.    LOS: 1 day    Mirta Ammon, [facility] Surgery 01/18/2024, 11:21 AM Please see Amion for pager number during day hours 7:00am-4:30pm

## 2024-01-19 ENCOUNTER — Inpatient Hospital Stay (HOSPITAL_COMMUNITY)

## 2024-01-19 ENCOUNTER — Encounter (HOSPITAL_COMMUNITY)
Admission: EM | Disposition: A | Discharge status: Home / Self Care | Payer: Self-pay | Source: Home / Self Care | Attending: Internal Medicine

## 2024-01-19 ENCOUNTER — Other Ambulatory Visit: Payer: Self-pay

## 2024-01-19 ENCOUNTER — Encounter (HOSPITAL_COMMUNITY): Payer: Self-pay | Admitting: Internal Medicine

## 2024-01-19 DIAGNOSIS — R112 Nausea with vomiting, unspecified: Secondary | ICD-10-CM | POA: Diagnosis not present

## 2024-01-19 DIAGNOSIS — R1031 Right lower quadrant pain: Secondary | ICD-10-CM | POA: Diagnosis not present

## 2024-01-19 DIAGNOSIS — C182 Malignant neoplasm of ascending colon: Secondary | ICD-10-CM | POA: Diagnosis not present

## 2024-01-19 DIAGNOSIS — M069 Rheumatoid arthritis, unspecified: Secondary | ICD-10-CM | POA: Diagnosis not present

## 2024-01-19 HISTORY — PX: LAPAROSCOPIC PARTIAL COLECTOMY: SHX5907

## 2024-01-19 LAB — CBC
HCT: 26.9 % — ABNORMAL LOW (ref 36.0–46.0)
Hemoglobin: 9.1 g/dL — ABNORMAL LOW (ref 12.0–15.0)
MCH: 29.2 pg (ref 26.0–34.0)
MCHC: 33.8 g/dL (ref 30.0–36.0)
MCV: 86.2 fL (ref 80.0–100.0)
Platelets: 184 10*3/uL (ref 150–400)
RBC: 3.12 MIL/uL — ABNORMAL LOW (ref 3.87–5.11)
RDW: 15.2 % (ref 11.5–15.5)
WBC: 4.8 10*3/uL (ref 4.0–10.5)
nRBC: 0 % (ref 0.0–0.2)

## 2024-01-19 LAB — COMPREHENSIVE METABOLIC PANEL WITH GFR
ALT: 9 U/L (ref 0–44)
AST: 14 U/L — ABNORMAL LOW (ref 15–41)
Albumin: 3 g/dL — ABNORMAL LOW (ref 3.5–5.0)
Alkaline Phosphatase: 24 U/L — ABNORMAL LOW (ref 38–126)
Anion gap: 7 (ref 5–15)
BUN: <5 mg/dL — ABNORMAL LOW (ref 6–20)
CO2: 25 mmol/L (ref 22–32)
Calcium: 8.6 mg/dL — ABNORMAL LOW (ref 8.9–10.3)
Chloride: 109 mmol/L (ref 98–111)
Creatinine, Ser: 0.94 mg/dL (ref 0.44–1.00)
GFR, Estimated: >60 mL/min (ref >60)
Glucose, Bld: 99 mg/dL (ref 70–99)
Potassium: 3.7 mmol/L (ref 3.5–5.1)
Sodium: 141 mmol/L (ref 135–145)
Total Bilirubin: 0.8 mg/dL (ref 0.0–1.2)
Total Protein: 6.1 g/dL — ABNORMAL LOW (ref 6.5–8.1)

## 2024-01-19 LAB — MAGNESIUM: Magnesium: 1.9 mg/dL (ref 1.7–2.4)

## 2024-01-19 LAB — PHOSPHORUS: Phosphorus: 3.8 mg/dL (ref 2.5–4.6)

## 2024-01-19 SURGERY — LAPAROSCOPIC PARTIAL COLECTOMY
Anesthesia: General | Site: Abdomen | Laterality: Right

## 2024-01-19 MED ORDER — MORPHINE SULFATE (PF) 2 MG/ML IV SOLN
2.0000 mg | INTRAVENOUS | Status: DC | PRN
  Administered 2024-01-19 (×2): 2 mg via INTRAVENOUS
  Filled 2024-01-19 (×2): qty 1

## 2024-01-19 MED ORDER — ONDANSETRON HCL 4 MG/2ML IJ SOLN
INTRAMUSCULAR | Status: AC
  Filled 2024-01-19: qty 2

## 2024-01-19 MED ORDER — DEXAMETHASONE SODIUM PHOSPHATE 10 MG/ML IJ SOLN
INTRAMUSCULAR | Status: DC | PRN
  Administered 2024-01-19: 10 mg via INTRAVENOUS

## 2024-01-19 MED ORDER — SUGAMMADEX SODIUM 200 MG/2ML IV SOLN
INTRAVENOUS | Status: DC | PRN
  Administered 2024-01-19: 200 mg via INTRAVENOUS

## 2024-01-19 MED ORDER — STERILE WATER FOR IRRIGATION IR SOLN
Status: DC | PRN
  Administered 2024-01-19: 1000 mL

## 2024-01-19 MED ORDER — ROCURONIUM BROMIDE 10 MG/ML (PF) SYRINGE
PREFILLED_SYRINGE | INTRAVENOUS | Status: AC
  Filled 2024-01-19: qty 10

## 2024-01-19 MED ORDER — DEXMEDETOMIDINE HCL IN NACL 80 MCG/20ML IV SOLN
INTRAVENOUS | Status: AC
Start: 2024-01-19 — End: 2024-01-19
  Filled 2024-01-19: qty 20

## 2024-01-19 MED ORDER — OXYCODONE HCL 5 MG PO TABS
5.0000 mg | ORAL_TABLET | ORAL | Status: DC | PRN
  Administered 2024-01-19 – 2024-01-20 (×3): 10 mg via ORAL
  Administered 2024-01-21: 5 mg via ORAL
  Administered 2024-01-22: 10 mg via ORAL
  Filled 2024-01-19 (×5): qty 2
  Filled 2024-01-19: qty 1

## 2024-01-19 MED ORDER — OXYCODONE HCL 5 MG PO TABS: 5.0000 mg | ORAL_TABLET | Freq: Once | ORAL | Status: DC | PRN

## 2024-01-19 MED ORDER — DIPHENHYDRAMINE HCL 50 MG/ML IJ SOLN: 25.0000 mg | Freq: Four times a day (QID) | INTRAMUSCULAR | Status: DC | PRN

## 2024-01-19 MED ORDER — FENTANYL CITRATE (PF) 250 MCG/5ML IJ SOLN
INTRAMUSCULAR | Status: DC | PRN
  Administered 2024-01-19: 150 ug via INTRAVENOUS
  Administered 2024-01-19 (×2): 50 ug via INTRAVENOUS

## 2024-01-19 MED ORDER — PROPOFOL 10 MG/ML IV BOLUS
INTRAVENOUS | Status: DC | PRN
  Administered 2024-01-19: 200 mg via INTRAVENOUS

## 2024-01-19 MED ORDER — MEPERIDINE HCL 25 MG/ML IJ SOLN: 6.2500 mg | INTRAMUSCULAR | Status: DC | PRN

## 2024-01-19 MED ORDER — ACETAMINOPHEN 500 MG PO TABS
1000.0000 mg | ORAL_TABLET | Freq: Four times a day (QID) | ORAL | Status: AC
Start: 2024-01-19 — End: 2024-01-26
  Administered 2024-01-20 – 2024-01-23 (×12): 1000 mg via ORAL
  Filled 2024-01-19 (×13): qty 2

## 2024-01-19 MED ORDER — MIDAZOLAM HCL 2 MG/2ML IJ SOLN: 0.5000 mg | Freq: Once | INTRAMUSCULAR | Status: DC | PRN

## 2024-01-19 MED ORDER — PROCHLORPERAZINE EDISYLATE 10 MG/2ML IJ SOLN: 5.0000 mg | Freq: Four times a day (QID) | INTRAMUSCULAR | Status: DC | PRN

## 2024-01-19 MED ORDER — PROCHLORPERAZINE MALEATE 10 MG PO TABS
10.0000 mg | ORAL_TABLET | Freq: Four times a day (QID) | ORAL | Status: AC | PRN
Start: 2024-01-19 — End: ?

## 2024-01-19 MED ORDER — ONDANSETRON HCL 4 MG/2ML IJ SOLN
INTRAMUSCULAR | Status: DC | PRN
  Administered 2024-01-19: 4 mg via INTRAVENOUS

## 2024-01-19 MED ORDER — DIPHENHYDRAMINE HCL 25 MG PO CAPS: 25.0000 mg | ORAL_CAPSULE | Freq: Four times a day (QID) | ORAL | Status: DC | PRN

## 2024-01-19 MED ORDER — DEXAMETHASONE SODIUM PHOSPHATE 10 MG/ML IJ SOLN
INTRAMUSCULAR | Status: AC
  Filled 2024-01-19: qty 1

## 2024-01-19 MED ORDER — DEXAMETHASONE SODIUM PHOSPHATE 10 MG/ML IJ SOLN
INTRAMUSCULAR | Status: AC
Start: 2024-01-19 — End: 2024-01-19
  Filled 2024-01-19: qty 1

## 2024-01-19 MED ORDER — BOOST / RESOURCE BREEZE PO LIQD CUSTOM
1.0000 | Freq: Three times a day (TID) | ORAL | Status: DC
  Administered 2024-01-19: 1 via ORAL

## 2024-01-19 MED ORDER — ENOXAPARIN SODIUM 40 MG/0.4ML IJ SOSY
40.0000 mg | PREFILLED_SYRINGE | INTRAMUSCULAR | Status: DC
  Administered 2024-01-20 – 2024-01-22 (×3): 40 mg via SUBCUTANEOUS
  Filled 2024-01-19 (×4): qty 0.4

## 2024-01-19 MED ORDER — PHENYLEPHRINE HCL-NACL 20-0.9 MG/250ML-% IV SOLN
INTRAVENOUS | Status: DC | PRN
  Administered 2024-01-19: 35 ug/min via INTRAVENOUS

## 2024-01-19 MED ORDER — PROPOFOL 10 MG/ML IV BOLUS
INTRAVENOUS | Status: AC
Start: 2024-01-19 — End: 2024-01-19
  Filled 2024-01-19: qty 20

## 2024-01-19 MED ORDER — METHOCARBAMOL 1000 MG/10ML IJ SOLN: 500.0000 mg | Freq: Three times a day (TID) | INTRAMUSCULAR | Status: DC | PRN

## 2024-01-19 MED ORDER — ATROPINE SULFATE 0.4 MG/ML IV SOLN
INTRAVENOUS | Status: AC
Start: 2024-01-19 — End: 2024-01-19
  Filled 2024-01-19: qty 1

## 2024-01-19 MED ORDER — ORAL CARE MOUTH RINSE: 15.0000 mL | Freq: Once | OROMUCOSAL | Status: DC

## 2024-01-19 MED ORDER — HYDROMORPHONE HCL 1 MG/ML IJ SOLN
0.5000 mg | INTRAMUSCULAR | Status: DC | PRN
  Administered 2024-01-19 – 2024-01-20 (×2): 1 mg via INTRAVENOUS
  Filled 2024-01-19 (×2): qty 1

## 2024-01-19 MED ORDER — HYDROMORPHONE HCL 1 MG/ML IJ SOLN
0.2500 mg | INTRAMUSCULAR | Status: DC | PRN
  Administered 2024-01-19 (×2): 0.5 mg via INTRAVENOUS

## 2024-01-19 MED ORDER — CHLORHEXIDINE GLUCONATE 0.12 % MT SOLN: 15.0000 mL | Freq: Once | OROMUCOSAL | Status: DC

## 2024-01-19 MED ORDER — FENTANYL CITRATE (PF) 250 MCG/5ML IJ SOLN
INTRAMUSCULAR | Status: AC
  Filled 2024-01-19: qty 5

## 2024-01-19 MED ORDER — ROCURONIUM BROMIDE 10 MG/ML (PF) SYRINGE
PREFILLED_SYRINGE | INTRAVENOUS | Status: DC | PRN
  Administered 2024-01-19: 50 mg via INTRAVENOUS
  Administered 2024-01-19: 20 mg via INTRAVENOUS

## 2024-01-19 MED ORDER — HYDROMORPHONE HCL 1 MG/ML IJ SOLN
INTRAMUSCULAR | Status: DC | PRN
  Administered 2024-01-19: .5 mg via INTRAVENOUS

## 2024-01-19 MED ORDER — HYDROMORPHONE HCL 1 MG/ML IJ SOLN
INTRAMUSCULAR | Status: AC
  Filled 2024-01-19: qty 1

## 2024-01-19 MED ORDER — OXYCODONE HCL 5 MG/5ML PO SOLN: 5.0000 mg | Freq: Once | ORAL | Status: DC | PRN

## 2024-01-19 MED ORDER — PHENYLEPHRINE 80 MCG/ML (10ML) SYRINGE FOR IV PUSH (FOR BLOOD PRESSURE SUPPORT)
PREFILLED_SYRINGE | INTRAVENOUS | Status: DC | PRN
  Administered 2024-01-19: 160 ug via INTRAVENOUS

## 2024-01-19 MED ORDER — BUPIVACAINE-EPINEPHRINE (PF) 0.25% -1:200000 IJ SOLN
INTRAMUSCULAR | Status: AC
  Filled 2024-01-19: qty 30

## 2024-01-19 MED ORDER — BUPIVACAINE-EPINEPHRINE 0.25% -1:200000 IJ SOLN: INTRAMUSCULAR | Status: DC | PRN

## 2024-01-19 MED ORDER — MIDAZOLAM HCL 2 MG/2ML IJ SOLN
INTRAMUSCULAR | Status: DC | PRN
  Administered 2024-01-19: 2 mg via INTRAVENOUS

## 2024-01-19 MED ORDER — BUPIVACAINE LIPOSOME 1.3 % IJ SUSP
INTRAMUSCULAR | Status: AC
  Filled 2024-01-19: qty 20

## 2024-01-19 MED ORDER — EPHEDRINE SULFATE-NACL 50-0.9 MG/10ML-% IV SOSY
PREFILLED_SYRINGE | INTRAVENOUS | Status: DC | PRN
  Administered 2024-01-19: 10 mg via INTRAVENOUS

## 2024-01-19 MED ORDER — MIDAZOLAM HCL 2 MG/2ML IJ SOLN
INTRAMUSCULAR | Status: AC
  Filled 2024-01-19: qty 2

## 2024-01-19 MED ORDER — 0.9 % SODIUM CHLORIDE (POUR BTL) OPTIME
TOPICAL | Status: DC | PRN
  Administered 2024-01-19: 2000 mL

## 2024-01-19 MED ORDER — LIDOCAINE 2% (20 MG/ML) 5 ML SYRINGE
INTRAMUSCULAR | Status: AC
  Filled 2024-01-19: qty 5

## 2024-01-19 MED ORDER — SUCCINYLCHOLINE CHLORIDE 200 MG/10ML IV SOSY
PREFILLED_SYRINGE | INTRAVENOUS | Status: DC | PRN
  Administered 2024-01-19: 160 mg via INTRAVENOUS

## 2024-01-19 MED ORDER — CHLORHEXIDINE GLUCONATE 0.12 % MT SOLN
OROMUCOSAL | Status: AC
  Filled 2024-01-19: qty 15

## 2024-01-19 MED ORDER — LACTATED RINGERS IV SOLN: INTRAVENOUS | Status: DC

## 2024-01-19 MED ORDER — LIDOCAINE 2% (20 MG/ML) 5 ML SYRINGE
INTRAMUSCULAR | Status: DC | PRN
  Administered 2024-01-19: 20 mg via INTRAVENOUS

## 2024-01-19 MED ORDER — PHENYLEPHRINE 80 MCG/ML (10ML) SYRINGE FOR IV PUSH (FOR BLOOD PRESSURE SUPPORT)
PREFILLED_SYRINGE | INTRAVENOUS | Status: AC
Start: 2024-01-19 — End: 2024-01-19
  Filled 2024-01-19: qty 10

## 2024-01-19 MED ORDER — SODIUM CHLORIDE 0.9 % IV SOLN: INTRAVENOUS | Status: DC | PRN

## 2024-01-19 MED ORDER — ALBUMIN HUMAN 5 % IV SOLN
INTRAVENOUS | Status: DC | PRN
Start: 2024-01-19 — End: 2024-01-19

## 2024-01-19 SURGICAL SUPPLY — 48 items
BAG COUNTER SPONGE SURGICOUNT (BAG) ×1 IMPLANT
CANISTER SUCTION 3000ML PPV (SUCTIONS) ×1 IMPLANT
COVER SURGICAL LIGHT HANDLE (MISCELLANEOUS) ×1 IMPLANT
DERMABOND ADVANCED .7 DNX12 (GAUZE/BANDAGES/DRESSINGS) IMPLANT
DRAPE LAPAROSCOPIC ABDOMINAL (DRAPES) IMPLANT
DRAPE WARM FLUID 44X44 (DRAPES) IMPLANT
DRSG OPSITE POSTOP 4X10 (GAUZE/BANDAGES/DRESSINGS) IMPLANT
DRSG OPSITE POSTOP 4X8 (GAUZE/BANDAGES/DRESSINGS) IMPLANT
ELECT CAUTERY BLADE 6.4 (BLADE) ×2 IMPLANT
ELECT HOOK LOOP BIPOLAR (NEEDLE) IMPLANT
ELECTRODE REM PT RTRN 9FT ADLT (ELECTROSURGICAL) ×1 IMPLANT
GLOVE BIO SURGEON STRL SZ 6 (GLOVE) ×2 IMPLANT
GLOVE INDICATOR 6.5 STRL GRN (GLOVE) ×2 IMPLANT
GOWN STRL REUS W/ TWL LRG LVL3 (GOWN DISPOSABLE) ×4 IMPLANT
GOWN STRL REUS W/ TWL XL LVL3 (GOWN DISPOSABLE) ×2 IMPLANT
KIT TURNOVER KIT B (KITS) ×1 IMPLANT
NDL INSUFFLATION 14GA 120MM (NEEDLE) IMPLANT
NEEDLE INSUFFLATION 14GA 120MM (NEEDLE) ×1 IMPLANT
NS IRRIG 1000ML POUR BTL (IV SOLUTION) ×2 IMPLANT
PACK COLON (CUSTOM PROCEDURE TRAY) ×1 IMPLANT
PAD ARMBOARD POSITIONER FOAM (MISCELLANEOUS) ×1 IMPLANT
PENCIL BUTTON HOLSTER BLD 10FT (ELECTRODE) ×1 IMPLANT
RELOAD PROXIMATE 75MM BLUE (ENDOMECHANICALS) ×2 IMPLANT
RELOAD STAPLE 75 3.8 BLU REG (ENDOMECHANICALS) IMPLANT
SET TUBE SMOKE EVAC HIGH FLOW (TUBING) IMPLANT
SHEARS HARMONIC 36 ACE (MISCELLANEOUS) IMPLANT
SLEEVE Z-THREAD 5X100MM (TROCAR) IMPLANT
SPECIMEN JAR LARGE (MISCELLANEOUS) ×1 IMPLANT
SPONGE T-LAP 18X18 ~~LOC~~+RFID (SPONGE) IMPLANT
STAPLER GUN LINEAR PROX 60 (STAPLE) IMPLANT
STAPLER PROXIMATE 75MM BLUE (STAPLE) IMPLANT
STAPLER SKIN PROX 35W (STAPLE) ×1 IMPLANT
SURGILUBE 2OZ TUBE FLIPTOP (MISCELLANEOUS) IMPLANT
SUT MNCRL AB 4-0 PS2 18 (SUTURE) IMPLANT
SUT PDS AB 0 CT1 27 (SUTURE) IMPLANT
SUT PDS AB 1 TP1 96 (SUTURE) ×2 IMPLANT
SUT PROLENE 2 0 CT2 30 (SUTURE) IMPLANT
SUT PROLENE 2 0 KS (SUTURE) IMPLANT
SUT VIC AB 2-0 SH 18 (SUTURE) ×1 IMPLANT
SUT VIC AB 3-0 SH 18 (SUTURE) ×1 IMPLANT
SUT VIC AB 3-0 SH 27X BRD (SUTURE) IMPLANT
SUT VIC AB 3-0 SH 8-18 (SUTURE) IMPLANT
SUT VICRYL AB 2 0 TIES (SUTURE) ×1 IMPLANT
SUT VICRYL AB 3 0 TIES (SUTURE) ×1 IMPLANT
SYSTEM LAPSCP GELPORT 120MM (MISCELLANEOUS) IMPLANT
TRAY FOLEY MTR SLVR 14FR STAT (SET/KITS/TRAYS/PACK) IMPLANT
TUBE CONNECTING 12X1/4 (SUCTIONS) ×2 IMPLANT
WARMER LAPAROSCOPE (MISCELLANEOUS) IMPLANT

## 2024-01-19 NOTE — Anesthesia Procedure Notes (Signed)
 Procedure Name: Intubation Date/Time: 01/19/2024 9:33 AM  Performed by: Alphia Jasmine, CRNAPre-anesthesia Checklist: Patient identified, Emergency Drugs available, Suction available, Timeout performed and Patient being monitored Patient Re-evaluated:Patient Re-evaluated prior to induction Oxygen Delivery Method: Circle system utilized Preoxygenation: Pre-oxygenation with 100% oxygen Induction Type: IV induction, Rapid sequence and Cricoid Pressure applied Ventilation: Mask ventilation without difficulty Laryngoscope Size: Mac and 3 Grade View: Grade I Tube type: Oral Tube size: 7.0 mm Number of attempts: 1 Airway Equipment and Method: Stylet Placement Confirmation: ETT inserted through vocal cords under direct vision, positive ETCO2, CO2 detector and breath sounds checked- equal and bilateral Secured at: 22 cm Tube secured with: Tape Dental Injury: Teeth and Oropharynx as per pre-operative assessment

## 2024-01-19 NOTE — Op Note (Signed)
 Date: 01/19/24  Patient: Sherry Abbott MRN: [id number]  Preoperative Diagnosis: Cecal adenocarcinoma Postoperative Diagnosis: Same  Procedure: Laparoscopic-assisted right hemicolectomy; excision of peritoneal cyst  Surgeon: Karleen Overall, MD Assistant: Alferd Igo, PA-C  EBL: Minimal  Anesthesia: General endotracheal  Specimens: Right colon, appendix, and terminal ileum; peritoneal cyst  Indications: Sherry Abbott is a 48 yo female who was recently diagnosed with a cecal adenocarcinoma. Staging workup did not show any evidence of metastatic disease. She was referred electively to surgery, however presented to the ED with fevers and acute RLQ abdominal pain. CT did not show any signs of appendicitis or other source of infection, but given her symptoms and known cecal mass, she was admitted with plans to proceed with surgery. She underwent a bowel preparation and after a discussion of the risks and benefits of surgery, agreed to proceed with right hemicolectomy.  Findings: Mass in the cecum. Appendicoliths in the appendix. No evidence of metastatic disease within the abdomen. Exophytic cyst on the diaphragm containing serous fluid, excised.  Procedure details: Informed consent was obtained in the preoperative area prior to the procedure. The patient was brought to the operating room and placed on the table in the supine position. General anesthesia was induced and appropriate lines and drains were placed for intraoperative monitoring. Perioperative antibiotics were administered per SCIP guidelines. The abdomen was prepped and draped in the usual sterile fashion. A pre-procedure timeout was taken verifying patient identity, surgical site and procedure to be performed.  A vertical umbilical skin incision was made, the subcutaneous tissue was divided with cautery, and the fascia was opened and elevated along the linea alba. The peritoneum was opened, the peritoneal cavity was directly  visualized, and a small wound protector was placed. A Gelport cap was placed and the abdomen was insufflated. Two 5mm ports were placed in the Gelport. The abdomen was inspected with no evidence of visceral or vascular injury. Additional 5mm ports were placed in the RLQ and the RUQ. The tattooed mass was visible in the cecum. The liver was grossly normal with no evidence of metastatic disease. There was an exophytic cystic mass on the peritoneum overlying the diaphragm, near the left lateral liver, but the peritoneal lining was otherwise normal in appearance with no nodules or masses. The right colon was mobilized in a lateral to medial fashion along the line of Toldt using harmonic shears. The gastrocolic omentum was opened along the transverse colon using Harmonic shears, and the hepatic flexure was mobilized using the harmonic. The duodenum was visualized and protected. The terminal ileum was fixed to the retroperitoneum, and was mobilized using harmonic shears. Once the ascending colon was fully mobilized, the Gelport cap was removed and the right colon was exteriorized into the wound. The middle colic vessels were identified, and a mesenteric window was bluntly created along the transverse colon just distal to the right branch of the middle colic artery. The colon was cleared of fatty epiploa at this point using blunt dissection and cautery, and the colon was then divided using a 75mm GIA stapler with a blue load. Next the ileocolic pedicle was identified, and a mesenteric window was created on the terminal ileum approximately 15cm from the ileocecal valve. The intervening mesentery was divided with Harmonic shears, taking as much mesentery as was safely possible. The ileocolic vessels were isolated and a high ligation of the ileocolic pedicle was performed using a 2-0 silk suture ligature. The specimen was passed off the field and sent for routine pathology.  Next the end of the ileum was laid adjacent to the  end of the transverse colon in antiperistaltic fashion, and a silk stay suture was placed. An enterotomy was created on the antimesenteric border of the ileum, and a colotomy was created on the tenia. A side-to-side ileocolic anastomosis was created using a 75mm GIA stapler with a blue load. The staple line was visualized intraluminally and appeared hemostatic. The common enterotomy was closed with a TA60 blue stapler. A 3-0 silk suture was placed in the apex of the anastomosis to minimize tension. The anastomosis was palpated and was widely patent. The bowel was placed back into the abdomen, the Gelport was replaced and the abdomen was insufflated. The anastomosis lay in proper orientation without twisting of the mesentery, and there was no evidence of bleeding.   The cyst on the peritoneum above the liver was carefully separated from the diaphragm using harmonic shears, taking great care not to enter the diaphragm. There was colorless fluid in the cyst that appeared serous but not mucinous. The cyst was excised and sent for routine pathology. The abdomen was again inspected and appeared hemostatic. The ports and wound protector were removed and the abdomen was desufflated. New sterile drapes were placed prior to closure, and gowns and gloves were changed per the ERAS closing protocol. The fascia was closed at the umbilical incision using running 1 PDS suture. Scarpa's layer was closed with 3-0 Vicryl suture. The skin at all incisions was closed with 4-0 monocryl subcuticular suture. Dermabdond was applied.  The patient tolerated the procedure well with no apparent complications.  All counts were correct x2 at the end of the procedure. The patient was extubated and taken to PACU in stable condition.  Karleen Overall, MD 01/19/24 6:03 PM

## 2024-01-19 NOTE — Transfer of Care (Signed)
 Immediate Anesthesia Transfer of Care Note  Patient: Sherry Abbott  Procedure(s) Performed: LAPAROSCOPIC PARTIAL COLECTOMY (Right: Abdomen)  Patient Location: PACU  Anesthesia Type:General  Level of Consciousness: awake and drowsy  Airway & Oxygen Therapy: Patient Spontanous Breathing and Patient connected to face mask oxygen  Post-op Assessment: Report given to RN and Post -op Vital signs reviewed and stable  Post vital signs: Reviewed and stable  Last Vitals:  Vitals Value Taken Time  BP 130/70 01/19/24 11:48  Temp 36.6 C 01/19/24 11:48  Pulse 72 01/19/24 11:52  Resp 23 01/19/24 11:55  SpO2 100 % 01/19/24 11:52  Vitals shown include unfiled device data.  Last Pain:  Vitals:   01/19/24 0833  TempSrc:   PainSc: 0-No pain         Complications: No notable events documented.

## 2024-01-19 NOTE — Anesthesia Preprocedure Evaluation (Signed)
 Anesthesia Evaluation  Patient identified by MRN, date of birth, ID band Patient awake    Reviewed: Allergy & Precautions, NPO status , Patient's Chart, lab work & pertinent test results  History of Anesthesia Complications Negative for: history of anesthetic complications  Airway Mallampati: I  TM Distance: >3 FB Neck ROM: Full    Dental  (+) Dental Advisory Given   Pulmonary asthma , COPD,  COPD inhaler   breath sounds clear to auscultation       Cardiovascular negative cardio ROS  Rhythm:Regular Rate:Normal     Neuro/Psych  Headaches    GI/Hepatic Neg liver ROS,,,N/V with this colonic mass   Endo/Other  negative endocrine ROS    Renal/GU negative Renal ROS     Musculoskeletal  (+) Arthritis ,    Abdominal   Peds  Hematology  (+) Blood dyscrasia (Hb 9.1, plt 184k), anemia   Anesthesia Other Findings   Reproductive/Obstetrics                             Anesthesia Physical Anesthesia Plan  ASA: 2  Anesthesia Plan: General   Post-op Pain Management: Tylenol PO (pre-op)*   Induction: Intravenous and Rapid sequence  PONV Risk Score and Plan: 3 and Ondansetron , Dexamethasone and Scopolamine patch - Pre-op  Airway Management Planned: Oral ETT  Additional Equipment: None  Intra-op Plan:   Post-operative Plan: Extubation in OR  Informed Consent: I have reviewed the patients History and Physical, chart, labs and discussed the procedure including the risks, benefits and alternatives for the proposed anesthesia with the patient or authorized representative who has indicated his/her understanding and acceptance.     Dental advisory given  Plan Discussed with: CRNA and Surgeon  Anesthesia Plan Comments:        Anesthesia Quick Evaluation

## 2024-01-19 NOTE — Progress Notes (Signed)
 PROGRESS NOTE        PATIENT DETAILS Name: Sherry Abbott Age: 48 y.o. Sex: female Date of Birth: 1976/06/20 Admit Date: 01/16/2024 Admitting Physician Abbe Abate, MD PCP:Sun, Vyvyan, MD  Brief Summary: 48 year old who was 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    Significant events: 6/9>> admit to [facility]  Significant studies: 6/9>> CT abdomen/pelvis: No acute findings 6/9>> transabdominal/transvaginal ultrasound: No acute findings  Significant microbiology data: 6/9>> blood culture: No growth 6/9>> COVID/influenza/RSV PCR: Negative  Procedures: None  Consults: General surgery  Subjective: Continues to have lower abdominal pain-surgery was postponed from 6/11 to 6/12.  No other issues overnight.  Objective: Vitals: Blood pressure 101/65, pulse 62, temperature 97.8 F (36.6 C), temperature source Oral, resp. rate 17, height 5' 5 (1.651 m), weight 65.3 kg, SpO2 99%.   Exam: Awake/alert Chest: Clear to auscultation Abdomen: Soft-slightly tender-mostly in the lower abdomen Extremities: No edema Nonfocal exam  Pertinent Labs/Radiology:    Latest Ref Rng & Units 01/18/2024    6:23 AM 01/17/2024    8:20 PM 01/17/2024   12:54 PM  CBC  WBC 4.0 - 10.5 K/uL 7.6  6.6  7.0   Hemoglobin 12.0 - 15.0 g/dL 9.7  9.2  8.4   Hematocrit 36.0 - 46.0 % 28.9  27.0  25.6   Platelets 150 - 400 K/uL 190  169  147     Lab Results  Component Value Date   NA 138 01/18/2024   K 4.1 01/18/2024   CL 108 01/18/2024   CO2 22 01/18/2024      Assessment/Plan: SIRS Probably related to adenocarcinoma of cecum/appendicoliths Sepsis All cultures negative Suspect antibiotics can be discontinued postoperatively.  RLQ abdominal pain Due to cecal mass-newly diagnosed adenocarcinoma  Not felt to have appendicitis CT/ultrasound negative for acute abnormalities Continues  to have some mild lower abdominal tenderness General surgery planning right hemicolectomy 6/12.  RA Weekly methotrexate-last dose 6/6 Currently on hold until she is recovered from surgery  Asthma Stable not in exacerbation As needed bronchodilators  Normocytic anemia Hb stable Follow/trend periodically  History of sickle cell trait Supportive care.  Code status:   Code Status: Full Code   DVT Prophylaxis: Place and maintain sequential compression device Start: 01/17/24 1613 SCD's Start: 01/17/24 1205 SCDs Start: 01/17/24 0457   Family Communication: Spouse at bedside   Disposition Plan: Status is: Inpatient Remains inpatient appropriate because: Severity of illness   Planned Discharge Destination:Home   Diet: Diet Order             Diet NPO time specified  Diet effective ____                     Antimicrobial agents: Anti-infectives (From admission, onward)    Start     Dose/Rate Route Frequency Ordered Stop   01/18/24 0600  cefoTEtan (CEFOTAN) 2 g in sodium chloride 0.9 % 100 mL IVPB  Status:  Discontinued        2 g 200 mL/hr over 30 Minutes Intravenous On call to O.R. 01/17/24 1204 01/17/24 1351   01/17/24 1800  vancomycin (VANCOCIN) IVPB 750 mg/150 ml premix  Status:  Discontinued        750 mg 150 mL/hr over 60 Minutes Intravenous Every 12 hours 01/17/24 0511 01/17/24 0730  01/17/24 1800  metroNIDAZOLE (FLAGYL) IVPB 500 mg        500 mg 100 mL/hr over 60 Minutes Intravenous Every 12 hours 01/17/24 1352 01/18/24 1759   01/17/24 1000  vancomycin (VANCOREADY) IVPB 1500 mg/300 mL  Status:  Discontinued        1,500 mg 150 mL/hr over 120 Minutes Intravenous Every 12 hours 01/17/24 0514 01/17/24 0759   01/17/24 1000  vancomycin (VANCOREADY) IVPB 750 mg/150 mL  Status:  Discontinued        750 mg 150 mL/hr over 60 Minutes Intravenous Every 12 hours 01/17/24 0730 01/17/24 0759   01/17/24 0800  [MAR Hold]  metroNIDAZOLE (FLAGYL) IVPB 500 mg         (MAR Hold since Thu 01/19/2024 at 0808.Hold Reason: Transfer to a Procedural area)   500 mg 100 mL/hr over 60 Minutes Intravenous Every 12 hours 01/17/24 0459     01/17/24 0600  [MAR Hold]  ceFEPIme (MAXIPIME) 2 g in sodium chloride 0.9 % 100 mL IVPB        (MAR Hold since Thu 01/19/2024 at 0808.Hold Reason: Transfer to a Procedural area)   2 g 200 mL/hr over 30 Minutes Intravenous Every 8 hours 01/17/24 0508     01/17/24 0515  Vancomycin (VANCOCIN) 1,500 mg in sodium chloride 0.9 % 500 mL IVPB  Status:  Discontinued        1,500 mg 250 mL/hr over 120 Minutes Intravenous  Once 01/17/24 0508 01/17/24 0514   01/16/24 2030  cefTRIAXone (ROCEPHIN) 2 g in sodium chloride 0.9 % 100 mL IVPB        2 g 200 mL/hr over 30 Minutes Intravenous Once 01/16/24 2019 01/16/24 2144   01/16/24 2030  metroNIDAZOLE (FLAGYL) IVPB 500 mg        500 mg 100 mL/hr over 60 Minutes Intravenous  Once 01/16/24 2019 01/16/24 2329        MEDICATIONS: Scheduled Meds:  chlorhexidine       [MAR Hold] pantoprazole (PROTONIX) IV  40 mg Intravenous Q24H   Continuous Infusions:  [MAR Hold] ceFEPime (MAXIPIME) IV 2 g (01/19/24 0550)   [MAR Hold] metronidazole 500 mg (01/19/24 0747)   PRN Meds:.[MAR Hold] acetaminophen **OR** [MAR Hold] acetaminophen, chlorhexidine, [MAR Hold] fentaNYL (SUBLIMAZE) injection, [MAR Hold] naLOXone (NARCAN)  injection, [MAR Hold] ondansetron  (ZOFRAN ) IV   I have personally reviewed following labs and imaging studies  LABORATORY DATA: CBC: Recent Labs  Lab 01/16/24 2006 01/17/24 0516 01/17/24 1254 01/17/24 2020 01/18/24 0623  WBC 11.0* 9.5 7.0 6.6 7.6  NEUTROABS 10.1*  --   --   --   --   HGB 10.7* 9.0* 8.4* 9.2* 9.7*  HCT 31.4* 26.4* 25.6* 27.0* 28.9*  MCV 86.5 86.8 88.9 87.7 87.3  PLT 225 175 147* 169 190    Basic Metabolic Panel: Recent Labs  Lab 01/16/24 1809 01/16/24 1815 01/17/24 0516 01/18/24 0623  NA 138 138 138 138  K 3.5 3.4* 3.4* 4.1  CL 104 106 109 108  CO2  19*  --  22 22  GLUCOSE 123* 122* 98 98  BUN 7 6 5* <5*  CREATININE 0.84 0.80 0.84 0.75  CALCIUM 9.3  --  7.4* 8.2*  MG  --   --  1.7 2.2  PHOS  --   --   --  2.2*    GFR: Estimated Creatinine Clearance: 76.5 mL/min (by C-G formula based on SCr of 0.75 mg/dL).  Liver Function Tests: Recent Labs  Lab 01/16/24 1809  01/18/24 0623  AST 23 18  ALT 11 10  ALKPHOS 29* 30*  BILITOT 1.0 0.5  PROT 7.4 6.6  ALBUMIN 4.1 3.2*   Recent Labs  Lab 01/16/24 1809  LIPASE 23   No results for input(s): AMMONIA in the last 168 hours.  Coagulation Profile: Recent Labs  Lab 01/16/24 2149  INR 1.1    Cardiac Enzymes: No results for input(s): CKTOTAL, CKMB, CKMBINDEX, TROPONINI in the last 168 hours.  BNP (last 3 results) No results for input(s): PROBNP in the last 8760 hours.  Lipid Profile: No results for input(s): CHOL, HDL, LDLCALC, TRIG, CHOLHDL, LDLDIRECT in the last 72 hours.  Thyroid Function Tests: No results for input(s): TSH, T4TOTAL, FREET4, T3FREE, THYROIDAB in the last 72 hours.  Anemia Panel: No results for input(s): VITAMINB12, FOLATE, FERRITIN, TIBC, IRON, RETICCTPCT in the last 72 hours.  Urine analysis:    Component Value Date/Time   COLORURINE YELLOW 01/17/2024 0150   APPEARANCEUR CLEAR 01/17/2024 0150   LABSPEC 1.028 01/17/2024 0150   PHURINE 5.0 01/17/2024 0150   GLUCOSEU NEGATIVE 01/17/2024 0150   HGBUR NEGATIVE 01/17/2024 0150   BILIRUBINUR NEGATIVE 01/17/2024 0150   KETONESUR 5 (A) 01/17/2024 0150   PROTEINUR NEGATIVE 01/17/2024 0150   UROBILINOGEN 1.0 06/12/2008 2109   NITRITE NEGATIVE 01/17/2024 0150   LEUKOCYTESUR NEGATIVE 01/17/2024 0150    Sepsis Labs: Lactic Acid, Venous    Component Value Date/Time   LATICACIDVEN 1.2 01/16/2024 2157    MICROBIOLOGY: Recent Results (from the past 240 hours)  Culture, blood (routine x 2)     Status: None (Preliminary result)   Collection Time: 01/16/24   8:03 PM   Specimen: BLOOD  Result Value Ref Range Status   Specimen Description BLOOD SITE NOT SPECIFIED  Final   Special Requests   Final    BOTTLES DRAWN AEROBIC AND ANAEROBIC Blood Culture adequate volume   Culture   Final    NO GROWTH 3 DAYS Performed at [facility], [address], [place], [place] [address]    Report Status PENDING  Incomplete  Culture, blood (routine x 2)     Status: None (Preliminary result)   Collection Time: 01/16/24  8:06 PM   Specimen: BLOOD RIGHT ARM  Result Value Ref Range Status   Specimen Description BLOOD RIGHT ARM  Final   Special Requests   Final    BOTTLES DRAWN AEROBIC AND ANAEROBIC Blood Culture adequate volume   Culture   Final    NO GROWTH 3 DAYS Performed at [facility], [address] [place], [place] [address]    Report Status PENDING  Incomplete  Resp panel by RT-PCR (RSV, Flu A&B, Covid) Anterior Nasal Swab     Status: None   Collection Time: 01/16/24  9:49 PM   Specimen: Anterior Nasal Swab  Result Value Ref Range Status   SARS Coronavirus 2 by RT PCR NEGATIVE NEGATIVE Final   Influenza A by PCR NEGATIVE NEGATIVE Final   Influenza B by PCR NEGATIVE NEGATIVE Final    Comment: (NOTE) The Xpert Xpress SARS-CoV-2/FLU/RSV plus assay is intended as an aid in the diagnosis of influenza from Nasopharyngeal swab specimens and should not be used as a sole basis for treatment. Nasal washings and aspirates are unacceptable for Xpert Xpress SARS-CoV-2/FLU/RSV testing.  Fact Sheet for Patients: BloggerCourse.com  Fact Sheet for Healthcare Providers: SeriousBroker.it  This test is not yet approved or cleared by the [place]  FDA and has been authorized for detection  and/or diagnosis of SARS-CoV-2 by FDA under an Emergency Use Authorization (EUA). This EUA will remain in effect (meaning this test can be used) for the duration of the COVID-19 declaration under Section  564(b)(1) of the Act, 21 U.S.C. section 360bbb-3(b)(1), unless the authorization is terminated or revoked.     Resp Syncytial Virus by PCR NEGATIVE NEGATIVE Final    Comment: (NOTE) Fact Sheet for Patients: BloggerCourse.com  Fact Sheet for Healthcare Providers: SeriousBroker.it  This test is not yet approved or cleared by the [place]  FDA and has been authorized for detection and/or diagnosis of SARS-CoV-2 by FDA under an Emergency Use Authorization (EUA). This EUA will remain in effect (meaning this test can be used) for the duration of the COVID-19 declaration under Section 564(b)(1) of the Act, 21 U.S.C. section 360bbb-3(b)(1), unless the authorization is terminated or revoked.  Performed at [facility], [address], [place], [place] [address]   Surgical pcr screen     Status: None   Collection Time: 01/18/24  9:44 AM   Specimen: Nasal Mucosa; Nasal Swab  Result Value Ref Range Status   MRSA, PCR NEGATIVE NEGATIVE Final   Staphylococcus aureus NEGATIVE NEGATIVE Final    Comment: (NOTE) The Xpert SA Assay (FDA approved for NASAL specimens in patients 75 years of age and older), is one component of a comprehensive surveillance program. It is not intended to diagnose infection nor to guide or monitor treatment. Performed at [facility], [address], [place], [place] [address]     RADIOLOGY STUDIES/RESULTS: No results found.    LOS: 2 days   Kimberly Penna, MD  [facility]    To contact the attending provider between 7A-7P or the covering provider during after hours 7P-7A, please log into the web site [email or web address] and access using universal [place] password for that web site. If you do not have the password, please call the hospital operator.  01/19/2024, 8:23 AM

## 2024-01-19 NOTE — Plan of Care (Signed)
 Pt has rested quietly throughout the night with no distress noted. Alert and oriented. On room air. SR on the monitor. Up to BR to void. NPO after MN for procedure in am. No complaints voiced.     Problem: Activity: Goal: Ability to tolerate increased activity will improve Outcome: Progressing   Problem: Respiratory: Goal: Respiratory status will improve Outcome: Progressing   Problem: Education: Goal: Knowledge of General Education information will improve Description: Including pain rating scale, medication(s)/side effects and non-pharmacologic comfort measures Outcome: Progressing   Problem: Pain Managment: Goal: General experience of comfort will improve and/or be controlled Outcome: Progressing

## 2024-01-19 NOTE — Progress Notes (Signed)
  *   Day of Surgery *  Subjective: Still having RLQ pain. Reports she completed 2/3 of her bowel prep and stools were watery.   Objective: Vital signs in last 24 hours: Temp:  [97.8 F (36.6 C)-99.1 F (37.3 C)] 98 F (36.7 C) (06/12 0826) Pulse Rate:  [55-80] 55 (06/12 0826) Resp:  [12-19] 12 (06/12 0826) BP: (91-112)/(54-72) 112/72 (06/12 0826) SpO2:  [98 %-100 %] 100 % (06/12 0826) Weight:  [65.3 kg] 65.3 kg (06/12 0826) Last BM Date : 01/18/24  Intake/Output from previous day: No intake/output data recorded. Intake/Output this shift: No intake/output data recorded.  PE: General: resting comfortably, NAD Neuro: alert and oriented, no focal deficits Resp: normal work of breathing on room air CV: sinus brady 50s Abdomen: soft, nondistended, mildly tender to palpation Extremities: warm and well-perfused   Lab Results:  Recent Labs    01/17/24 2020 01/18/24 0623  WBC 6.6 7.6  HGB 9.2* 9.7*  HCT 27.0* 28.9*  PLT 169 190   BMET Recent Labs    01/17/24 0516 01/18/24 0623  NA 138 138  K 3.4* 4.1  CL 109 108  CO2 22 22  GLUCOSE 98 98  BUN 5* <5*  CREATININE 0.84 0.75  CALCIUM 7.4* 8.2*   PT/INR Recent Labs    01/16/24 2149  LABPROT 14.6  INR 1.1   CMP     Component Value Date/Time   NA 138 01/18/2024 0623   K 4.1 01/18/2024 0623   CL 108 01/18/2024 0623   CO2 22 01/18/2024 0623   GLUCOSE 98 01/18/2024 0623   BUN <5 (L) 01/18/2024 0623   CREATININE 0.75 01/18/2024 0623   CALCIUM 8.2 (L) 01/18/2024 0623   PROT 6.6 01/18/2024 0623   ALBUMIN 3.2 (L) 01/18/2024 0623   AST 18 01/18/2024 0623   ALT 10 01/18/2024 0623   ALKPHOS 30 (L) 01/18/2024 0623   BILITOT 0.5 01/18/2024 0623   GFRNONAA >60 01/18/2024 0623   GFRAA  06/12/2008 2052    >60        The eGFR has been calculated using the MDRD equation. This calculation has not been validated in all clinical   Lipase     Component Value Date/Time   LIPASE 23 01/16/2024 1809        Assessment/Plan Cecal adenocarcinoma   Admitted with RLQ abdominal pain - Proceed to OR for lap-assisted right hemicolectomy. I reviewed the procedure details with the patient including the risks of bleeding, infection and anastomotic leak. Patient expressed understanding and consents to proceed. - Adequate bowel prep completed - FEN: NPO for OR today ID - on cefepime and Flagyl    I reviewed nursing notes, ED provider notes, last 24 h vitals and pain scores, last 48 h intake and output, last 24 h labs and trends, and last 24 h imaging results.  This care required moderate level of medical decision making.    LOS: 2 days    Lujean Sake, MD [facility] 01/19/2024, 8:40 AM Please see Amion for pager number during day hours 7:00am-4:30pm

## 2024-01-20 ENCOUNTER — Encounter (HOSPITAL_COMMUNITY): Payer: Self-pay | Admitting: Surgery

## 2024-01-20 LAB — CBC
HCT: 25.8 % — ABNORMAL LOW (ref 36.0–46.0)
Hemoglobin: 8.8 g/dL — ABNORMAL LOW (ref 12.0–15.0)
MCH: 29.4 pg (ref 26.0–34.0)
MCHC: 34.1 g/dL (ref 30.0–36.0)
MCV: 86.3 fL (ref 80.0–100.0)
Platelets: 192 10*3/uL (ref 150–400)
RBC: 2.99 MIL/uL — ABNORMAL LOW (ref 3.87–5.11)
RDW: 15 % (ref 11.5–15.5)
WBC: 9.5 10*3/uL (ref 4.0–10.5)
nRBC: 0 % (ref 0.0–0.2)

## 2024-01-20 LAB — COMPREHENSIVE METABOLIC PANEL WITH GFR
ALT: 9 U/L (ref 0–44)
AST: 15 U/L (ref 15–41)
Albumin: 3 g/dL — ABNORMAL LOW (ref 3.5–5.0)
Alkaline Phosphatase: 24 U/L — ABNORMAL LOW (ref 38–126)
Anion gap: 9 (ref 5–15)
BUN: <5 mg/dL — ABNORMAL LOW (ref 6–20)
CO2: 25 mmol/L (ref 22–32)
Calcium: 8.5 mg/dL — ABNORMAL LOW (ref 8.9–10.3)
Chloride: 104 mmol/L (ref 98–111)
Creatinine, Ser: 0.73 mg/dL (ref 0.44–1.00)
GFR, Estimated: >60 mL/min (ref >60)
Glucose, Bld: 93 mg/dL (ref 70–99)
Potassium: 3.6 mmol/L (ref 3.5–5.1)
Sodium: 138 mmol/L (ref 135–145)
Total Bilirubin: 0.9 mg/dL (ref 0.0–1.2)
Total Protein: 6 g/dL — ABNORMAL LOW (ref 6.5–8.1)

## 2024-01-20 LAB — IRON AND TIBC
Iron: 18 ug/dL — ABNORMAL LOW (ref 28–170)
Saturation Ratios: 6 % — ABNORMAL LOW (ref 10.4–31.8)
TIBC: 314 ug/dL (ref 250–450)
UIBC: 296 ug/dL

## 2024-01-20 LAB — MAGNESIUM: Magnesium: 2 mg/dL (ref 1.7–2.4)

## 2024-01-20 LAB — PHOSPHORUS: Phosphorus: 3 mg/dL (ref 2.5–4.6)

## 2024-01-20 LAB — FOLATE: Folate: 16.7 ng/mL (ref >5.9)

## 2024-01-20 LAB — VITAMIN B12: Vitamin B-12: 1046 pg/mL — ABNORMAL HIGH (ref 180–914)

## 2024-01-20 LAB — VITAMIN D 25 HYDROXY (VIT D DEFICIENCY, FRACTURES): Vit D, 25-Hydroxy: 28.63 ng/mL — ABNORMAL LOW (ref 30–100)

## 2024-01-20 MED ORDER — CHLORHEXIDINE GLUCONATE CLOTH 2 % EX PADS
6.0000 | MEDICATED_PAD | Freq: Every day | CUTANEOUS | Status: DC
  Administered 2024-01-20 – 2024-01-22 (×3): 6 via TOPICAL

## 2024-01-20 MED ORDER — POLYSACCHARIDE IRON COMPLEX 150 MG PO CAPS
150.0000 mg | ORAL_CAPSULE | Freq: Every day | ORAL | Status: DC
  Administered 2024-01-20 – 2024-01-22 (×3): 150 mg via ORAL
  Filled 2024-01-20 (×4): qty 1

## 2024-01-20 MED ORDER — VITAMIN D (ERGOCALCIFEROL) 1.25 MG (50000 UNIT) PO CAPS
50000.0000 [IU] | ORAL_CAPSULE | ORAL | Status: DC
  Administered 2024-01-20: 50000 [IU] via ORAL
  Filled 2024-01-20: qty 1

## 2024-01-20 MED ORDER — LACTATED RINGERS IV SOLN: INTRAVENOUS | Status: DC

## 2024-01-20 MED ORDER — KETOROLAC TROMETHAMINE 15 MG/ML IJ SOLN
15.0000 mg | Freq: Three times a day (TID) | INTRAMUSCULAR | Status: DC
  Administered 2024-01-20 – 2024-01-22 (×8): 15 mg via INTRAVENOUS
  Filled 2024-01-20 (×8): qty 1

## 2024-01-20 MED ORDER — METHOCARBAMOL 1000 MG/10ML IJ SOLN
1000.0000 mg | Freq: Three times a day (TID) | INTRAMUSCULAR | Status: DC
  Administered 2024-01-20 – 2024-01-21 (×3): 1000 mg via INTRAVENOUS
  Filled 2024-01-20 (×3): qty 10

## 2024-01-20 MED ORDER — VITAMIN C 500 MG PO TABS
500.0000 mg | ORAL_TABLET | Freq: Every day | ORAL | Status: DC
  Administered 2024-01-20 – 2024-01-22 (×3): 500 mg via ORAL
  Filled 2024-01-20 (×4): qty 1

## 2024-01-20 NOTE — Progress Notes (Signed)
 Morphine  given for pain. Pt wanted to be pulled up in bed to be able to eat her clear liquid tray. Got another nurse and when moving pt up in bed she proceeded to scream at the top of her lungs in pain. Explained to  pt that she needs to start moving some and deep breath and cough but pt and husband not wanting to listen, they want the doctor called because she is in pain. Dr. [facility] notified. Then he wanted me to call surgeon.

## 2024-01-20 NOTE — Plan of Care (Signed)

## 2024-01-20 NOTE — Plan of Care (Signed)
 Pt has rested quietly throughout the night with no distress noted. Alert and oriented. On room air. SR on the monitor. Foley cath intact to BSD yellow urine. Medicated twice with dilaudid , once with morphine  and once with oxy with relief noted. No other complaints voiced.     Problem: Clinical Measurements: Goal: Respiratory complications will improve Outcome: Progressing Goal: Cardiovascular complication will be avoided Outcome: Progressing   Problem: Activity: Goal: Risk for activity intolerance will decrease Outcome: Progressing   Problem: Coping: Goal: Level of anxiety will decrease Outcome: Progressing   Problem: Pain Managment: Goal: General experience of comfort will improve and/or be controlled Outcome: Progressing

## 2024-01-20 NOTE — Discharge Instructions (Signed)
 [facility]      [facility], [place] [phone]  OPEN ABDOMINAL SURGERY: POST OP INSTRUCTIONS  Always review your discharge instruction sheet given to you by the facility where your surgery was performed.  IF YOU HAVE DISABILITY OR FAMILY LEAVE FORMS, YOU MUST BRING THEM TO THE OFFICE FOR PROCESSING.  PLEASE DO NOT GIVE THEM TO YOUR DOCTOR.  A prescription for pain medication may be given to you upon discharge.  Take your pain medication as prescribed, if needed.  If narcotic pain medicine is not needed, then you may take acetaminophen  (Tylenol ) or ibuprofen  (Advil ) as needed. Take your usually prescribed medications unless otherwise directed. If you need a refill on your pain medication, please contact your pharmacy. They will contact our office to request authorization.  Prescriptions will not be filled after 5pm or on week-ends. You should follow a light diet the first few days after arrival home, such as soup and crackers, pudding, etc.unless your doctor has advised otherwise. A high-fiber, low fat diet can be resumed as tolerated.   Be sure to include lots of fluids daily. Most patients will experience some swelling and bruising on the chest and neck area.  Ice packs will help.  Swelling and bruising can take several days to resolve Most patients will experience some swelling and bruising in the area of the incision. Ice pack will help. Swelling and bruising can take several days to resolve..  It is common to experience some constipation if taking pain medication after surgery.  Increasing fluid intake and taking a stool softener will usually help or prevent this problem from occurring.  A mild laxative (Milk of Magnesia or Miralax ) should be taken according to package directions if there are no bowel movements after 48 hours.  You may have steri-strips (small skin tapes) in place directly over the incision.  These strips should be left on the skin for 7-10 days.  If your surgeon used skin  glue on the incision, you may shower in 24 hours.  The glue will flake off over the next 2-3 weeks.  Any sutures or staples will be removed at the office during your follow-up visit. You may find that a light gauze bandage over your incision may keep your staples from being rubbed or pulled. You may shower and replace the bandage daily. ACTIVITIES:  You may resume regular (light) daily activities beginning the next day--such as daily self-care, walking, climbing stairs--gradually increasing activities as tolerated.  You may have sexual intercourse when it is comfortable.  Refrain from any heavy lifting or straining until approved by your doctor. You may drive when you no longer are taking prescription pain medication, you can comfortably wear a seatbelt, and you can safely maneuver your car and apply brakes Return to Work: ___________________________________ Elene Griffes should see your doctor in the office for a follow-up appointment approximately two weeks after your surgery.  Make sure that you call for this appointment within a day or two after you arrive home to insure a convenient appointment time. OTHER INSTRUCTIONS:  _____________________________________________________________ _____________________________________________________________  WHEN TO CALL YOUR DOCTOR: Fever over 101.0 Inability to urinate Nausea and/or vomiting Extreme swelling or bruising Continued bleeding from incision. Increased pain, redness, or drainage from the incision. Difficulty swallowing or breathing Muscle cramping or spasms. Numbness or tingling in hands or feet or around lips.  The clinic staff is available to answer your questions during regular business hours.  Please don't hesitate to call and ask to speak to one of  the nurses if you have concerns.  For further questions, please visit [email or web address]    Follow instructions provided by general surgery.  Follow with Primary MD Sun, Vyvyan, MD in  7 days, kindly follow final pathology results.  Please arrange for outpatient oncology follow-up in 1 to 2 weeks postdischarge.  Get CBC, CMP,  -  checked next visit with your primary MD   Activity: As tolerated with Full fall precautions use walker/cane & assistance as needed  Disposition Home    Diet: Heart Healthy    Special Instructions: If you have smoked or chewed Tobacco  in the last 2 yrs please stop smoking, stop any regular Alcohol  and or any Recreational drug use.  On your next visit with your primary care physician please Get Medicines reviewed and adjusted.  Please request your Prim.MD to go over all Hospital Tests and Procedure/Radiological results at the follow up, please get all Hospital records sent to your Prim MD by signing hospital release before you go home.  If you experience worsening of your admission symptoms, develop shortness of breath, life threatening emergency, suicidal or homicidal thoughts you must seek medical attention immediately by calling 911 or calling your MD immediately  if symptoms less severe.  You Must read complete instructions/literature along with all the possible adverse reactions/side effects for all the Medicines you take and that have been prescribed to you. Take any new Medicines after you have completely understood and accpet all the possible adverse reactions/side effects.   Do not drive when taking Pain medications.  Do not take more than prescribed Pain, Sleep and Anxiety Medications  Wear Seat belts while driving.

## 2024-01-20 NOTE — Progress Notes (Signed)
 Surgeon returned call and adjusting meds. Dilaudid  1 mg given IV with relief noted. Mont Antis (Press photographer) also spoke with pt and husband.

## 2024-01-20 NOTE — Progress Notes (Signed)
 PROGRESS NOTE        PATIENT DETAILS Name: Sherry Abbott Age: 48 y.o. 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    Significant events: 6/9>> admit to [facility]  Significant studies: 6/9>> CT abdomen/pelvis: No acute findings 6/9>> transabdominal/transvaginal ultrasound: No acute findings  Significant microbiology data: 6/9>> blood culture: No growth 6/9>> COVID/influenza/RSV PCR: Negative  Procedures: 6/12 >> S/p lap-assisted right hemicolectomy   Consults: General surgery  Subjective: Patient was seen and examined at bedside during morning rounds.  No significant overnight events, patient had abdominal pain 6/10 which improved after pain medications, currently at 3/10.  Still no bowel movements and she is not passing gas. Patient has been started on IV fluid for hydration and clear liquid diet.   Objective: Vitals: Blood pressure 111/61, pulse 78, temperature 98.2 F (36.8 C), temperature source Oral, resp. rate 14, height 5' 5 (1.651 m), weight 72.9 kg, SpO2 97%.   Exam: Awake/alert Chest: Clear to auscultation Abdomen: BS positive, mild generalized tenderness, ND Extremities: No edema Nonfocal exam  Pertinent Labs/Radiology:    Latest Ref Rng & Units 01/20/2024    6:56 AM 01/19/2024    7:39 AM 01/18/2024    6:23 AM  CBC  WBC 4.0 - 10.5 K/uL 9.5  4.8  7.6   Hemoglobin 12.0 - 15.0 g/dL 8.8  9.1  9.7   Hematocrit 36.0 - 46.0 % 25.8  26.9  28.9   Platelets 150 - 400 K/uL 192  184  190     Lab Results  Component Value Date   NA 138 01/20/2024   K 3.6 01/20/2024   CL 104 01/20/2024   CO2 25 01/20/2024      Assessment/Plan: SIRS Probably related to adenocarcinoma of  cecum/appendicoliths Sepsis All cultures negative Suspect antibiotics can be discontinued postoperatively.  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  Patient presented with RLQ abdominal pain, found to have cecal mass, no appendicitis CT/ultrasound negative for acute abnormalities Continues to have some mild lower abdominal tenderness General surgery consulted, s/p right hemicolectomy done on 6/12. 6/13 continue IV fluid for hydration, started cardiac diet, advance as per tolerance.  RA Weekly methotrexate-last dose 6/6 Currently on hold until she is recovered from surgery  Asthma Stable not in exacerbation As needed bronchodilators  Anemia due to iron deficiency, transferrin saturation 6% Folate WNL, B12 elevated Started oral iron supplement with vitamin C Follow-up with PCP to repeat iron profile after 3 to 6 months 6/13 Hb 8.8, stable, monitor  Vitamin D insufficiency: started vitamin D 50,000 units p.o. weekly, follow with PCP to repeat vitamin D level after 3 to 6 months.   History of sickle cell trait Supportive care.  Code status:   Code Status: Full Code   DVT Prophylaxis: enoxaparin  (LOVENOX ) injection 40 mg Start: 01/20/24 0800 SCD's Start: 01/19/24 1600 Place TED hose Start: 01/19/24 1600 Place and maintain sequential compression device Start: 01/17/24 1613 SCDs Start: 01/17/24 0457   Family Communication: Spouse at bedside   Disposition Plan: Status is: Inpatient Remains inpatient appropriate because: Severity of illness   Planned Discharge Destination:Home   Diet: Diet Order  Diet full liquid Room service appropriate? Yes; Fluid consistency: Thin  Diet effective now                     Antimicrobial agents: Anti-infectives (From admission, onward)    Start     Dose/Rate Route Frequency Ordered Stop   01/18/24 0600  cefoTEtan (CEFOTAN) 2 g in sodium chloride  0.9 % 100 mL IVPB  Status:   Discontinued        2 g 200 mL/hr over 30 Minutes Intravenous On call to O.R. 01/17/24 1204 01/17/24 1351   01/17/24 1800  vancomycin  (VANCOCIN ) IVPB 750 mg/150 ml premix  Status:  Discontinued        750 mg 150 mL/hr over 60 Minutes Intravenous Every 12 hours 01/17/24 0511 01/17/24 0730   01/17/24 1800  metroNIDAZOLE  (FLAGYL ) IVPB 500 mg        500 mg 100 mL/hr over 60 Minutes Intravenous Every 12 hours 01/17/24 1352 01/18/24 1759   01/17/24 1000  vancomycin  (VANCOREADY) IVPB 1500 mg/300 mL  Status:  Discontinued        1,500 mg 150 mL/hr over 120 Minutes Intravenous Every 12 hours 01/17/24 0514 01/17/24 0759   01/17/24 1000  vancomycin  (VANCOREADY) IVPB 750 mg/150 mL  Status:  Discontinued        750 mg 150 mL/hr over 60 Minutes Intravenous Every 12 hours 01/17/24 0730 01/17/24 0759   01/17/24 0800  metroNIDAZOLE  (FLAGYL ) IVPB 500 mg  Status:  Discontinued        500 mg 100 mL/hr over 60 Minutes Intravenous Every 12 hours 01/17/24 0459 01/19/24 1438   01/17/24 0600  ceFEPIme  (MAXIPIME ) 2 g in sodium chloride  0.9 % 100 mL IVPB  Status:  Discontinued        2 g 200 mL/hr over 30 Minutes Intravenous Every 8 hours 01/17/24 0508 01/19/24 1438   01/17/24 0515  Vancomycin  (VANCOCIN ) 1,500 mg in sodium chloride  0.9 % 500 mL IVPB  Status:  Discontinued        1,500 mg 250 mL/hr over 120 Minutes Intravenous  Once 01/17/24 0508 01/17/24 0514   01/16/24 2030  cefTRIAXone  (ROCEPHIN ) 2 g in sodium chloride  0.9 % 100 mL IVPB        2 g 200 mL/hr over 30 Minutes Intravenous Once 01/16/24 2019 01/16/24 2144   01/16/24 2030  metroNIDAZOLE  (FLAGYL ) IVPB 500 mg        500 mg 100 mL/hr over 60 Minutes Intravenous  Once 01/16/24 2019 01/16/24 2329        MEDICATIONS: Scheduled Meds:  acetaminophen   1,000 mg Oral Q6H   enoxaparin  (LOVENOX ) injection  40 mg Subcutaneous Q24H   feeding supplement  1 Container Oral TID BM   ketorolac   15 mg Intravenous Q8H   methocarbamol  (ROBAXIN ) injection  1,000  mg Intravenous Q8H   pantoprazole  (PROTONIX ) IV  40 mg Intravenous Q24H   Continuous Infusions:  lactated ringers  40 mL/hr at 01/20/24 1016   PRN Meds:.[DISCONTINUED] acetaminophen  **OR** acetaminophen , diphenhydrAMINE  **OR** diphenhydrAMINE , HYDROmorphone  (DILAUDID ) injection, naLOXone  (NARCAN )  injection, ondansetron  (ZOFRAN ) IV, oxyCODONE , prochlorperazine  **OR** prochlorperazine    I have personally reviewed following labs and imaging studies  LABORATORY DATA: CBC: Recent Labs  Lab 01/16/24 2006 01/17/24 0516 01/17/24 1254 01/17/24 2020 01/18/24 0623 01/19/24 0739 01/20/24 0656  WBC 11.0*   < > 7.0 6.6 7.6 4.8 9.5  NEUTROABS 10.1*  --   --   --   --   --   --  HGB 10.7*   < > 8.4* 9.2* 9.7* 9.1* 8.8*  HCT 31.4*   < > 25.6* 27.0* 28.9* 26.9* 25.8*  MCV 86.5   < > 88.9 87.7 87.3 86.2 86.3  PLT 225   < > 147* 169 190 184 192   < > = values in this interval not displayed.    Basic Metabolic Panel: Recent Labs  Lab 01/16/24 1809 01/16/24 1815 01/17/24 0516 01/18/24 0623 01/19/24 0739 01/20/24 0656 01/20/24 1036  NA 138 138 138 138 141 138  --   K 3.5 3.4* 3.4* 4.1 3.7 3.6  --   CL 104 106 109 108 109 104  --   CO2 19*  --  22 22 25 25   --   GLUCOSE 123* 122* 98 98 99 93  --   BUN 7 6 5* <5* <5* <5*  --   CREATININE 0.84 0.80 0.84 0.75 0.94 0.73  --   CALCIUM 9.3  --  7.4* 8.2* 8.6* 8.5*  --   MG  --   --  1.7 2.2 1.9  --  2.0  PHOS  --   --   --  2.2* 3.8  --  3.0    GFR: Estimated Creatinine Clearance: 85.1 mL/min (by C-G formula based on SCr of 0.73 mg/dL).  Liver Function Tests: Recent Labs  Lab 01/16/24 1809 01/18/24 0623 01/19/24 0739 01/20/24 0656  AST 23 18 14* 15  ALT 11 10 9 9   ALKPHOS 29* 30* 24* 24*  BILITOT 1.0 0.5 0.8 0.9  PROT 7.4 6.6 6.1* 6.0*  ALBUMIN  4.1 3.2* 3.0* 3.0*   Recent Labs  Lab 01/16/24 1809  LIPASE 23   No results for input(s): AMMONIA in the last 168 hours.  Coagulation Profile: Recent Labs  Lab  01/16/24 2149  INR 1.1    Cardiac Enzymes: No results for input(s): CKTOTAL, CKMB, CKMBINDEX, TROPONINI in the last 168 hours.  BNP (last 3 results) No results for input(s): PROBNP in the last 8760 hours.  Lipid Profile: No results for input(s): CHOL, HDL, LDLCALC, TRIG, CHOLHDL, LDLDIRECT in the last 72 hours.  Thyroid Function Tests: No results for input(s): TSH, T4TOTAL, FREET4, T3FREE, THYROIDAB in the last 72 hours.  Anemia Panel: Recent Labs    01/20/24 1036  VITAMINB12 1,046*  FOLATE 16.7  TIBC 314  IRON 18*    Urine analysis:    Component Value Date/Time   COLORURINE YELLOW 01/17/2024 0150   APPEARANCEUR CLEAR 01/17/2024 0150   LABSPEC 1.028 01/17/2024 0150   PHURINE 5.0 01/17/2024 0150   GLUCOSEU NEGATIVE 01/17/2024 0150   HGBUR NEGATIVE 01/17/2024 0150   BILIRUBINUR NEGATIVE 01/17/2024 0150   KETONESUR 5 (A) 01/17/2024 0150   PROTEINUR NEGATIVE 01/17/2024 0150   UROBILINOGEN 1.0 06/12/2008 2109   NITRITE NEGATIVE 01/17/2024 0150   LEUKOCYTESUR NEGATIVE 01/17/2024 0150    Sepsis Labs: Lactic Acid, Venous    Component Value Date/Time   LATICACIDVEN 1.2 01/16/2024 2157    MICROBIOLOGY: Recent Results (from the past 240 hours)  Culture, blood (routine x 2)     Status: None (Preliminary result)   Collection Time: 01/16/24  8:03 PM   Specimen: BLOOD  Result Value Ref Range Status   Specimen Description BLOOD SITE NOT SPECIFIED  Final   Special Requests   Final    BOTTLES DRAWN AEROBIC AND ANAEROBIC Blood Culture adequate volume   Culture   Final    NO GROWTH 4 DAYS Performed at [facility], [address] [place], [place] [address]    Report Status PENDING  Incomplete  Culture, blood (routine x 2)     Status: None (Preliminary result)   Collection Time: 01/16/24  8:06 PM   Specimen: BLOOD RIGHT ARM  Result Value Ref Range Status   Specimen Description BLOOD RIGHT ARM  Final   Special Requests   Final     BOTTLES DRAWN AEROBIC AND ANAEROBIC Blood Culture adequate volume   Culture   Final    NO GROWTH 4 DAYS Performed at [facility], [address], [place], [place] [address]    Report Status PENDING  Incomplete  Resp panel by RT-PCR (RSV, Flu A&B, Covid) Anterior Nasal Swab     Status: None   Collection Time: 01/16/24  9:49 PM   Specimen: Anterior Nasal Swab  Result Value Ref Range Status   SARS Coronavirus 2 by RT PCR NEGATIVE NEGATIVE Final   Influenza A by PCR NEGATIVE NEGATIVE Final   Influenza B by PCR NEGATIVE NEGATIVE Final    Comment: (NOTE) The Xpert Xpress SARS-CoV-2/FLU/RSV plus assay is intended as an aid in the diagnosis of influenza from Nasopharyngeal swab specimens and should not be used as a sole basis for treatment. Nasal washings and aspirates are unacceptable for Xpert Xpress SARS-CoV-2/FLU/RSV testing.  Fact Sheet for Patients: BloggerCourse.com  Fact Sheet for Healthcare Providers: SeriousBroker.it  This test is not yet approved or cleared by the [place]  FDA and has been authorized for detection and/or diagnosis of SARS-CoV-2 by FDA under an Emergency Use Authorization (EUA). This EUA will remain in effect (meaning this test can be used) for the duration of the COVID-19 declaration under Section 564(b)(1) of the Act, 21 U.S.C. section 360bbb-3(b)(1), unless the authorization is terminated or revoked.     Resp Syncytial Virus by PCR NEGATIVE NEGATIVE Final    Comment: (NOTE) Fact Sheet for Patients: BloggerCourse.com  Fact Sheet for Healthcare Providers: SeriousBroker.it  This test is not yet approved or cleared by the [place]  FDA and has been authorized for detection and/or diagnosis of SARS-CoV-2 by FDA under an Emergency Use Authorization (EUA). This EUA will remain in effect (meaning this test can be used) for the duration of  the COVID-19 declaration under Section 564(b)(1) of the Act, 21 U.S.C. section 360bbb-3(b)(1), unless the authorization is terminated or revoked.  Performed at [facility], [address] [place], [place] [address]   Surgical pcr screen     Status: None   Collection Time: 01/18/24  9:44 AM   Specimen: Nasal Mucosa; Nasal Swab  Result Value Ref Range Status   MRSA, PCR NEGATIVE NEGATIVE Final   Staphylococcus aureus NEGATIVE NEGATIVE Final    Comment: (NOTE) The Xpert SA Assay (FDA approved for NASAL specimens in patients 59 years of age and older), is one component of a comprehensive surveillance program. It is not intended to diagnose infection nor to guide or monitor treatment. Performed at [facility], [address], [place], [place] [address]     RADIOLOGY STUDIES/RESULTS: No results found.    LOS: 3 days   Althia Atlas, MD  [facility]    To contact the attending provider between 7A-7P or the covering provider during after hours 7P-7A, please log into the web site [email or web address] and access using universal [place] password for that web site. If you do not have the password, please call the hospital operator.  01/20/2024, 1:38 PM

## 2024-01-20 NOTE — Progress Notes (Signed)
    1 Day Post-Op  Subjective: Had difficulty with pain control overnight. Mild nausea, no vomiting.  Objective: Vital signs in last 24 hours: Temp:  [97.9 F (36.6 C)-98.8 F (37.1 C)] 98.4 F (36.9 C) (06/13 0946) Pulse Rate:  [66-102] 66 (06/13 0946) Resp:  [10-24] 12 (06/13 0946) BP: (103-142)/(70-82) 103/73 (06/13 0946) SpO2:  [94 %-100 %] 96 % (06/13 0946) Weight:  [72.9 kg] 72.9 kg (06/13 0700) Last BM Date : 01/18/24  Intake/Output from previous day: 06/12 0701 - 06/13 0700 In: 2000 [I.V.:1650; IV Piggyback:350] Out: 2825 [Urine:2800; Blood:25] Intake/Output this shift: No intake/output data recorded.  PE: General: resting comfortably, NAD Neuro: alert and oriented, no focal deficits Resp: normal work of breathing on room air Abdomen: soft, nondistended, appropriate tender to palpation. Incisions clean and dry with no eythema, induration or drainage. Extremities: warm and well-perfused   Lab Results:  Recent Labs    01/19/24 0739 01/20/24 0656  WBC 4.8 9.5  HGB 9.1* 8.8*  HCT 26.9* 25.8*  PLT 184 192   BMET Recent Labs    01/19/24 0739 01/20/24 0656  NA 141 138  K 3.7 3.6  CL 109 104  CO2 25 25  GLUCOSE 99 93  BUN <5* <5*  CREATININE 0.94 0.73  CALCIUM 8.6* 8.5*   PT/INR No results for input(s): LABPROT, INR in the last 72 hours.  CMP     Component Value Date/Time   NA 138 01/20/2024 0656   K 3.6 01/20/2024 0656   CL 104 01/20/2024 0656   CO2 25 01/20/2024 0656   GLUCOSE 93 01/20/2024 0656   BUN <5 (L) 01/20/2024 0656   CREATININE 0.73 01/20/2024 0656   CALCIUM 8.5 (L) 01/20/2024 0656   PROT 6.0 (L) 01/20/2024 0656   ALBUMIN  3.0 (L) 01/20/2024 0656   AST 15 01/20/2024 0656   ALT 9 01/20/2024 0656   ALKPHOS 24 (L) 01/20/2024 0656   BILITOT 0.9 01/20/2024 0656   GFRNONAA >60 01/20/2024 0656   GFRAA  06/12/2008 2052    >60        The eGFR has been calculated using the MDRD equation. This calculation has not been validated in  all clinical   Lipase     Component Value Date/Time   LIPASE 23 01/16/2024 1809       Assessment/Plan Cecal adenocarcinoma   Admitted with RLQ abdominal pain - S/p lap-assisted right hemicolectomy 6/12 - Advance to full liquids, continue advancing as tolerated - Added toradol  and scheduled robaxin  for pain. Continue scheduled tylenol , prn oxycodone  and dilaudid . - ID: no postop antibiotics indicated - Mobilize    I reviewed nursing notes, ED provider notes, last 24 h vitals and pain scores, last 48 h intake and output, last 24 h labs and trends, and last 24 h imaging results.  This care required moderate level of medical decision making.    LOS: 3 days    Lujean Sake, MD [facility] 01/20/2024, 10:56 AM Please see Amion for pager number during day hours 7:00am-4:30pm

## 2024-01-21 ENCOUNTER — Encounter (HOSPITAL_COMMUNITY): Payer: Self-pay | Admitting: Internal Medicine

## 2024-01-21 DIAGNOSIS — R1031 Right lower quadrant pain: Secondary | ICD-10-CM | POA: Diagnosis not present

## 2024-01-21 LAB — PHOSPHORUS: Phosphorus: 2.7 mg/dL (ref 2.5–4.6)

## 2024-01-21 LAB — BASIC METABOLIC PANEL WITH GFR
Anion gap: 9 (ref 5–15)
BUN: 5 mg/dL — ABNORMAL LOW (ref 6–20)
CO2: 26 mmol/L (ref 22–32)
Calcium: 8.5 mg/dL — ABNORMAL LOW (ref 8.9–10.3)
Chloride: 104 mmol/L (ref 98–111)
Creatinine, Ser: 0.74 mg/dL (ref 0.44–1.00)
GFR, Estimated: >60 mL/min (ref >60)
Glucose, Bld: 86 mg/dL (ref 70–99)
Potassium: 3.5 mmol/L (ref 3.5–5.1)
Sodium: 139 mmol/L (ref 135–145)

## 2024-01-21 LAB — CBC
HCT: 26 % — ABNORMAL LOW (ref 36.0–46.0)
Hemoglobin: 8.9 g/dL — ABNORMAL LOW (ref 12.0–15.0)
MCH: 29.5 pg (ref 26.0–34.0)
MCHC: 34.2 g/dL (ref 30.0–36.0)
MCV: 86.1 fL (ref 80.0–100.0)
Platelets: 195 10*3/uL (ref 150–400)
RBC: 3.02 MIL/uL — ABNORMAL LOW (ref 3.87–5.11)
RDW: 15.2 % (ref 11.5–15.5)
WBC: 5.7 10*3/uL (ref 4.0–10.5)
nRBC: 0 % (ref 0.0–0.2)

## 2024-01-21 LAB — CULTURE, BLOOD (ROUTINE X 2)
Culture: NO GROWTH
Culture: NO GROWTH
Special Requests: ADEQUATE
Special Requests: ADEQUATE

## 2024-01-21 LAB — MAGNESIUM: Magnesium: 1.9 mg/dL (ref 1.7–2.4)

## 2024-01-21 MED ORDER — LACTATED RINGERS IV SOLN: INTRAVENOUS | Status: AC

## 2024-01-21 MED ORDER — HYDROMORPHONE HCL 1 MG/ML IJ SOLN
0.5000 mg | INTRAMUSCULAR | Status: DC | PRN
  Administered 2024-01-21 (×2): 0.5 mg via INTRAVENOUS
  Filled 2024-01-21 (×2): qty 0.5

## 2024-01-21 MED ORDER — POTASSIUM CHLORIDE CRYS ER 20 MEQ PO TBCR
40.0000 meq | EXTENDED_RELEASE_TABLET | Freq: Once | ORAL | Status: AC
  Administered 2024-01-21: 40 meq via ORAL
  Filled 2024-01-21: qty 2

## 2024-01-21 NOTE — Progress Notes (Signed)
 PROGRESS NOTE        PATIENT DETAILS Name: Sherry Abbott Age: 48 y.o. 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    Significant events: 6/9>> admit to [facility]  Significant studies: 6/9>> CT abdomen/pelvis: No acute findings 6/9>> transabdominal/transvaginal ultrasound: No acute findings  Significant microbiology data: 6/9>> blood culture: No growth 6/9>> COVID/influenza/RSV PCR: Negative  Procedures: 6/12 >> S/p lap-assisted right hemicolectomy   Consults: General surgery  Subjective:   Patient in bed, appears comfortable, denies any headache, no fever, no chest pain or pressure, no shortness of breath , no abdominal pain. No new focal weakness.   Objective: Vitals: Blood pressure 114/69, pulse 72, temperature 98 F (36.7 C), temperature source Oral, resp. rate 15, height 5' 5 (1.651 m), weight 72.9 kg, SpO2 98%.   Exam:  Awake Alert, No new F.N deficits, Normal affect [place].AT,PERRAL Supple Neck, No JVD,   Symmetrical Chest wall movement, Good air movement bilaterally, CTAB RRR,No Gallops, Rubs or new Murmurs,  +ve B.Sounds, Abd Soft, No tenderness,   No Cyanosis, Clubbing or edema    Assessment/Plan: SIRS Probably related to adenocarcinoma of cecum/appendicoliths Sepsis All cultures negative She is now off of antibiotics.  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  Patient presented with RLQ abdominal pain, found to have cecal mass, no appendicitis CT/ultrasound negative for acute abnormalities Continues to have some mild lower abdominal tenderness General surgery consulted, s/p right hemicolectomy done on 6/12. 6/13 continue IV fluid for hydration, started  cardiac diet, advance as per tolerance, will defer this to general surgery.  Postdischarge follow-up with oncology.  Surgical pathology pending.  RA Weekly methotrexate-last dose 6/6 Currently on hold until she is recovered from surgery  Asthma Stable not in exacerbation As needed bronchodilators  Anemia due to iron deficiency, transferrin saturation 6% Folate WNL, B12 elevated Started oral iron supplement with vitamin C Follow-up with PCP to repeat iron profile after 3 to 6 months 6/13 Hb 8.8, stable, monitor  Vitamin D insufficiency: started vitamin D 50,000 units p.o. weekly, follow with PCP to repeat vitamin D level after 3 to 6 months.   History of sickle cell trait Supportive care.  Code status:   Code Status: Full Code   DVT Prophylaxis: enoxaparin  (LOVENOX ) injection 40 mg Start: 01/20/24 0800 SCD's Start: 01/19/24 1600 Place TED hose Start: 01/19/24 1600 Place and maintain sequential compression device Start: 01/17/24 1613 SCDs Start: 01/17/24 0457   Family Communication: Spouse at bedside   Disposition Plan: Status is: Inpatient Remains inpatient appropriate because: Severity of illness   Planned Discharge Destination:Home   Diet: Diet Order             Diet full liquid Room service appropriate? Yes; Fluid consistency: Thin  Diet effective now                      Data Review:   Inpatient Medications  Scheduled Meds:  acetaminophen   1,000 mg Oral Q6H   ascorbic acid  500 mg Oral Daily   Chlorhexidine  Gluconate Cloth  6 each Topical Daily   enoxaparin  (LOVENOX ) injection  40 mg Subcutaneous Q24H   feeding supplement  1 Container Oral TID BM   iron polysaccharides  150 mg Oral Daily   ketorolac   15 mg Intravenous Q8H   pantoprazole  (PROTONIX ) IV  40 mg Intravenous Q24H   potassium chloride   40 mEq Oral Once   Vitamin D (Ergocalciferol)  50,000 Units Oral Q7 days   Continuous Infusions:  lactated ringers      PRN Meds:.[DISCONTINUED]  acetaminophen  **OR** acetaminophen , HYDROmorphone  (DILAUDID ) injection, naLOXone  (NARCAN )  injection, ondansetron  (ZOFRAN ) IV, oxyCODONE , prochlorperazine  **OR** prochlorperazine   DVT Prophylaxis  enoxaparin  (LOVENOX ) injection 40 mg Start: 01/20/24 0800 SCD's Start: 01/19/24 1600 Place TED hose Start: 01/19/24 1600 Place and maintain sequential compression device Start: 01/17/24 1613 SCDs Start: 01/17/24 0457       Recent Labs  Lab 01/16/24 2006 01/17/24 0516 01/17/24 2020 01/18/24 0623 01/19/24 0739 01/20/24 0656 01/21/24 0708  WBC 11.0*   < > 6.6 7.6 4.8 9.5 5.7  HGB 10.7*   < > 9.2* 9.7* 9.1* 8.8* 8.9*  HCT 31.4*   < > 27.0* 28.9* 26.9* 25.8* 26.0*  PLT 225   < > 169 190 184 192 195  MCV 86.5   < > 87.7 87.3 86.2 86.3 86.1  MCH 29.5   < > 29.9 29.3 29.2 29.4 29.5  MCHC 34.1   < > 34.1 33.6 33.8 34.1 34.2  RDW 14.8   < > 15.5 15.5 15.2 15.0 15.2  LYMPHSABS 0.4*  --   --   --   --   --   --   MONOABS 0.5  --   --   --   --   --   --   EOSABS 0.0  --   --   --   --   --   --   BASOSABS 0.0  --   --   --   --   --   --    < > = values in this interval not displayed.    Recent Labs  Lab 01/16/24 1809 01/16/24 1815 01/16/24 1819 01/16/24 2149 01/16/24 2157 01/17/24 0516 01/18/24 0623 01/19/24 0739 01/20/24 0656 01/20/24 1036 01/21/24 0708  NA 138   < >  --   --   --  138 138 141 138  --  139  K 3.5   < >  --   --   --  3.4* 4.1 3.7 3.6  --  3.5  CL 104   < >  --   --   --  109 108 109 104  --  104  CO2 19*  --   --   --   --  22 22 25 25   --  26  ANIONGAP 15  --   --   --   --  7 8 7 9   --  9  GLUCOSE 123*   < >  --   --   --  98 98 99 93  --  86  BUN 7   < >  --   --   --  5* <5* <5* <5*  --  5*  CREATININE 0.84   < >  --   --   --  0.84 0.75 0.94 0.73  --  0.74  AST 23  --   --   --   --   --  18 14* 15  --   --   ALT 11  --   --   --   --   --  10 9 9   --   --  ALKPHOS 29*  --   --   --   --   --  30* 24* 24*  --   --   BILITOT 1.0  --   --   --   --    --  0.5 0.8 0.9  --   --   ALBUMIN  4.1  --   --   --   --   --  3.2* 3.0* 3.0*  --   --   PROCALCITON  --   --   --   --   --  0.62  --   --   --   --   --   LATICACIDVEN  --   --  2.8*  --  1.2  --   --   --   --   --   --   INR  --   --   --  1.1  --   --   --   --   --   --   --   HGBA1C  --   --   --   --   --   --  5.6  --   --   --   --   MG  --   --   --   --   --  1.7 2.2 1.9  --  2.0 1.9  PHOS  --   --   --   --   --   --  2.2* 3.8  --  3.0 2.7  CALCIUM 9.3  --   --   --   --  7.4* 8.2* 8.6* 8.5*  --  8.5*   < > = values in this interval not displayed.      Recent Labs  Lab 01/16/24 1819 01/16/24 2149 01/16/24 2157 01/17/24 0516 01/18/24 0623 01/19/24 0739 01/20/24 0656 01/20/24 1036 01/21/24 0708  PROCALCITON  --   --   --  0.62  --   --   --   --   --   LATICACIDVEN 2.8*  --  1.2  --   --   --   --   --   --   INR  --  1.1  --   --   --   --   --   --   --   HGBA1C  --   --   --   --  5.6  --   --   --   --   MG  --   --   --  1.7 2.2 1.9  --  2.0 1.9  CALCIUM  --   --   --  7.4* 8.2* 8.6* 8.5*  --  8.5*    --------------------------------------------------------------------------------------------------------------- No results found for: CHOL, HDL, LDLCALC, LDLDIRECT, TRIG, CHOLHDL  Lab Results  Component Value Date   HGBA1C 5.6 01/18/2024   No results for input(s): TSH, T4TOTAL, FREET4, T3FREE, THYROIDAB in the last 72 hours. Recent Labs    01/20/24 1036  VITAMINB12 1,046*  FOLATE 16.7  TIBC 314  IRON 18*   ------------------------------------------------------------------------------------------------------------------ Cardiac Enzymes No results for input(s): CKMB, TROPONINI, MYOGLOBIN in the last 168 hours.  Invalid input(s): CK  Micro Results Recent Results (from the past 240 hours)  Culture, blood (routine x 2)     Status: None   Collection Time: 01/16/24  8:03 PM   Specimen: BLOOD  Result Value Ref Range Status    Specimen Description BLOOD SITE NOT SPECIFIED  Final   Special Requests   Final    BOTTLES DRAWN AEROBIC AND ANAEROBIC Blood Culture adequate volume   Culture   Final    NO GROWTH 5 DAYS Performed at [facility], [address] [place], [place] [address]    Report Status 01/21/2024 FINAL  Final  Culture, blood (routine x 2)     Status: None   Collection Time: 01/16/24  8:06 PM   Specimen: BLOOD RIGHT ARM  Result Value Ref Range Status   Specimen Description BLOOD RIGHT ARM  Final   Special Requests   Final    BOTTLES DRAWN AEROBIC AND ANAEROBIC Blood Culture adequate volume   Culture   Final    NO GROWTH 5 DAYS Performed at [facility], [address] [place], [place] [address]    Report Status 01/21/2024 FINAL  Final  Resp panel by RT-PCR (RSV, Flu A&B, Covid) Anterior Nasal Swab     Status: None   Collection Time: 01/16/24  9:49 PM   Specimen: Anterior Nasal Swab  Result Value Ref Range Status   SARS Coronavirus 2 by RT PCR NEGATIVE NEGATIVE Final   Influenza A by PCR NEGATIVE NEGATIVE Final   Influenza B by PCR NEGATIVE NEGATIVE Final    Comment: (NOTE) The Xpert Xpress SARS-CoV-2/FLU/RSV plus assay is intended as an aid in the diagnosis of influenza from Nasopharyngeal swab specimens and should not be used as a sole basis for treatment. Nasal washings and aspirates are unacceptable for Xpert Xpress SARS-CoV-2/FLU/RSV testing.  Fact Sheet for Patients: BloggerCourse.com  Fact Sheet for Healthcare Providers: SeriousBroker.it  This test is not yet approved or cleared by the [place]  FDA and has been authorized for detection and/or diagnosis of SARS-CoV-2 by FDA under an Emergency Use Authorization (EUA). This EUA will remain in effect (meaning this test can be used) for the duration of the COVID-19 declaration under Section 564(b)(1) of the Act, 21 U.S.C. section 360bbb-3(b)(1), unless the  authorization is terminated or revoked.     Resp Syncytial Virus by PCR NEGATIVE NEGATIVE Final    Comment: (NOTE) Fact Sheet for Patients: BloggerCourse.com  Fact Sheet for Healthcare Providers: SeriousBroker.it  This test is not yet approved or cleared by the [place]  FDA and has been authorized for detection and/or diagnosis of SARS-CoV-2 by FDA under an Emergency Use Authorization (EUA). This EUA will remain in effect (meaning this test can be used) for the duration of the COVID-19 declaration under Section 564(b)(1) of the Act, 21 U.S.C. section 360bbb-3(b)(1), unless the authorization is terminated or revoked.  Performed at [facility], [address], [place], [place] [address]   Surgical pcr screen     Status: None   Collection Time: 01/18/24  9:44 AM   Specimen: Nasal Mucosa; Nasal Swab  Result Value Ref Range Status   MRSA, PCR NEGATIVE NEGATIVE Final   Staphylococcus aureus NEGATIVE NEGATIVE Final    Comment: (NOTE) The Xpert SA Assay (FDA approved for NASAL specimens in patients 30 years of age and older), is one component of a comprehensive surveillance program. It is not intended to diagnose infection nor to guide or monitor treatment. Performed at [facility], [address] [place], [place] [address]     Radiology Reports  No results found.    Signature  -   Lynnwood Sauer M.D on 01/21/2024 at 10:50 AM   -  To page go to [email or web address]

## 2024-01-21 NOTE — Evaluation (Signed)
 Physical Therapy Evaluation Patient Details Name: Sherry Abbott MRN: [id number] DOB: [birth year] Today's Date: 01/21/2024  History of Present Illness  48 year old who was recently diagnosed with cecal adenocarcinoma presented 6/9 with abdominal pain. S/p lap-assisted right hemicolectomy 6/12. PMH: Hidradenitis suppurativa, asthma, RA.  Clinical Impression  Pt admitted with above diagnosis. S/p Rt hemicolectomy 6/12. Required CGA for bed mobility and transfer from low bed setting. Ambulates at close supervision level with and without assistive device up to 60 continuous feet today. Educated on importance of regular mobility out of bed during admission. Reviewed LE exercises. Pt currently with functional limitations due to the deficits listed below (see PT Problem List). Pt will benefit from acute skilled PT to increase their independence and safety with mobility to allow discharge.           If plan is discharge home, recommend the following: A little help with walking and/or transfers;A little help with bathing/dressing/bathroom;Assist for transportation;Help with stairs or ramp for entrance;Assistance with cooking/housework   Can travel by private vehicle        Equipment Recommendations None recommended by PT  Recommendations for Other Services       Functional Status Assessment Patient has had a recent decline in their functional status and demonstrates the ability to make significant improvements in function in a reasonable and predictable amount of time.     Precautions / Restrictions Precautions Precautions: Fall Recall of Precautions/Restrictions: Intact Restrictions Weight Bearing Restrictions Per Provider Order: No      Mobility  Bed Mobility Overal bed mobility: Needs Assistance Bed Mobility: Rolling, Sidelying to Sit Rolling: Contact guard assist, Used rails Sidelying to sit: Contact guard assist, Used rails, HOB elevated       General bed mobility  comments: Educated on rolling for comfort. CGA for safety, and use of rail to rise. Cues throughout.    Transfers Overall transfer level: Needs assistance Equipment used: Rolling walker (2 wheels) Transfers: Sit to/from Stand Sit to Stand: Supervision, Modified independent (Device/Increase time)           General transfer comment: CGA for safety to rise from low bed setting, cues for hand placement and technique. RW to steady upon rising. Mod I from toilet with use of rail. Practiced a couple of times from bed.    Ambulation/Gait Ambulation/Gait assistance: Supervision Gait Distance (Feet): 60 Feet (+15) Assistive device: Rolling walker (2 wheels), None Gait Pattern/deviations: Step-through pattern, Decreased stride length Gait velocity: dec Gait velocity interpretation: <1.31 ft/sec, indicative of household ambulator   General Gait Details: Slow and guarded. Initially with use of RW for support, and progressed away from RW. Small steps without evidence of buckling or overt LOB. VSS. Supervision for safety throughout.  Stairs            Wheelchair Mobility     Tilt Bed    Modified Rankin (Stroke Patients Only)       Balance Overall balance assessment: Mild deficits observed, not formally tested                                           Pertinent Vitals/Pain Pain Assessment Pain Assessment: Faces Faces Pain Scale: Hurts even more Pain Location: abdomen Pain Descriptors / Indicators: Aching Pain Intervention(s): Monitored during session, Repositioned    Home Living Family/patient expects to be discharged to:: Private residence Living Arrangements: Spouse/significant other Available Help at  Discharge: Family;Available 24 hours/day Type of Home: House Home Access: Stairs to enter Entrance Stairs-Rails: Right Entrance Stairs-Number of Steps: 5 Alternate Level Stairs-Number of Steps: 13 Home Layout: Two level;Bed/bath upstairs Home  Equipment: Crutches      Prior Function Prior Level of Function : Independent/Modified Independent;Working/employed;Driving             Mobility Comments: ind ADLs Comments: works, Paramedic job.     Extremity/Trunk Assessment   Upper Extremity Assessment Upper Extremity Assessment: Defer to OT evaluation    Lower Extremity Assessment Lower Extremity Assessment: Generalized weakness (guarded, likely due to pain)       Communication   Communication Communication: No apparent difficulties    Cognition Arousal: Alert Behavior During Therapy: WFL for tasks assessed/performed   PT - Cognitive impairments: No apparent impairments                         Following commands: Intact       Cueing Cueing Techniques: Verbal cues     General Comments      Exercises General Exercises - Lower Extremity Ankle Circles/Pumps: AROM, Both, 10 reps, Seated Gluteal Sets: Strengthening, Both, 10 reps, Seated Long Arc Quad: Strengthening, Both, 10 reps, Seated   Assessment/Plan    PT Assessment Patient needs continued PT services  PT Problem List Decreased strength;Decreased activity tolerance;Decreased balance;Decreased mobility;Pain       PT Treatment Interventions DME instruction;Gait training;Stair training;Functional mobility training;Therapeutic activities;Therapeutic exercise;Balance training;Neuromuscular re-education;Patient/family education    PT Goals (Current goals can be found in the Care Plan section)  Acute Rehab PT Goals Patient Stated Goal: Feel better PT Goal Formulation: With patient Time For Goal Achievement: 02/04/24 Potential to Achieve Goals: Good    Frequency Min 2X/week     Co-evaluation               AM-PAC PT 6 Clicks Mobility  Outcome Measure Help needed turning from your back to your side while in a flat bed without using bedrails?: A Little Help needed moving from lying on your back to sitting on the side of a flat bed  without using bedrails?: A Little Help needed moving to and from a bed to a chair (including a wheelchair)?: A Little Help needed standing up from a chair using your arms (e.g., wheelchair or bedside chair)?: A Little Help needed to walk in hospital room?: A Little Help needed climbing 3-5 steps with a railing? : A Little 6 Click Score: 18    End of Session   Activity Tolerance: Patient tolerated treatment well Patient left: in chair;with call bell/phone within reach;with family/visitor present;with SCD's reapplied   PT Visit Diagnosis: Other abnormalities of gait and mobility (R26.89);Muscle weakness (generalized) (M62.81);Pain Pain - part of body:  (abdomen)    Time: 1610-9604 PT Time Calculation (min) (ACUTE ONLY): 32 min   Charges:   PT Evaluation $PT Eval Low Complexity: 1 Low PT Treatments $Therapeutic Activity: 8-22 mins PT General Charges $$ ACUTE PT VISIT: 1 Visit         Jory Ng, PT, DPT [facility]  Rehabilitation Services Physical Therapist Office: [phone] Website: [email or web address]   Alinda Irani 01/21/2024, 3:47 PM

## 2024-01-21 NOTE — Progress Notes (Signed)
 PT Cancellation Note  Patient Details Name: Keela Rubert MRN: [id number] DOB: April 08, 1976   Cancelled Treatment:    Reason Eval/Treat Not Completed: Patient declined, nauseated, receiving Zofran  from RN now. Requests return later. Will follow-up later today as schedule permits.  Jory Ng, PT, DPT [facility]  Rehabilitation Services Physical Therapist Office: [phone] Website: [email or web address]   Alinda Irani 01/21/2024, 9:15 AM

## 2024-01-21 NOTE — Progress Notes (Signed)
    2 Days Post-Op  Subjective: Had difficulty with pain control overnight. Mild nausea, no vomiting.  Objective: Vital signs in last 24 hours: Temp:  [98 F (36.7 C)-98.7 F (37.1 C)] 98 F (36.7 C) (06/14 0746) Pulse Rate:  [66-87] 72 (06/14 0746) Resp:  [13-16] 15 (06/14 0746) BP: (103-114)/(61-70) 114/69 (06/14 0746) SpO2:  [95 %-98 %] 98 % (06/14 0746) Last BM Date : 01/18/24  Intake/Output from previous day: 06/13 0701 - 06/14 0700 In: 480 [P.O.:480] Out: 1250 [Urine:1250] Intake/Output this shift: No intake/output data recorded.  PE: General: resting comfortably, NAD Neuro: alert and oriented, no focal deficits Resp: normal work of breathing on room air Abdomen: soft, nondistended, appropriate tender to palpation. Incisions clean and dry with no eythema, induration or drainage. Extremities: warm and well-perfused   Lab Results:  Recent Labs    01/20/24 0656 01/21/24 0708  WBC 9.5 5.7  HGB 8.8* 8.9*  HCT 25.8* 26.0*  PLT 192 195   BMET Recent Labs    01/20/24 0656 01/21/24 0708  NA 138 139  K 3.6 3.5  CL 104 104  CO2 25 26  GLUCOSE 93 86  BUN <5* 5*  CREATININE 0.73 0.74  CALCIUM 8.5* 8.5*   PT/INR No results for input(s): LABPROT, INR in the last 72 hours.  CMP     Component Value Date/Time   NA 139 01/21/2024 0708   K 3.5 01/21/2024 0708   CL 104 01/21/2024 0708   CO2 26 01/21/2024 0708   GLUCOSE 86 01/21/2024 0708   BUN 5 (L) 01/21/2024 0708   CREATININE 0.74 01/21/2024 0708   CALCIUM 8.5 (L) 01/21/2024 0708   PROT 6.0 (L) 01/20/2024 0656   ALBUMIN  3.0 (L) 01/20/2024 0656   AST 15 01/20/2024 0656   ALT 9 01/20/2024 0656   ALKPHOS 24 (L) 01/20/2024 0656   BILITOT 0.9 01/20/2024 0656   GFRNONAA >60 01/21/2024 0708   GFRAA  06/12/2008 2052    >60        The eGFR has been calculated using the MDRD equation. This calculation has not been validated in all clinical   Lipase     Component Value Date/Time   LIPASE 23  01/16/2024 1809       Assessment/Plan Cecal adenocarcinoma   Admitted with RLQ abdominal pain - S/p lap-assisted right hemicolectomy 6/12 - Continue full liquids today - Added toradol  and scheduled robaxin  for pain. Continue scheduled tylenol , prn oxycodone  and dilaudid . - ID: no postop antibiotics indicated - Mobilize    I reviewed nursing notes, ED provider notes, last 24 h vitals and pain scores, last 48 h intake and output, last 24 h labs and trends, and last 24 h imaging results.  This care required moderate level of medical decision making.    LOS: 4 days    Melvenia Stabs, MD [facility] Surgery 01/21/2024, 10:46 AM Please see Amion for pager number during day hours 7:00am-4:30pm

## 2024-01-21 NOTE — Plan of Care (Signed)

## 2024-01-22 DIAGNOSIS — R1031 Right lower quadrant pain: Secondary | ICD-10-CM | POA: Diagnosis not present

## 2024-01-22 LAB — OCCULT BLOOD X 1 CARD TO LAB, STOOL: Fecal Occult Bld: POSITIVE — AB

## 2024-01-22 NOTE — Progress Notes (Signed)
    3 Days Post-Op  Subjective: No n/v. Has had 3 Bms today - some darker colored. +Flatus. Ambulating multiple times per day. Pain controlled. Tolerating liquids without trouble.   Objective: Vital signs in last 24 hours: Temp:  [98 F (36.7 C)-98.9 F (37.2 C)] 98.3 F (36.8 C) (06/15 0413) Pulse Rate:  [67-87] 87 (06/15 0413) Resp:  [12-20] 12 (06/15 0413) BP: (109-129)/(68-82) 109/68 (06/15 0809) SpO2:  [97 %-100 %] 97 % (06/15 0413) Weight:  [72.2 kg] 72.2 kg (06/15 0440) Last BM Date : 01/18/24  Intake/Output from previous day: 06/14 0701 - 06/15 0700 In: 252.7 [I.V.:252.7] Out: -  Intake/Output this shift: No intake/output data recorded.  PE: General: resting comfortably, NAD Neuro: alert and oriented, no focal deficits Resp: normal work of breathing on room air Abdomen: soft, nondistended, appropriate tender to palpation. Incisions clean and dry with no eythema, induration or drainage. Extremities: warm and well-perfused   Lab Results:  Recent Labs    01/20/24 0656 01/21/24 0708  WBC 9.5 5.7  HGB 8.8* 8.9*  HCT 25.8* 26.0*  PLT 192 195   BMET Recent Labs    01/20/24 0656 01/21/24 0708  NA 138 139  K 3.6 3.5  CL 104 104  CO2 25 26  GLUCOSE 93 86  BUN <5* 5*  CREATININE 0.73 0.74  CALCIUM 8.5* 8.5*   PT/INR No results for input(s): LABPROT, INR in the last 72 hours.  CMP     Component Value Date/Time   NA 139 01/21/2024 0708   K 3.5 01/21/2024 0708   CL 104 01/21/2024 0708   CO2 26 01/21/2024 0708   GLUCOSE 86 01/21/2024 0708   BUN 5 (L) 01/21/2024 0708   CREATININE 0.74 01/21/2024 0708   CALCIUM 8.5 (L) 01/21/2024 0708   PROT 6.0 (L) 01/20/2024 0656   ALBUMIN  3.0 (L) 01/20/2024 0656   AST 15 01/20/2024 0656   ALT 9 01/20/2024 0656   ALKPHOS 24 (L) 01/20/2024 0656   BILITOT 0.9 01/20/2024 0656   GFRNONAA >60 01/21/2024 0708   GFRAA  06/12/2008 2052    >60        The eGFR has been calculated using the MDRD equation. This  calculation has not been validated in all clinical   Lipase     Component Value Date/Time   LIPASE 23 01/16/2024 1809       Assessment/Plan Cecal adenocarcinoma   Admitted with RLQ abdominal pain - S/p lap-assisted right hemicolectomy 6/12 - Advanced to soft diet - FOBT + which is expected in postop setting from a right hemicolectomy. - Cont toradol  and scheduled robaxin  for pain. Continue scheduled tylenol , prn oxycodone  and dilaudid . - ID: no postop antibiotics indicated - Mobilize - If clearly tolerating soft diet today and continues to do well, anticipate possible discharge tomorrow from surgical perspective   LOS: 5 days   Beatris Lincoln, MD [facility]

## 2024-01-22 NOTE — Progress Notes (Signed)
 Dr. Brock Canner was made aware that pt t had her first BM, since surgery, last night, which she described as liquid, black, and small. About 0430 she had a small, mooshy, black stool, which I did view. She had iron yesterday, and she has CBC ordered for this morning. Hemoccult was obtained and sent to lab.

## 2024-01-22 NOTE — Plan of Care (Signed)
  Problem: Education: Goal: Understanding of discharge needs will improve Outcome: Progressing Goal: Verbalization of understanding of the causes of altered bowel function will improve Outcome: Progressing   Problem: Activity: Goal: Ability to tolerate increased activity will improve Outcome: Progressing

## 2024-01-22 NOTE — Progress Notes (Signed)
 PROGRESS NOTE        PATIENT DETAILS Name: Sherry Abbott Age: 48 y.o. 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    Significant events: 6/9>> admit to [facility]  Significant studies: 6/9>> CT abdomen/pelvis: No acute findings 6/9>> transabdominal/transvaginal ultrasound: No acute findings  Significant microbiology data: 6/9>> blood culture: No growth 6/9>> COVID/influenza/RSV PCR: Negative  Procedures: 6/12 >> S/p lap-assisted right hemicolectomy   Consults: General surgery  Subjective:   Patient in bed, appears comfortable, denies any headache, no fever, no chest pain or pressure, no shortness of breath , no abdominal pain, passing flatus had a bowel movement last night. No new focal weakness.   Objective: Vitals: Blood pressure 128/73, pulse 87, temperature 98.3 F (36.8 C), temperature source Oral, resp. rate 12, height 5' 5 (1.651 m), weight 72.2 kg, SpO2 97%.   Exam:  Awake Alert, No new F.N deficits, Normal affect [place].AT,PERRAL Supple Neck, No JVD,   Symmetrical Chest wall movement, Good air movement bilaterally, CTAB RRR,No Gallops, Rubs or new Murmurs,  +ve B.Sounds, Abd Soft, No tenderness,   No Cyanosis, Clubbing or edema    Assessment/Plan: SIRS Probably related to adenocarcinoma of cecum/appendicoliths Sepsis All cultures negative She is now off of antibiotics.  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  Patient presented with RLQ abdominal pain, found to have cecal mass, no appendicitis CT/ultrasound negative for acute abnormalities General surgery consulted, s/p right hemicolectomy done on 6/12. Neurosurgery following now passing flatus having liquid  bowel movements, currently on clear liquids will defer on general surgery to advance diet.  Postdischarge follow-up with oncology.   Surgical pathology pending.  RA Weekly methotrexate-last dose 6/6 Currently on hold until she is recovered from surgery  Asthma Stable not in exacerbation As needed bronchodilators  Anemia due to iron deficiency, transferrin saturation 6% Folate WNL, B12 elevated Started oral iron supplement with vitamin C Follow-up with PCP to repeat iron profile after 3 to 6 months 6/13 Hb 8.8, stable, monitor  Vitamin D insufficiency: started vitamin D 50,000 units p.o. weekly, follow with PCP to repeat vitamin D level after 3 to 6 months.   History of sickle cell trait Supportive care.  Code status:   Code Status: Full Code   DVT Prophylaxis: enoxaparin  (LOVENOX ) injection 40 mg Start: 01/20/24 0800 SCD's Start: 01/19/24 1600 Place TED hose Start: 01/19/24 1600 Place and maintain sequential compression device Start: 01/17/24 1613 SCDs Start: 01/17/24 0457   Family Communication: Spouse at bedside   Disposition Plan: Status is: Inpatient Remains inpatient appropriate because: Severity of illness   Planned Discharge Destination:Home   Diet: Diet Order             Diet full liquid Room service appropriate? Yes; Fluid consistency: Thin  Diet effective now                      Data Review:   Inpatient Medications  Scheduled Meds:  acetaminophen   1,000 mg Oral Q6H   ascorbic acid  500 mg Oral Daily   Chlorhexidine  Gluconate Cloth  6 each Topical Daily   enoxaparin  (LOVENOX ) injection  40 mg Subcutaneous Q24H  feeding supplement  1 Container Oral TID BM   iron polysaccharides  150 mg Oral Daily   ketorolac   15 mg Intravenous Q8H   pantoprazole  (PROTONIX ) IV  40 mg Intravenous Q24H   Vitamin D (Ergocalciferol)  50,000 Units Oral Q7 days   Continuous Infusions:  lactated ringers  50 mL/hr at 01/21/24 1130   PRN Meds:.[DISCONTINUED]  acetaminophen  **OR** acetaminophen , HYDROmorphone  (DILAUDID ) injection, naLOXone  (NARCAN )  injection, ondansetron  (ZOFRAN ) IV, oxyCODONE , prochlorperazine  **OR** prochlorperazine   DVT Prophylaxis  enoxaparin  (LOVENOX ) injection 40 mg Start: 01/20/24 0800 SCD's Start: 01/19/24 1600 Place TED hose Start: 01/19/24 1600 Place and maintain sequential compression device Start: 01/17/24 1613 SCDs Start: 01/17/24 0457       Recent Labs  Lab 01/16/24 2006 01/17/24 0516 01/17/24 2020 01/18/24 0623 01/19/24 0739 01/20/24 0656 01/21/24 0708  WBC 11.0*   < > 6.6 7.6 4.8 9.5 5.7  HGB 10.7*   < > 9.2* 9.7* 9.1* 8.8* 8.9*  HCT 31.4*   < > 27.0* 28.9* 26.9* 25.8* 26.0*  PLT 225   < > 169 190 184 192 195  MCV 86.5   < > 87.7 87.3 86.2 86.3 86.1  MCH 29.5   < > 29.9 29.3 29.2 29.4 29.5  MCHC 34.1   < > 34.1 33.6 33.8 34.1 34.2  RDW 14.8   < > 15.5 15.5 15.2 15.0 15.2  LYMPHSABS 0.4*  --   --   --   --   --   --   MONOABS 0.5  --   --   --   --   --   --   EOSABS 0.0  --   --   --   --   --   --   BASOSABS 0.0  --   --   --   --   --   --    < > = values in this interval not displayed.    Recent Labs  Lab 01/16/24 1809 01/16/24 1815 01/16/24 1819 01/16/24 2149 01/16/24 2157 01/17/24 0516 01/18/24 0623 01/19/24 0739 01/20/24 0656 01/20/24 1036 01/21/24 0708  NA 138   < >  --   --   --  138 138 141 138  --  139  K 3.5   < >  --   --   --  3.4* 4.1 3.7 3.6  --  3.5  CL 104   < >  --   --   --  109 108 109 104  --  104  CO2 19*  --   --   --   --  22 22 25 25   --  26  ANIONGAP 15  --   --   --   --  7 8 7 9   --  9  GLUCOSE 123*   < >  --   --   --  98 98 99 93  --  86  BUN 7   < >  --   --   --  5* <5* <5* <5*  --  5*  CREATININE 0.84   < >  --   --   --  0.84 0.75 0.94 0.73  --  0.74  AST 23  --   --   --   --   --  18 14* 15  --   --   ALT 11  --   --   --   --   --  10 9 9   --   --  ALKPHOS 29*  --   --   --   --   --  30* 24* 24*  --   --   BILITOT 1.0  --   --   --   --    --  0.5 0.8 0.9  --   --   ALBUMIN  4.1  --   --   --   --   --  3.2* 3.0* 3.0*  --   --   PROCALCITON  --   --   --   --   --  0.62  --   --   --   --   --   LATICACIDVEN  --   --  2.8*  --  1.2  --   --   --   --   --   --   INR  --   --   --  1.1  --   --   --   --   --   --   --   HGBA1C  --   --   --   --   --   --  5.6  --   --   --   --   MG  --   --   --   --   --  1.7 2.2 1.9  --  2.0 1.9  PHOS  --   --   --   --   --   --  2.2* 3.8  --  3.0 2.7  CALCIUM 9.3  --   --   --   --  7.4* 8.2* 8.6* 8.5*  --  8.5*   < > = values in this interval not displayed.      Recent Labs  Lab 01/16/24 1819 01/16/24 2149 01/16/24 2157 01/17/24 0516 01/18/24 0623 01/19/24 0739 01/20/24 0656 01/20/24 1036 01/21/24 0708  PROCALCITON  --   --   --  0.62  --   --   --   --   --   LATICACIDVEN 2.8*  --  1.2  --   --   --   --   --   --   INR  --  1.1  --   --   --   --   --   --   --   HGBA1C  --   --   --   --  5.6  --   --   --   --   MG  --   --   --  1.7 2.2 1.9  --  2.0 1.9  CALCIUM  --   --   --  7.4* 8.2* 8.6* 8.5*  --  8.5*    --------------------------------------------------------------------------------------------------------------- No results found for: CHOL, HDL, LDLCALC, LDLDIRECT, TRIG, CHOLHDL  Lab Results  Component Value Date   HGBA1C 5.6 01/18/2024   No results for input(s): TSH, T4TOTAL, FREET4, T3FREE, THYROIDAB in the last 72 hours. Recent Labs    01/20/24 1036  VITAMINB12 1,046*  FOLATE 16.7  TIBC 314  IRON 18*   ------------------------------------------------------------------------------------------------------------------ Cardiac Enzymes No results for input(s): CKMB, TROPONINI, MYOGLOBIN in the last 168 hours.  Invalid input(s): CK  Micro Results Recent Results (from the past 240 hours)  Culture, blood (routine x 2)     Status: None   Collection Time: 01/16/24  8:03 PM   Specimen: BLOOD  Result Value Ref Range Status    Specimen Description BLOOD SITE NOT SPECIFIED  Final   Special Requests   Final    BOTTLES DRAWN AEROBIC AND ANAEROBIC Blood Culture adequate volume   Culture   Final    NO GROWTH 5 DAYS Performed at [facility], [address] [place], [place] [address]    Report Status 01/21/2024 FINAL  Final  Culture, blood (routine x 2)     Status: None   Collection Time: 01/16/24  8:06 PM   Specimen: BLOOD RIGHT ARM  Result Value Ref Range Status   Specimen Description BLOOD RIGHT ARM  Final   Special Requests   Final    BOTTLES DRAWN AEROBIC AND ANAEROBIC Blood Culture adequate volume   Culture   Final    NO GROWTH 5 DAYS Performed at [facility], [address] [place], [place] [address]    Report Status 01/21/2024 FINAL  Final  Resp panel by RT-PCR (RSV, Flu A&B, Covid) Anterior Nasal Swab     Status: None   Collection Time: 01/16/24  9:49 PM   Specimen: Anterior Nasal Swab  Result Value Ref Range Status   SARS Coronavirus 2 by RT PCR NEGATIVE NEGATIVE Final   Influenza A by PCR NEGATIVE NEGATIVE Final   Influenza B by PCR NEGATIVE NEGATIVE Final    Comment: (NOTE) The Xpert Xpress SARS-CoV-2/FLU/RSV plus assay is intended as an aid in the diagnosis of influenza from Nasopharyngeal swab specimens and should not be used as a sole basis for treatment. Nasal washings and aspirates are unacceptable for Xpert Xpress SARS-CoV-2/FLU/RSV testing.  Fact Sheet for Patients: BloggerCourse.com  Fact Sheet for Healthcare Providers: SeriousBroker.it  This test is not yet approved or cleared by the [place]  FDA and has been authorized for detection and/or diagnosis of SARS-CoV-2 by FDA under an Emergency Use Authorization (EUA). This EUA will remain in effect (meaning this test can be used) for the duration of the COVID-19 declaration under Section 564(b)(1) of the Act, 21 U.S.C. section 360bbb-3(b)(1), unless the  authorization is terminated or revoked.     Resp Syncytial Virus by PCR NEGATIVE NEGATIVE Final    Comment: (NOTE) Fact Sheet for Patients: BloggerCourse.com  Fact Sheet for Healthcare Providers: SeriousBroker.it  This test is not yet approved or cleared by the [place]  FDA and has been authorized for detection and/or diagnosis of SARS-CoV-2 by FDA under an Emergency Use Authorization (EUA). This EUA will remain in effect (meaning this test can be used) for the duration of the COVID-19 declaration under Section 564(b)(1) of the Act, 21 U.S.C. section 360bbb-3(b)(1), unless the authorization is terminated or revoked.  Performed at [facility], [address] [place], [place] [address]   Surgical pcr screen     Status: None   Collection Time: 01/18/24  9:44 AM   Specimen: Nasal Mucosa; Nasal Swab  Result Value Ref Range Status   MRSA, PCR NEGATIVE NEGATIVE Final   Staphylococcus aureus NEGATIVE NEGATIVE Final    Comment: (NOTE) The Xpert SA Assay (FDA approved for NASAL specimens in patients 57 years of age and older), is one component of a comprehensive surveillance program. It is not intended to diagnose infection nor to guide or monitor treatment. Performed at [facility], [address] [place], [place] [address]     Radiology Reports  No results found.    Signature  -   Lynnwood Sauer M.D on 01/22/2024 at 8:03 AM   -  To page go to [email or web address]

## 2024-01-22 NOTE — Progress Notes (Signed)
 [facility] night cross cover note:   I was notified by the patient's RN that the patient had her first postoperative bowel movement overnight, with stool noted to be black in color.  Appropriate chart review, it appears that she has cecal adenocarcinoma, and was started on oral iron supplementation yesterday, potentially contributing to the dark coloration of the stool.  No report of any associated hematochezia.  No new symptoms reported at this time.  Most recent vital signs appear stable, including heart rates in the 80s, with systolic blood pressures in the 120s mmHg.  Hemoccult stool study ordered.  CBC ordered for this morning, with result currently pending.   Camelia Cavalier, DO Hospitalist

## 2024-01-23 ENCOUNTER — Other Ambulatory Visit: Payer: Self-pay | Admitting: *Deleted

## 2024-01-23 ENCOUNTER — Inpatient Hospital Stay (HOSPITAL_COMMUNITY)

## 2024-01-23 ENCOUNTER — Encounter: Payer: Self-pay | Admitting: *Deleted

## 2024-01-23 ENCOUNTER — Telehealth: Payer: Self-pay | Admitting: Oncology

## 2024-01-23 ENCOUNTER — Other Ambulatory Visit (HOSPITAL_COMMUNITY): Payer: Self-pay

## 2024-01-23 DIAGNOSIS — C182 Malignant neoplasm of ascending colon: Secondary | ICD-10-CM

## 2024-01-23 DIAGNOSIS — R1031 Right lower quadrant pain: Secondary | ICD-10-CM | POA: Diagnosis not present

## 2024-01-23 LAB — CBC
HCT: 26.2 % — ABNORMAL LOW (ref 36.0–46.0)
Hemoglobin: 9.1 g/dL — ABNORMAL LOW (ref 12.0–15.0)
MCH: 29.5 pg (ref 26.0–34.0)
MCHC: 34.7 g/dL (ref 30.0–36.0)
MCV: 85.1 fL (ref 80.0–100.0)
Platelets: 230 10*3/uL (ref 150–400)
RBC: 3.08 MIL/uL — ABNORMAL LOW (ref 3.87–5.11)
RDW: 15.1 % (ref 11.5–15.5)
WBC: 5.6 10*3/uL (ref 4.0–10.5)
nRBC: 0 % (ref 0.0–0.2)

## 2024-01-23 LAB — BASIC METABOLIC PANEL WITH GFR
Anion gap: 3 — ABNORMAL LOW (ref 5–15)
BUN: <5 mg/dL — ABNORMAL LOW (ref 6–20)
CO2: 28 mmol/L (ref 22–32)
Calcium: 8.3 mg/dL — ABNORMAL LOW (ref 8.9–10.3)
Chloride: 106 mmol/L (ref 98–111)
Creatinine, Ser: 0.69 mg/dL (ref 0.44–1.00)
GFR, Estimated: >60 mL/min (ref >60)
Glucose, Bld: 102 mg/dL — ABNORMAL HIGH (ref 70–99)
Potassium: 3.5 mmol/L (ref 3.5–5.1)
Sodium: 137 mmol/L (ref 135–145)

## 2024-01-23 LAB — PHOSPHORUS: Phosphorus: 3.2 mg/dL (ref 2.5–4.6)

## 2024-01-23 LAB — MAGNESIUM: Magnesium: 1.8 mg/dL (ref 1.7–2.4)

## 2024-01-23 MED ORDER — OXYCODONE HCL 5 MG PO TABS: 5.0000 mg | ORAL_TABLET | ORAL | Status: DC | PRN

## 2024-01-23 MED ORDER — POTASSIUM CHLORIDE CRYS ER 20 MEQ PO TBCR
40.0000 meq | EXTENDED_RELEASE_TABLET | Freq: Once | ORAL | Status: AC
  Filled 2024-01-23: qty 2

## 2024-01-23 MED ORDER — OXYCODONE HCL 5 MG PO TABS
5.0000 mg | ORAL_TABLET | Freq: Three times a day (TID) | ORAL | 0 refills | Status: AC | PRN
Start: 2024-01-23 — End: ?
  Filled 2024-01-23: qty 12, 4d supply, fill #0

## 2024-01-23 MED ORDER — POLYETHYLENE GLYCOL 3350 17 G PO PACK
17.0000 g | PACK | Freq: Two times a day (BID) | ORAL | Status: DC
  Filled 2024-01-23: qty 1

## 2024-01-23 MED ORDER — PANTOPRAZOLE SODIUM 40 MG PO TBEC
40.0000 mg | DELAYED_RELEASE_TABLET | Freq: Every day | ORAL | Status: DC
  Filled 2024-01-23: qty 1

## 2024-01-23 MED ORDER — DOCUSATE SODIUM 100 MG PO CAPS
200.0000 mg | ORAL_CAPSULE | Freq: Two times a day (BID) | ORAL | 0 refills | Status: DC | PRN
  Filled 2024-01-23: qty 25, 6d supply, fill #0

## 2024-01-23 MED ORDER — DOCUSATE SODIUM 100 MG PO CAPS
200.0000 mg | ORAL_CAPSULE | Freq: Two times a day (BID) | ORAL | Status: DC
  Filled 2024-01-23: qty 2

## 2024-01-23 MED ORDER — POLYETHYLENE GLYCOL 3350 17 GM/SCOOP PO POWD
17.0000 g | Freq: Every day | ORAL | 0 refills | Status: AC | PRN
  Filled 2024-01-23: qty 238, 14d supply, fill #0

## 2024-01-23 MED ORDER — MAGNESIUM OXIDE -MG SUPPLEMENT 400 (240 MG) MG PO TABS
800.0000 mg | ORAL_TABLET | Freq: Every day | ORAL | Status: DC
  Filled 2024-01-23: qty 2

## 2024-01-23 MED ORDER — ONDANSETRON HCL 4 MG PO TABS: 4.0000 mg | ORAL_TABLET | Freq: Three times a day (TID) | ORAL | 0 refills | Status: DC | PRN

## 2024-01-23 NOTE — Progress Notes (Signed)
 Physical Therapy Treatment Patient Details Name: Sherry Abbott MRN: [id number] DOB: Aug 07, 1976 Today's Date: 01/23/2024   History of Present Illness 48 year old who was recently diagnosed with cecal adenocarcinoma presented 6/9 with abdominal pain. S/p lap-assisted right hemicolectomy 6/12. PMH: Hidradenitis suppurativa, asthma, RA.    PT Comments  Tolerated treatment well. Improved distance and posture with gait. No assistive device utilized today. No evidence of LOB. Still slower and guarded but progressing nicely. Educated on progression of gait distance, symptom awareness, and LE exercises. Patient will continue to benefit from skilled physical therapy services to further improve independence with functional mobility. Good family support at home. Husband amenable to guard on stairs as needed.    If plan is discharge home, recommend the following: A little help with walking and/or transfers;A little help with bathing/dressing/bathroom;Assist for transportation;Help with stairs or ramp for entrance;Assistance with cooking/housework   Can travel by private vehicle        Equipment Recommendations  None recommended by PT    Recommendations for Other Services       Precautions / Restrictions Precautions Precautions: Fall Recall of Precautions/Restrictions: Intact Restrictions Weight Bearing Restrictions Per Provider Order: No     Mobility  Bed Mobility               General bed mobility comments: in recliner    Transfers Overall transfer level: Modified independent Equipment used: None Transfers: Sit to/from Stand Sit to Stand: Modified independent (Device/Increase time)           General transfer comment: Slow and guarded but with good control and stability upon standing. Performed from reclining chair.    Ambulation/Gait Ambulation/Gait assistance: Supervision Gait Distance (Feet): 125 Feet Assistive device: None Gait Pattern/deviations:  Step-through pattern, Decreased stride length Gait velocity: dec Gait velocity interpretation: <1.8 ft/sec, indicate of risk for recurrent falls   General Gait Details: Slower and slightly guarded with better posture today. No overt LOB or need for RW to stabilize this date. Tolerated increased distance.   Stairs Stairs:  (Declined. Also appears to have adequate strength to navigate steps at home. husband agreeable to guard on stairs as needed. educated on technique verbally.)           Wheelchair Mobility     Tilt Bed    Modified Rankin (Stroke Patients Only)       Balance Overall balance assessment: Mild deficits observed, not formally tested                                          Communication Communication Communication: No apparent difficulties  Cognition Arousal: Alert Behavior During Therapy: WFL for tasks assessed/performed   PT - Cognitive impairments: No apparent impairments                         Following commands: Intact      Cueing Cueing Techniques: Verbal cues  Exercises General Exercises - Lower Extremity Ankle Circles/Pumps: AROM, Both, 10 reps, Seated Gluteal Sets: Strengthening, Both, 10 reps, Seated Long Arc Quad: Strengthening, Both, 10 reps, Seated Hip ABduction/ADduction: Strengthening, Both, 10 reps, Seated    General Comments        Pertinent Vitals/Pain Pain Assessment Pain Assessment: Faces Faces Pain Scale: Hurts even more Pain Location: abdomen Pain Descriptors / Indicators: Aching Pain Intervention(s): Monitored during session, Limited activity within patient's tolerance  Home Living                          Prior Function            PT Goals (current goals can now be found in the care plan section) Acute Rehab PT Goals Patient Stated Goal: Feel better PT Goal Formulation: With patient Time For Goal Achievement: 02/04/24 Potential to Achieve Goals: Good Progress towards  PT goals: Progressing toward goals    Frequency    Min 2X/week      PT Plan      Co-evaluation              AM-PAC PT 6 Clicks Mobility   Outcome Measure  Help needed turning from your back to your side while in a flat bed without using bedrails?: A Little Help needed moving from lying on your back to sitting on the side of a flat bed without using bedrails?: A Little Help needed moving to and from a bed to a chair (including a wheelchair)?: A Little Help needed standing up from a chair using your arms (e.g., wheelchair or bedside chair)?: None Help needed to walk in hospital room?: A Little Help needed climbing 3-5 steps with a railing? : A Little 6 Click Score: 19    End of Session   Activity Tolerance: Patient tolerated treatment well Patient left: in chair;with call bell/phone within reach;with family/visitor present Nurse Communication: Mobility status PT Visit Diagnosis: Other abnormalities of gait and mobility (R26.89);Muscle weakness (generalized) (M62.81);Pain Pain - part of body:  (abdomen)     Time: 2956-2130 PT Time Calculation (min) (ACUTE ONLY): 10 min  Charges:    $Gait Training: 8-22 mins PT General Charges $$ ACUTE PT VISIT: 1 Visit                     Jory Ng, PT, DPT [facility]  Rehabilitation Services Physical Therapist Office: [phone] Website: [email or web address]    Alinda Irani 01/23/2024, 11:41 AM

## 2024-01-23 NOTE — Progress Notes (Signed)
 DISCHARGE NOTE HOME Sherry Abbott to be discharged Home per MD order. Discussed prescriptions and follow up appointments with the patient. Prescriptions given to patient; medication list explained in detail. Patient verbalized understanding.  Skin clean, dry and intact without evidence of skin break down, no evidence of skin tears noted. IV catheter discontinued intact. Site without signs and symptoms of complications. Dressing and pressure applied. Pt denies pain at the site currently. No complaints noted.  See LDA for surgical incisions Patient free of other lines, drains, and wounds.   An After Visit Summary (AVS) was printed and given to the patient. Patient escorted via wheelchair, and discharged home via private auto.  Tonda Francisco, RN

## 2024-01-23 NOTE — Anesthesia Postprocedure Evaluation (Signed)
 Anesthesia Post Note  Patient: Sherry Abbott  Procedure(s) Performed: LAPAROSCOPIC PARTIAL COLECTOMY (Right: Abdomen)     Patient location during evaluation: Nursing Unit Anesthesia Type: General Level of consciousness: awake and alert, patient cooperative and oriented Pain management: pain level controlled Vital Signs Assessment: post-procedure vital signs reviewed and stable Respiratory status: spontaneous breathing, nonlabored ventilation and respiratory function stable Cardiovascular status: stable Postop Assessment: no apparent nausea or vomiting, able to ambulate and adequate PO intake Anesthetic complications: no Comments: Pt for D/C to home today!   No notable events documented.  Last Vitals:  Vitals:   01/22/24 2350 01/23/24 0836  BP: 105/62 99/66  Pulse: 65 87  Resp: 11 16  Temp: 36.8 C 36.5 C  SpO2: 100% 97%    Last Pain:  Vitals:   01/23/24 0703  TempSrc:   PainSc: Asleep                 Tiwanda Threats,E. Isaak Delmundo

## 2024-01-23 NOTE — Discharge Summary (Signed)
 Sherry Abbott ZOX:[id number] DOB: [birth year] DOA: 01/16/2024  PCP: Sun, Vyvyan, MD  Admit date: 01/16/2024  Discharge date: 01/23/2024  Admitted From: Home   Disposition:  Home   Recommendations for Outpatient Follow-up:   Follow up with PCP in 1-2 weeks  PCP Please obtain BMP/CBC, 2 view CXR in 1week,  (see Discharge instructions)   PCP Please follow up on the following pending results:    Home Health: None   Equipment/Devices: None  Consultations: CCS Discharge Condition: Stable    CODE STATUS: Full    Diet Recommendation: Heart Healthy     Chief Complaint  Patient presents with   Emesis   Abdominal Pain     Brief history of present illness from the day of admission and additional interim summary    48 year old who was 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     Significant events: 6/9>> admit to [facility]   Significant studies: 6/9>> CT abdomen/pelvis: No acute findings 6/9>> transabdominal/transvaginal ultrasound: No acute findings   Significant microbiology data: 6/9>> blood culture: No growth 6/9>> COVID/influenza/RSV PCR: Negative   Procedures: 6/12 >> S/p lap-assisted right hemicolectomy                                                                  Hospital Course   SIRS Probably related to adenocarcinoma of cecum/appendicoliths Sepsis All cultures negative She is now off of antibiotics.   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  Patient presented with RLQ abdominal pain, found to have cecal mass, no appendicitis CT/ultrasound negative for acute abnormalities General surgery consulted, s/p right hemicolectomy done on 6/12. She is now passing flatus and having  watery stool, relatively symptom-free, as tolerated soft diet, will advance to regular diet and discharge home, discussed with general surgery.  Postdischarge follow-up with PCP, general surgery and oncology.   Postdischarge follow-up with oncology have communicated with Dr. Scherrie Curt as well.   Surgical pathology pending.   RA Weekly methotrexate-last dose 6/6 Zoom postdischarge   Asthma Stable not in exacerbation As needed bronchodilators   Anemia due to iron deficiency, transferrin saturation 6% Currently stable.  PCP to monitor anemia panel outpatient along with vitamin D levels.   Vitamin D insufficiency: Placed here, request her PCP to monitor and adjust    History of sickle cell trait Supportive care.    Discharge diagnosis     Principal Problem:   Right lower quadrant abdominal pain Active Problems:   Normocytic anemia   Nausea and vomiting   Rheumatoid arthritis (HCC)   Sepsis (HCC)   Colon cancer [facility])    Discharge instructions    Discharge Instructions     Diet - low sodium heart healthy   Complete by: As  directed    Discharge instructions   Complete by: As directed    Follow instructions provided by general surgery.  Follow with Primary MD Sun, Vyvyan, MD in 7 days, kindly follow final pathology results.  Please arrange for outpatient oncology follow-up in 1 to 2 weeks postdischarge.  Get CBC, CMP,  -  checked next visit with your primary MD   Activity: As tolerated with Full fall precautions use walker/cane & assistance as needed  Disposition Home    Diet: Heart Healthy    Special Instructions: If you have smoked or chewed Tobacco  in the last 2 yrs please stop smoking, stop any regular Alcohol  and or any Recreational drug use.  On your next visit with your primary care physician please Get Medicines reviewed and adjusted.  Please request your Prim.MD to go over all Hospital Tests and Procedure/Radiological results at the follow up, please get  all Hospital records sent to your Prim MD by signing hospital release before you go home.  If you experience worsening of your admission symptoms, develop shortness of breath, life threatening emergency, suicidal or homicidal thoughts you must seek medical attention immediately by calling 911 or calling your MD immediately  if symptoms less severe.  You Must read complete instructions/literature along with all the possible adverse reactions/side effects for all the Medicines you take and that have been prescribed to you. Take any new Medicines after you have completely understood and accpet all the possible adverse reactions/side effects.   Do not drive when taking Pain medications.  Do not take more than prescribed Pain, Sleep and Anxiety Medications  Wear Seat belts while driving.   Discharge wound care:   Complete by: As directed    Follow postop instructions given by general surgery.   Increase activity slowly   Complete by: As directed        Discharge Medications   Allergies as of 01/23/2024       Reactions   Betadine [povidone Iodine] Rash   Blueberry [vaccinium Angustifolium] Itching   Milk-related Compounds Anaphylaxis   No milk or other dairy products   Neosporin [neomycin-bacitracin Zn-polymyx] Other (See Comments)   Blistering rash   Shellfish Allergy Anaphylaxis   Sulfa Antibiotics Rash        Medication List     TAKE these medications    docusate sodium 100 MG capsule Commonly known as: COLACE Take 2 capsules (200 mg total) by mouth 2 (two) times daily as needed for mild constipation.   Fluocinolone Acetonide Scalp 0.01 % Oil Apply 1 application  topically once a week. Apply to scalp and neck   fluticasone 50 MCG/ACT nasal spray Commonly known as: FLONASE Place 2 sprays into both nostrils daily as needed.   folic acid 1 MG tablet Commonly known as: FOLVITE Take 1 mg by mouth daily.   methotrexate 2.5 MG tablet Commonly known as: RHEUMATREX Take 15  mg by mouth every Friday. 6 tablets   montelukast 10 MG tablet Commonly known as: SINGULAIR Take 10 mg by mouth at bedtime.   multivitamin with minerals Tabs tablet Take 1 tablet by mouth daily.   ondansetron  4 MG tablet Commonly known as: Zofran  Take 1 tablet (4 mg total) by mouth every 8 (eight) hours as needed for nausea or vomiting.   oxyCODONE  5 MG immediate release tablet Commonly known as: Oxy IR/ROXICODONE  Take 1 tablet (5 mg total) by mouth every 8 (eight) hours as needed for severe pain (pain score 7-10) (5 mg  for pain score 4-6, 10 mg for pain score 7-10).   pantoprazole  20 MG tablet Commonly known as: PROTONIX  Take 20 mg by mouth 2 (two) times daily.   polyethylene glycol 17 g packet Commonly known as: MIRALAX  / GLYCOLAX  Take 17 g by mouth daily as needed.   Vitamin B-Complex Tabs Take 1 tablet by mouth daily at 6 (six) AM.   ZyrTEC Allergy 10 MG tablet Generic drug: cetirizine Take 10 mg by mouth daily.               Discharge Care Instructions  (From admission, onward)           Start     Ordered   01/23/24 0000  Discharge wound care:       Comments: Follow postop instructions given by general surgery.   01/23/24 8295             Follow-up Information     Lujean Sake, MD Follow up on 02/09/2024.   Specialty: General Surgery Why: 02/09/24 at 3:10 pm.,Please bring a copy of your photo ID and insurance card, arrive 30 minutes prior to your appointment. Contact information: [address] [place] [address] [phone]         Sun, Vyvyan, MD. Schedule an appointment as soon as possible for a visit in 1 week(s).   Specialty: Family Medicine Contact information: [address] [place] [address] [phone]         Sumner Ends, MD. Schedule an appointment as soon as possible for a visit in 1 week(s).   Specialty: Oncology Contact information: [address] [place]  [address] [phone]                 Major procedures and Radiology Reports - PLEASE review detailed and final reports thoroughly  -      DG Abd Portable 1V Result Date: 01/23/2024 CLINICAL DATA:  Constipation EXAM: PORTABLE ABDOMEN - 1 VIEW COMPARISON:  None Available. FINDINGS: The bowel gas pattern is normal. No radio-opaque calculi or other significant radiographic abnormality are seen. IMPRESSION: Negative.  No evidence of constipation Electronically Signed   By: Fredrich Jefferson M.D.   On: 01/23/2024 08:33   US  PELVIC COMPLETE W TRANSVAGINAL AND TORSION R/O Result Date: 01/16/2024 CLINICAL DATA:  Pelvic pain EXAM: TRANSABDOMINAL AND TRANSVAGINAL ULTRASOUND OF PELVIS DOPPLER ULTRASOUND OF OVARIES TECHNIQUE: Both transabdominal and transvaginal ultrasound examinations of the pelvis were performed. Transabdominal technique was performed for global imaging of the pelvis including uterus, ovaries, adnexal regions, and pelvic cul-de-sac. It was necessary to proceed with endovaginal exam following the transabdominal exam to visualize the ovaries. Color and duplex Doppler ultrasound was utilized to evaluate blood flow to the ovaries. COMPARISON:  CT from earlier in the same day. FINDINGS: Uterus Measurements: 8.9 x 3.5 x 4.3 cm. = volume: 69 mL. No fibroids or other mass visualized. Endometrium Thickness: 4 mm.  No focal abnormality visualized. Right ovary Measurements: 0.3 x 2.9 x 4.2 cm. = volume: 21 mL. Normal appearance/no adnexal mass. Left ovary Measurements: 2.5 x 1.5 x 2.1 cm. = volume: 4.2 mL. Normal appearance/no adnexal mass. Pulsed Doppler evaluation of both ovaries demonstrates normal low-resistance arterial and venous waveforms. Other findings Trace free fluid is noted within the pelvis likely physiologic in nature. IMPRESSION: No acute abnormality noted. Electronically Signed   By: Violeta Grey M.D.   On: 01/16/2024 23:59   DG Chest Portable 1 View Result Date: 01/16/2024  CLINICAL DATA:   Sepsis EXAM: PORTABLE CHEST 1 VIEW COMPARISON:  CT 06/12/2008 FINDINGS: The heart size and mediastinal contours are within normal limits. Both lungs are clear. The visualized skeletal structures are unremarkable. IMPRESSION: No active disease. Electronically Signed   By: Janeece Mechanic M.D.   On: 01/16/2024 20:58   CT ABDOMEN PELVIS W CONTRAST Result Date: 01/16/2024 CLINICAL DATA:  Abdominal pain, acute, nonlocalized EXAM: CT ABDOMEN AND PELVIS WITH CONTRAST TECHNIQUE: Multidetector CT imaging of the abdomen and pelvis was performed using the standard protocol following bolus administration of intravenous contrast. RADIATION DOSE REDUCTION: This exam was performed according to the departmental dose-optimization program which includes automated exposure control, adjustment of the mA and/or kV according to patient size and/or use of iterative reconstruction technique. CONTRAST:  75mL OMNIPAQUE  IOHEXOL  350 MG/ML SOLN COMPARISON:  None Available. FINDINGS: Lower chest: No acute abnormality Hepatobiliary: 2.8 cm cyst in the left hepatic lobe appears simple. No suspicious focal hepatic abnormality. Gallbladder unremarkable. Pancreas: No focal abnormality or ductal dilatation. Spleen: No focal abnormality.  Normal size. Adrenals/Urinary Tract: No adrenal abnormality. No focal renal abnormality. No stones or hydronephrosis. Urinary bladder is unremarkable. Stomach/Bowel: Multiple appendicoliths within the appendix. No appendiceal dilatation or surrounding inflammation. Stomach, large and small bowel grossly unremarkable. Vascular/Lymphatic: No evidence of aneurysm or adenopathy. Reproductive: Uterus and adnexa unremarkable.  No mass. Other: No free fluid or free air. Rim calcified structure in the right lower quadrant measures 2.5 x 1.3 cm, likely calcified lymph node. Musculoskeletal: No acute bony abnormality. IMPRESSION: No acute findings in the abdomen or pelvis. Electronically Signed   By: Janeece Mechanic M.D.   On:  01/16/2024 20:02    Micro Results    Recent Results (from the past 240 hours)  Culture, blood (routine x 2)     Status: None   Collection Time: 01/16/24  8:03 PM   Specimen: BLOOD  Result Value Ref Range Status   Specimen Description BLOOD SITE NOT SPECIFIED  Final   Special Requests   Final    BOTTLES DRAWN AEROBIC AND ANAEROBIC Blood Culture adequate volume   Culture   Final    NO GROWTH 5 DAYS Performed at [facility], [address], [place], [place] [address]    Report Status 01/21/2024 FINAL  Final  Culture, blood (routine x 2)     Status: None   Collection Time: 01/16/24  8:06 PM   Specimen: BLOOD RIGHT ARM  Result Value Ref Range Status   Specimen Description BLOOD RIGHT ARM  Final   Special Requests   Final    BOTTLES DRAWN AEROBIC AND ANAEROBIC Blood Culture adequate volume   Culture   Final    NO GROWTH 5 DAYS Performed at [facility], [address], [place], [place] [address]    Report Status 01/21/2024 FINAL  Final  Resp panel by RT-PCR (RSV, Flu A&B, Covid) Anterior Nasal Swab     Status: None   Collection Time: 01/16/24  9:49 PM   Specimen: Anterior Nasal Swab  Result Value Ref Range Status   SARS Coronavirus 2 by RT PCR NEGATIVE NEGATIVE Final   Influenza A by PCR NEGATIVE NEGATIVE Final   Influenza B by PCR NEGATIVE NEGATIVE Final    Comment: (NOTE) The Xpert Xpress SARS-CoV-2/FLU/RSV plus assay is intended as an aid in the diagnosis of influenza from Nasopharyngeal swab specimens and should not be used as a sole basis for treatment. Nasal washings and aspirates are unacceptable for  Xpert Xpress SARS-CoV-2/FLU/RSV testing.  Fact Sheet for Patients: BloggerCourse.com  Fact Sheet for Healthcare Providers: SeriousBroker.it  This test is not yet approved or cleared by the [place]  FDA and has been authorized for detection and/or diagnosis of SARS-CoV-2 by FDA under an Emergency Use  Authorization (EUA). This EUA will remain in effect (meaning this test can be used) for the duration of the COVID-19 declaration under Section 564(b)(1) of the Act, 21 U.S.C. section 360bbb-3(b)(1), unless the authorization is terminated or revoked.     Resp Syncytial Virus by PCR NEGATIVE NEGATIVE Final    Comment: (NOTE) Fact Sheet for Patients: BloggerCourse.com  Fact Sheet for Healthcare Providers: SeriousBroker.it  This test is not yet approved or cleared by the [place]  FDA and has been authorized for detection and/or diagnosis of SARS-CoV-2 by FDA under an Emergency Use Authorization (EUA). This EUA will remain in effect (meaning this test can be used) for the duration of the COVID-19 declaration under Section 564(b)(1) of the Act, 21 U.S.C. section 360bbb-3(b)(1), unless the authorization is terminated or revoked.  Performed at [facility], [address], [place], [place] [address]   Surgical pcr screen     Status: None   Collection Time: 01/18/24  9:44 AM   Specimen: Nasal Mucosa; Nasal Swab  Result Value Ref Range Status   MRSA, PCR NEGATIVE NEGATIVE Final   Staphylococcus aureus NEGATIVE NEGATIVE Final    Comment: (NOTE) The Xpert SA Assay (FDA approved for NASAL specimens in patients 33 years of age and older), is one component of a comprehensive surveillance program. It is not intended to diagnose infection nor to guide or monitor treatment. Performed at [facility], [address], [place], [place] [address]     Today   Subjective    Sherry Abbott today has no headache,no chest abdominal pain,no new weakness tingling or numbness, feels much better wants to go home today.    Objective   Blood pressure 105/62, pulse 65, temperature 98.2 F (36.8 C), temperature source Oral, resp. rate 16, height 5' 5 (1.651 m), weight 71.3 kg, SpO2 97%.   Intake/Output Summary (Last 24 hours) at  01/23/2024 0934 Last data filed at 01/22/2024 2050 Gross per 24 hour  Intake 240 ml  Output --  Net 240 ml    Exam  Awake Alert, No new F.N deficits,    [place].AT,PERRAL Supple Neck,   Symmetrical Chest wall movement, Good air movement bilaterally, CTAB RRR,No Gallops,   +ve B.Sounds, Abd Soft, Non tender,  No Cyanosis, Clubbing or edema    Data Review   Recent Labs  Lab 01/16/24 2006 01/17/24 9147 01/18/24 0623 01/19/24 0739 01/20/24 0656 01/21/24 0708 01/23/24 0547  WBC 11.0*   < > 7.6 4.8 9.5 5.7 5.6  HGB 10.7*   < > 9.7* 9.1* 8.8* 8.9* 9.1*  HCT 31.4*   < > 28.9* 26.9* 25.8* 26.0* 26.2*  PLT 225   < > 190 184 192 195 230  MCV 86.5   < > 87.3 86.2 86.3 86.1 85.1  MCH 29.5   < > 29.3 29.2 29.4 29.5 29.5  MCHC 34.1   < > 33.6 33.8 34.1 34.2 34.7  RDW 14.8   < > 15.5 15.2 15.0 15.2 15.1  LYMPHSABS 0.4*  --   --   --   --   --   --   MONOABS 0.5  --   --   --   --   --   --  EOSABS 0.0  --   --   --   --   --   --   BASOSABS 0.0  --   --   --   --   --   --    < > = values in this interval not displayed.    Recent Labs  Lab 01/16/24 1809 01/16/24 1815 01/16/24 1819 01/16/24 2149 01/16/24 2157 01/17/24 0516 01/18/24 6962 01/19/24 0739 01/20/24 0656 01/20/24 1036 01/21/24 0708 01/23/24 0547  NA 138   < >  --   --   --  138 138 141 138  --  139 137  K 3.5   < >  --   --   --  3.4* 4.1 3.7 3.6  --  3.5 3.5  CL 104   < >  --   --   --  109 108 109 104  --  104 106  CO2 19*  --   --   --   --  22 22 25 25   --  26 28  ANIONGAP 15  --   --   --   --  7 8 7 9   --  9 3*  GLUCOSE 123*   < >  --   --   --  98 98 99 93  --  86 102*  BUN 7   < >  --   --   --  5* <5* <5* <5*  --  5* <5*  CREATININE 0.84   < >  --   --   --  0.84 0.75 0.94 0.73  --  0.74 0.69  AST 23  --   --   --   --   --  18 14* 15  --   --   --   ALT 11  --   --   --   --   --  10 9 9   --   --   --   ALKPHOS 29*  --   --   --   --   --  30* 24* 24*  --   --   --   BILITOT 1.0  --   --   --   --    --  0.5 0.8 0.9  --   --   --   ALBUMIN  4.1  --   --   --   --   --  3.2* 3.0* 3.0*  --   --   --   PROCALCITON  --   --   --   --   --  0.62  --   --   --   --   --   --   LATICACIDVEN  --   --  2.8*  --  1.2  --   --   --   --   --   --   --   INR  --   --   --  1.1  --   --   --   --   --   --   --   --   HGBA1C  --   --   --   --   --   --  5.6  --   --   --   --   --   MG  --    < >  --   --   --  1.7 2.2 1.9  --  2.0 1.9  1.8  PHOS  --   --   --   --   --   --  2.2* 3.8  --  3.0 2.7 3.2  CALCIUM 9.3  --   --   --   --  7.4* 8.2* 8.6* 8.5*  --  8.5* 8.3*   < > = values in this interval not displayed.    Total Time in preparing paper work, data evaluation and todays exam - 35 minutes  Signature  -    Lynnwood Sauer M.D on 01/23/2024 at 9:34 AM   -  To page go to [email or web address]

## 2024-01-23 NOTE — Progress Notes (Unsigned)
 Oncology Discharge Planning Note  [facility] [place] Address: [address], [place], [place] [address] Hours of Operation:  [place], Monday - Friday  Clinic Contact Information:  [phone]  Oncology Care Team: Medical Oncologist: Pasam  Patient Details: Name:  Sherry Abbott, Sherry Abbott MRN:   [id number] DOB:   [birth year] Reason for Current Admission: @PPROB @  Discharge Planning Narrative: Notification of admission received by inpatient team for [facility].  Discharge follow-up appointments for oncology are current and available on the AVS and MyChart.   Upon discharge from the hospital, hematology/oncology's post discharge plan of care for the outpatient setting is:   June 18 ,2025 at 1:00pm  [facility] [address] [place], [place] [address]  [phone]   Sherry Abbott will be called within two business days after discharge to review hematology/oncology's plan of care for full understanding.    Outpatient Oncology Specific Care Only: Oncology appointment transportation needs addressed?:  no Oncology medication management for symptom management addressed?:  not applicable Chemo Alert Card reviewed?:  not applicable Immunotherapy Alert Card reviewed?:  not applicable

## 2024-01-23 NOTE — Progress Notes (Signed)
    4 Days Post-Op  Subjective: No n/v. Has had 3 Bms today - some darker colored. +Flatus. Ambulating multiple times per day. Pain controlled. Tolerating liquids without trouble.   Objective: Vital signs in last 24 hours: Temp:  [97.7 F (36.5 C)-99 F (37.2 C)] 97.7 F (36.5 C) (06/16 0836) Pulse Rate:  [65-87] 87 (06/16 0836) Resp:  [11-16] 16 (06/16 0836) BP: (99-109)/(62-73) 99/66 (06/16 0836) SpO2:  [97 %-100 %] 97 % (06/16 0836) Weight:  [71.3 kg] 71.3 kg (06/16 0500) Last BM Date : 01/18/24  Intake/Output from previous day: 06/15 0701 - 06/16 0700 In: 240 [P.O.:240] Out: -  Intake/Output this shift: No intake/output data recorded.  PE: General: resting comfortably, NAD Neuro: alert and oriented, no focal deficits Resp: normal work of breathing on room air Abdomen: soft, nondistended, appropriate tender to palpation. Incisions clean and dry with no eythema, induration or drainage. Extremities: warm and well-perfused   Lab Results:  Recent Labs    01/21/24 0708 01/23/24 0547  WBC 5.7 5.6  HGB 8.9* 9.1*  HCT 26.0* 26.2*  PLT 195 230   BMET Recent Labs    01/21/24 0708 01/23/24 0547  NA 139 137  K 3.5 3.5  CL 104 106  CO2 26 28  GLUCOSE 86 102*  BUN 5* <5*  CREATININE 0.74 0.69  CALCIUM 8.5* 8.3*   PT/INR No results for input(s): LABPROT, INR in the last 72 hours.  CMP     Component Value Date/Time   NA 137 01/23/2024 0547   K 3.5 01/23/2024 0547   CL 106 01/23/2024 0547   CO2 28 01/23/2024 0547   GLUCOSE 102 (H) 01/23/2024 0547   BUN <5 (L) 01/23/2024 0547   CREATININE 0.69 01/23/2024 0547   CALCIUM 8.3 (L) 01/23/2024 0547   PROT 6.0 (L) 01/20/2024 0656   ALBUMIN  3.0 (L) 01/20/2024 0656   AST 15 01/20/2024 0656   ALT 9 01/20/2024 0656   ALKPHOS 24 (L) 01/20/2024 0656   BILITOT 0.9 01/20/2024 0656   GFRNONAA >60 01/23/2024 0547   GFRAA  06/12/2008 2052    >60        The eGFR has been calculated using the MDRD equation. This  calculation has not been validated in all clinical   Lipase     Component Value Date/Time   LIPASE 23 01/16/2024 1809       Assessment/Plan Cecal adenocarcinoma   Admitted with RLQ abdominal pain - S/p lap-assisted right hemicolectomy 6/12 - Advanced to soft diet - FOBT + which is expected in postop setting from a right hemicolectomy. - Cont toradol  and scheduled robaxin  for pain. Continue scheduled tylenol , prn oxycodone  and dilaudid . - ID: no postop antibiotics indicated - Mobilize - If clearly tolerating soft diet today and continues to do well, anticipate possible discharge tomorrow from surgical perspective   LOS: 6 days  Mirta Ammon, [facility] Surgery Please see Amion for pager number during day hours 7:00am-4:30pm

## 2024-01-23 NOTE — Telephone Encounter (Signed)
 Sherry Abbott re-scheduled to see Dr. Randye Buttner on 6/18.

## 2024-01-23 NOTE — TOC Transition Note (Signed)
 Transition of Care [facility]) - Discharge Note   Patient Details  Name: Sherry Abbott MRN: [id number] Date of Birth: 1976/08/08  [facility] of [facility]) CM/SW Contact:  Eusebio High, RN Phone Number: 01/23/2024, 9:55 AM   Clinical Narrative:     Patient will DC to home today. Husband to transport. No TOC needs and patient will follow up as directed on AVS.            Patient Goals and CMS Choice            Discharge Placement                       Discharge Plan and Services Additional resources added to the After Visit Summary for                                       Social Drivers of Health (SDOH) Interventions SDOH Screenings   Food Insecurity: No Food Insecurity (01/17/2024)  Housing: Low Risk  (01/17/2024)  Transportation Needs: No Transportation Needs (01/17/2024)  Utilities: Not At Risk (01/17/2024)  Social Connections: Unknown (01/17/2024)  Tobacco Use: Low Risk  (01/19/2024)     Readmission Risk Interventions     No data to display

## 2024-01-24 LAB — SURGICAL PATHOLOGY

## 2024-01-25 ENCOUNTER — Ambulatory Visit: Admitting: Oncology

## 2024-01-25 ENCOUNTER — Inpatient Hospital Stay: Attending: Oncology | Admitting: Oncology

## 2024-01-25 ENCOUNTER — Other Ambulatory Visit

## 2024-01-25 ENCOUNTER — Inpatient Hospital Stay

## 2024-01-25 VITALS — BP 106/72 | HR 73 | Temp 98.9°F | Resp 18 | Ht 65.0 in | Wt 146.5 lb

## 2024-01-25 DIAGNOSIS — Z8719 Personal history of other diseases of the digestive system: Secondary | ICD-10-CM | POA: Diagnosis not present

## 2024-01-25 DIAGNOSIS — R112 Nausea with vomiting, unspecified: Secondary | ICD-10-CM | POA: Insufficient documentation

## 2024-01-25 DIAGNOSIS — Z800 Family history of malignant neoplasm of digestive organs: Secondary | ICD-10-CM | POA: Diagnosis not present

## 2024-01-25 DIAGNOSIS — M069 Rheumatoid arthritis, unspecified: Secondary | ICD-10-CM | POA: Diagnosis not present

## 2024-01-25 DIAGNOSIS — R918 Other nonspecific abnormal finding of lung field: Secondary | ICD-10-CM | POA: Diagnosis not present

## 2024-01-25 DIAGNOSIS — R1031 Right lower quadrant pain: Secondary | ICD-10-CM | POA: Diagnosis not present

## 2024-01-25 DIAGNOSIS — J45909 Unspecified asthma, uncomplicated: Secondary | ICD-10-CM | POA: Insufficient documentation

## 2024-01-25 DIAGNOSIS — C180 Malignant neoplasm of cecum: Secondary | ICD-10-CM | POA: Insufficient documentation

## 2024-01-25 DIAGNOSIS — G43909 Migraine, unspecified, not intractable, without status migrainosus: Secondary | ICD-10-CM | POA: Diagnosis not present

## 2024-01-25 DIAGNOSIS — Z882 Allergy status to sulfonamides status: Secondary | ICD-10-CM | POA: Diagnosis not present

## 2024-01-25 DIAGNOSIS — Z79899 Other long term (current) drug therapy: Secondary | ICD-10-CM | POA: Insufficient documentation

## 2024-01-25 DIAGNOSIS — Z9049 Acquired absence of other specified parts of digestive tract: Secondary | ICD-10-CM | POA: Insufficient documentation

## 2024-01-25 LAB — CMP (CANCER CENTER ONLY)
ALT: 76 U/L — ABNORMAL HIGH (ref 0–44)
AST: 125 U/L — ABNORMAL HIGH (ref 15–41)
Albumin: 4.4 g/dL (ref 3.5–5.0)
Alkaline Phosphatase: 42 U/L (ref 38–126)
Anion gap: 7 (ref 5–15)
BUN: 7 mg/dL (ref 6–20)
CO2: 30 mmol/L (ref 22–32)
Calcium: 9.7 mg/dL (ref 8.9–10.3)
Chloride: 100 mmol/L (ref 98–111)
Creatinine: 0.6 mg/dL (ref 0.44–1.00)
GFR, Estimated: >60 mL/min (ref >60)
Glucose, Bld: 91 mg/dL (ref 70–99)
Potassium: 3.8 mmol/L (ref 3.5–5.1)
Sodium: 137 mmol/L (ref 135–145)
Total Bilirubin: 0.4 mg/dL (ref 0.0–1.2)
Total Protein: 7.8 g/dL (ref 6.5–8.1)

## 2024-01-25 LAB — IRON AND IRON BINDING CAPACITY (CC-WL,HP ONLY)
Iron: 51 ug/dL (ref 28–170)
Saturation Ratios: 13 % (ref 10.4–31.8)
TIBC: 400 ug/dL (ref 250–450)
UIBC: 349 ug/dL (ref 148–442)

## 2024-01-25 LAB — CBC WITH DIFFERENTIAL (CANCER CENTER ONLY)
Abs Immature Granulocytes: 0.02 10*3/uL (ref 0.00–0.07)
Basophils Absolute: 0 10*3/uL (ref 0.0–0.1)
Basophils Relative: 0 %
Eosinophils Absolute: 0.2 10*3/uL (ref 0.0–0.5)
Eosinophils Relative: 4 %
HCT: 29.3 % — ABNORMAL LOW (ref 36.0–46.0)
Hemoglobin: 10 g/dL — ABNORMAL LOW (ref 12.0–15.0)
Immature Granulocytes: 0 %
Lymphocytes Relative: 20 %
Lymphs Abs: 1 10*3/uL (ref 0.7–4.0)
MCH: 28.7 pg (ref 26.0–34.0)
MCHC: 34.1 g/dL (ref 30.0–36.0)
MCV: 84 fL (ref 80.0–100.0)
Monocytes Absolute: 0.3 10*3/uL (ref 0.1–1.0)
Monocytes Relative: 5 %
Neutro Abs: 3.7 10*3/uL (ref 1.7–7.7)
Neutrophils Relative %: 71 %
Platelet Count: 349 10*3/uL (ref 150–400)
RBC: 3.49 MIL/uL — ABNORMAL LOW (ref 3.87–5.11)
RDW: 15.9 % — ABNORMAL HIGH (ref 11.5–15.5)
WBC Count: 5.2 10*3/uL (ref 4.0–10.5)
nRBC: 0 % (ref 0.0–0.2)

## 2024-01-25 LAB — LACTATE DEHYDROGENASE: LDH: 199 U/L — ABNORMAL HIGH (ref 98–192)

## 2024-01-25 MED ORDER — PROCHLORPERAZINE MALEATE 10 MG PO TABS: 10.0000 mg | ORAL_TABLET | Freq: Four times a day (QID) | ORAL | 1 refills | Status: DC | PRN

## 2024-01-25 MED ORDER — DEXAMETHASONE 4 MG PO TABS: 8.0000 mg | ORAL_TABLET | Freq: Every day | ORAL | 1 refills | Status: DC

## 2024-01-25 MED ORDER — LIDOCAINE-PRILOCAINE 2.5-2.5 % EX CREA
TOPICAL_CREAM | CUTANEOUS | 3 refills | Status: DC
Start: 2024-01-25 — End: 2024-03-23

## 2024-01-25 MED ORDER — ONDANSETRON HCL 8 MG PO TABS: 8.0000 mg | ORAL_TABLET | Freq: Three times a day (TID) | ORAL | 1 refills | Status: DC | PRN

## 2024-01-25 NOTE — Progress Notes (Signed)
START ON PATHWAY REGIMEN - Colorectal     A cycle is every 14 days:     Oxaliplatin      Leucovorin      Fluorouracil      Fluorouracil   **Always confirm dose/schedule in your pharmacy ordering system**  Patient Characteristics: Postoperative without Neoadjuvant Therapy, M0 (Pathologic Staging), Colon, Stage III, Low Risk (pT1-3, pN1) Tumor Location: Colon Therapeutic Status: Postoperative without Neoadjuvant Therapy, M0 (Pathologic Staging) AJCC M Category: cM0 AJCC T Category: pT3 AJCC N Category: pN1a AJCC 8 Stage Grouping: IIIB Intent of Therapy: Curative Intent, Discussed with Patient

## 2024-01-25 NOTE — Progress Notes (Signed)
 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  At least stage III disease.  ASSESSMENT & PLAN:   Sherry Abbott is a 48 y.o. lady with a past medical history of rheumatoid arthritis on methotrexate, hidradenitis suppurativa, asthma, allergic rhinitis, migraines, was referred to our clinic for newly diagnosed invasive adenocarcinoma of the cecum, status post hemicolectomy on 01/19/2024.  pT3, pN1a, Mx, at least stage III B disease.  MMR proficient.  Adenocarcinoma of cecum [facility]) Please review oncology history for additional details and timeline of events.    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   Today I discussed diagnosis, prognosis, plan of care, tentative staging, treatment options.  Reviewed NCCN guidelines.  Request placed for CT of the chest for staging.  Chemotherapy is planned to target residual microscopic disease. FOLFOX regimen, consisting of fluorouracil and oxaliplatin, will be administered biweekly for six months. Potential side effects include nausea, diarrhea, myelosuppression, mucositis, cold sensitivity, and neuropathy. The goal is curative, emphasizing chemotherapy's role in recurrence prevention. Treatment timing will be adjusted to accommodate her work schedule and personal commitments. Chemotherapy dosage may be modified based on side effects and hematologic parameters.    Labs today revealed slightly improved hemoglobin of 10, white count and plate count are normal.  Mild elevation in AST and ALT, likely postoperative.  No evidence of liver disease noted on recent CT of the abdomen and pelvis.  Will monitor this on future visits.  Iron studies within normal limits.  - Arrange chemotherapy education session next week. - Initiate FOLFOX chemotherapy regimen biweekly for six months. - Arrange port placement for chemotherapy administration. - Refer to genetic counseling for genetic testing. - Monitor with circulating tumor DNA test and CT scans every three months for two years. - Schedule colonoscopy one year post-surgery.  I will plan to see her approximately in 3 weeks to begin systemic treatments.  Rheumatoid arthritis (HCC) Rheumatoid arthritis managed with methotrexate. Methotrexate will be discontinued during chemotherapy so as to avoid myelotoxicity.  Also chemotherapy may alleviate RA symptoms. Potential resumption post-chemotherapy will depend on symptomatology. - Discontinue methotrexate during chemotherapy. - Coordinate with rheumatologist Dr. Ebbie Goldmann for RA management.   I reviewed lab results and outside records for this visit and discussed relevant results with the patient. Diagnosis, plan of care and treatment options were also discussed in detail with the patient. Opportunity provided to ask questions and answers provided to her apparent satisfaction. Provided instructions to call our clinic with any problems, questions or concerns prior to return visit. I recommended to continue follow-up with PCP and sub-specialists. She verbalized understanding and agreed with the plan. No barriers to learning was detected.  NCCN guidelines have been consulted in the planning of this patient's care.  Arlo Berber, MD  01/25/2024 4:51 PM  [facility] - A DEPT OF [place] [address] [place] [address]  Dept: [phone] Dept Fax: [phone]   HISTORY OF PRESENTING ILLNESS:   I have reviewed her chart and materials related to her cancer extensively and collaborated history with the patient. Summary of oncologic history is as follows:  ONCOLOGY HISTORY:  Patient had routine screening colonoscopy on 01/12/2024, performed by Dr. Feliberto Hopping.  It showed a fungating, infiltrative, sessile, polypoid and ulcerated nonobstructing large mass in the cecum and in the ascending colon.  The mass was partially circumferential involving two thirds of the lumen circumference, measuring at least 7 cm in length.  Biopsies were obtained.  3 sessile polyps were found in the transverse colon, descending colon and sigmoid colon measuring up to 6 mm, removed.  Pathology from the cecal mass came back positive for invasive moderately differentiated adenocarcinoma.  MMR proficient.  On 01/16/2024, CT abdomen and pelvis showed 2.8 cm cyst in the left hepatic lobe, simple appearing.  No pathologic lymphadenopathy or other acute findings were noted.  She was referred for surgical oncology evaluation.  She was supposed to see Dr. Ramiro Burly on 01/17/2024 but presented to the ED that same day with complaints of worsening right lower quadrant abdominal pain, nausea, vomiting and inability to tolerate oral intake.  She was admitted to the hospital for further evaluation and management.  She was seen by Dr. Leighton Punches in the hospital and she underwent laparoscopic-assisted right hemicolectomy, excision of peritoneal cyst on 01/19/2024.  Final pathology showed invasive moderately differentiated adenocarcinoma with focal mucinous differentiation arising within a tubulovillous adenoma, measuring 6.7 cm in greatest dimension with involvement of submucosa, muscularis propria and into pericolonic adipose tissue.  1 out of 26 lymph nodes were positive for malignancy.  There was another single positive tumor deposit.  All margins  were negative.  Grade 2.  No evidence of LVI or PNI.  MMR proficient.  pT3,pN1a, Mx, at least Stage III B disease.   On her consultation with us  on 01/25/2024, request placed for CT of the chest for staging.  Plan for adjuvant chemotherapy with modified FOLFOX-6, every 2 weeks for up to 12 cycles.   INTERVAL HISTORY:  Discussed the use of AI scribe software for clinical note transcription with the patient, who gave verbal consent to proceed.  History of Present Illness Beonca Dorlis Judice is a 48 year old female with adenocarcinoma of the cecum who presents for follow-up after surgery. She is accompanied by her husband, Gorman Laughter.  She underwent a routine colonoscopy on January 12, 2024, which was her first screening. Prior to the colonoscopy, she experienced severe gastritis following an allergic reaction to shellfish on December 06, 2023. Symptoms included cramping, burning, and pain in the upper GI tract, which persisted despite treatment. She also experienced vomiting and was unable to eat, leading to an emergency department visit. The colonoscopy was performed due to ongoing symptoms and revealed adenocarcinoma of the cecum.  Following the colonoscopy, she experienced worsening gastric pain and vomiting, leading to her admission to the hospital. A CT scan was performed, and she underwent surgery on January 19, 2024, where the tumor was removed. Post-surgery, her bowel movements have been regular, transitioning from liquid to solid foods. She experienced some nausea but no vomiting since the surgery. Initial stools post-surgery were black, and she has not noticed any bright red blood since then. Her pain levels have decreased significantly, with current pain being mild and manageable.  She has a history of rheumatoid arthritis and is currently on methotrexate and iron supplements. She reports a family history of  colon cancer, with her father having been diagnosed at around 29 years old, which had spread to  his liver and bones by the time it was discovered. Her mother's sister also had cancer, though not colon cancer.  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 She wants to manage her work schedule alongside her treatment and be fit for her daughter's college graduation and her son's high school graduation next year.   MEDICAL HISTORY:  Past Medical History:  Diagnosis Date   Allergic rhinitis    Allergic to food    blueberry   Allergic to shellfish    Asthma    Dermatographic urticaria    Hidradenitis suppurativa    Labral tear of right hip joint    surgery   Low back pain    Other chronic allergic conjunctivitis    Polyarthralgia     SURGICAL HISTORY: Past Surgical History:  Procedure Laterality Date   CESAREAN SECTION     LAPAROSCOPIC PARTIAL COLECTOMY Right 01/19/2024   Procedure: LAPAROSCOPIC PARTIAL COLECTOMY;  Surgeon: Lujean Sake, MD;  Location: [facility] OR;  Service: General;  Laterality: Right;  LAPAROSCOPIC ASSISTED RIGHT HEMI COLECTOMY   ROTATOR CUFF REPAIR Right    2015    SOCIAL HISTORY: Social History   Socioeconomic History   Marital status: Single    Spouse name: Not on file   Number of children: Not on file   Years of education: Not on file   Highest education level: Not on file  Occupational History   Not on file  Tobacco Use   Smoking status: Never   Smokeless tobacco: Never  Vaping Use   Vaping status: Never Used  Substance and Sexual Activity   Alcohol use: No   Drug use: No   Sexual activity: Not on file  Other Topics Concern   Not on file  Social History Narrative   Caffiene occasional coffee (herbal tea)   Work : [employer], research lab (study respiratory diseases)   Social Drivers of Corporate investment banker Strain: Not on file  Food Insecurity: No Food Insecurity (01/25/2024)   Hunger Vital Sign    Worried  About Running Out of Food in the Last Year: Never true    Ran Out of Food in the Last Year: Never true  Transportation Needs: No Transportation Needs (01/25/2024)   PRAPARE - Administrator, Civil Service (Medical): No    Lack of Transportation (Non-Medical): No  Physical Activity: Not on file  Stress: Not on file  Social Connections: Unknown (01/17/2024)   Social Connection and Isolation Panel    Frequency of Communication with Friends and Family: Not on file    Frequency of Social Gatherings with Friends and Family: Not on file    Attends Religious Services: Not on file    Active Member of Clubs or Organizations: Not on file    Attends Banker Meetings: Not on file    Marital Status: Married  Intimate Partner Violence: Not At Risk (01/25/2024)   Humiliation, Afraid, Rape, and Kick questionnaire    Fear of Current or Ex-Partner: No    Emotionally Abused: No    Physically Abused: No    Sexually Abused: No    FAMILY HISTORY: Family History  Problem Relation Age of Onset   Migraines Mother  Pulmonary disease Mother    Cancer Father     ALLERGIES:  She is allergic to betadine [povidone iodine], blueberry [vaccinium angustifolium], milk-related compounds, neosporin [neomycin-bacitracin zn-polymyx], shellfish allergy, and sulfa antibiotics.  MEDICATIONS:  Current Outpatient Medications  Medication Sig Dispense Refill   B Complex Vitamins (VITAMIN B-COMPLEX) TABS Take 1 tablet by mouth daily at 6 (six) AM.     cetirizine (ZYRTEC ALLERGY) 10 MG tablet Take 10 mg by mouth daily.     docusate sodium (COLACE) 100 MG capsule Take 2 capsules (200 mg total) by mouth 2 (two) times daily as needed for mild constipation. 25 capsule 0   Fluocinolone Acetonide Scalp 0.01 % OIL Apply 1 application  topically once a week. Apply to scalp and neck     fluticasone (FLONASE) 50 MCG/ACT nasal spray Place 2 sprays into both nostrils daily as needed.     folic acid (FOLVITE) 1  MG tablet Take 1 mg by mouth daily.     methotrexate (RHEUMATREX) 2.5 MG tablet Take 15 mg by mouth every Friday. 6 tablets     mometasone (NASONEX) 50 MCG/ACT nasal spray Place 2 sprays into the nose daily as needed (Allergies).     montelukast (SINGULAIR) 10 MG tablet Take 10 mg by mouth at bedtime.     Multiple Vitamin (MULTIVITAMIN WITH MINERALS) TABS tablet Take 1 tablet by mouth daily.     ondansetron  (ZOFRAN ) 4 MG tablet Take 1 tablet (4 mg total) by mouth every 8 (eight) hours as needed for nausea or vomiting. 10 tablet 0   oxyCODONE  (OXY IR/ROXICODONE ) 5 MG immediate release tablet Take 1 tablet (5 mg total) by mouth every 8 (eight) hours as needed for severe pain (pain score 7-10) (5 mg for pain score 4-6, 10 mg for pain score 7-10). 12 tablet 0   polyethylene glycol powder (GLYCOLAX /MIRALAX ) 17 GM/SCOOP powder Take 17 g by mouth daily as needed. 238 g 0   dexamethasone  (DECADRON ) 4 MG tablet Take 2 tablets (8 mg total) by mouth daily. Start the day after chemotherapy for 2 days. Take with food. 30 tablet 1   lidocaine -prilocaine (EMLA) cream Apply to affected area once 30 g 3   ondansetron  (ZOFRAN ) 8 MG tablet Take 1 tablet (8 mg total) by mouth every 8 (eight) hours as needed for nausea or vomiting. Start on the third day after chemotherapy. 30 tablet 1   pantoprazole  (PROTONIX ) 20 MG tablet Take 20 mg by mouth 2 (two) times daily.     prochlorperazine  (COMPAZINE ) 10 MG tablet Take 1 tablet (10 mg total) by mouth every 6 (six) hours as needed for nausea or vomiting. 30 tablet 1   Current Facility-Administered Medications  Medication Dose Route Frequency Provider Last Rate Last Admin   Fremanezumab -vfrm SOSY 225 mg  225 mg Subcutaneous Once Ahern, Antonia B, MD        REVIEW OF SYSTEMS:    Review of Systems - Oncology  All other pertinent systems were reviewed with the patient and are negative.  PHYSICAL EXAMINATION:    Onc Performance Status - 01/25/24 1332       ECOG Perf  Status   ECOG Perf Status Ambulatory and capable of all selfcare but unable to carry out any work activities.  Up and about more than 50% of waking hours      KPS SCALE   KPS % SCORE Requires occasional assistance but is able to care for most needs  Vitals:   01/25/24 1318  BP: 106/72  Pulse: 73  Resp: 18  Temp: 98.9 F (37.2 C)  SpO2: 100%   Filed Weights   01/25/24 1318  Weight: 146 lb 8 oz (66.5 kg)    Physical Exam Constitutional:      General: She is not in acute distress.    Appearance: Normal appearance.  HENT:     Head: Normocephalic and atraumatic.   Cardiovascular:     Rate and Rhythm: Normal rate.     Heart sounds: Normal heart sounds.  Pulmonary:     Effort: Pulmonary effort is normal. No respiratory distress.     Breath sounds: Normal breath sounds.  Abdominal:     General: There is no distension.     Comments: Scars from recent laparoscopic surgery are healing well.  No signs of infection   Musculoskeletal:     Right lower leg: No edema.     Left lower leg: No edema.   Neurological:     General: No focal deficit present.     Mental Status: She is alert and oriented to person, place, and time.   Psychiatric:        Mood and Affect: Mood normal.        Behavior: Behavior normal.      LABORATORY DATA:   I have reviewed the data as listed.  Results for orders placed or performed in visit on 01/25/24  Lactate dehydrogenase  Result Value Ref Range   LDH 199 (H) 98 - 192 U/L  Iron and Iron Binding Capacity ([facility] only)  Result Value Ref Range   Iron 51 28 - 170 ug/dL   TIBC 161 096 - 045 ug/dL   Saturation Ratios 13 10.4 - 31.8 %   UIBC 349 148 - 442 ug/dL  CMP (Cancer Center only)  Result Value Ref Range   Sodium 137 135 - 145 mmol/L   Potassium 3.8 3.5 - 5.1 mmol/L   Chloride 100 98 - 111 mmol/L   CO2 30 22 - 32 mmol/L   Glucose, Bld 91 70 - 99 mg/dL   BUN 7 6 - 20 mg/dL   Creatinine 4.09 8.11 - 1.00 mg/dL   Calcium  9.7 8.9 - 91.4 mg/dL   Total Protein 7.8 6.5 - 8.1 g/dL   Albumin  4.4 3.5 - 5.0 g/dL   AST 782 (H) 15 - 41 U/L   ALT 76 (H) 0 - 44 U/L   Alkaline Phosphatase 42 38 - 126 U/L   Total Bilirubin 0.4 0.0 - 1.2 mg/dL   GFR, Estimated >95 >62 mL/min   Anion gap 7 5 - 15  CBC with Differential (Cancer Center Only)  Result Value Ref Range   WBC Count 5.2 4.0 - 10.5 K/uL   RBC 3.49 (L) 3.87 - 5.11 MIL/uL   Hemoglobin 10.0 (L) 12.0 - 15.0 g/dL   HCT 13.0 (L) 86.5 - 78.4 %   MCV 84.0 80.0 - 100.0 fL   MCH 28.7 26.0 - 34.0 pg   MCHC 34.1 30.0 - 36.0 g/dL   RDW 69.6 (H) 29.5 - 28.4 %   Platelet Count 349 150 - 400 K/uL   nRBC 0.0 0.0 - 0.2 %   Neutrophils Relative % 71 %   Neutro Abs 3.7 1.7 - 7.7 K/uL   Lymphocytes Relative 20 %   Lymphs Abs 1.0 0.7 - 4.0 K/uL   Monocytes Relative 5 %   Monocytes Absolute 0.3 0.1 - 1.0 K/uL  Eosinophils Relative 4 %   Eosinophils Absolute 0.2 0.0 - 0.5 K/uL   Basophils Relative 0 %   Basophils Absolute 0.0 0.0 - 0.1 K/uL   Immature Granulocytes 0 %   Abs Immature Granulocytes 0.02 0.00 - 0.07 K/uL    RADIOGRAPHIC STUDIES:  I have personally reviewed the radiological images as listed and agree with the findings in the report.  DG Abd Portable 1V Result Date: 01/23/2024 CLINICAL DATA:  Constipation EXAM: PORTABLE ABDOMEN - 1 VIEW COMPARISON:  None Available. FINDINGS: The bowel gas pattern is normal. No radio-opaque calculi or other significant radiographic abnormality are seen. IMPRESSION: Negative.  No evidence of constipation Electronically Signed   By: Fredrich Jefferson M.D.   On: 01/23/2024 08:33   US  PELVIC COMPLETE W TRANSVAGINAL AND TORSION R/O Result Date: 01/16/2024 CLINICAL DATA:  Pelvic pain EXAM: TRANSABDOMINAL AND TRANSVAGINAL ULTRASOUND OF PELVIS DOPPLER ULTRASOUND OF OVARIES TECHNIQUE: Both transabdominal and transvaginal ultrasound examinations of the pelvis were performed. Transabdominal technique was performed for global imaging of the  pelvis including uterus, ovaries, adnexal regions, and pelvic cul-de-sac. It was necessary to proceed with endovaginal exam following the transabdominal exam to visualize the ovaries. Color and duplex Doppler ultrasound was utilized to evaluate blood flow to the ovaries. COMPARISON:  CT from earlier in the same day. FINDINGS: Uterus Measurements: 8.9 x 3.5 x 4.3 cm. = volume: 69 mL. No fibroids or other mass visualized. Endometrium Thickness: 4 mm.  No focal abnormality visualized. Right ovary Measurements: 0.3 x 2.9 x 4.2 cm. = volume: 21 mL. Normal appearance/no adnexal mass. Left ovary Measurements: 2.5 x 1.5 x 2.1 cm. = volume: 4.2 mL. Normal appearance/no adnexal mass. Pulsed Doppler evaluation of both ovaries demonstrates normal low-resistance arterial and venous waveforms. Other findings Trace free fluid is noted within the pelvis likely physiologic in nature. IMPRESSION: No acute abnormality noted. Electronically Signed   By: Violeta Grey M.D.   On: 01/16/2024 23:59   DG Chest Portable 1 View Result Date: 01/16/2024 CLINICAL DATA:  Sepsis EXAM: PORTABLE CHEST 1 VIEW COMPARISON:  CT 06/12/2008 FINDINGS: The heart size and mediastinal contours are within normal limits. Both lungs are clear. The visualized skeletal structures are unremarkable. IMPRESSION: No active disease. Electronically Signed   By: Janeece Mechanic M.D.   On: 01/16/2024 20:58   CT ABDOMEN PELVIS W CONTRAST Result Date: 01/16/2024 CLINICAL DATA:  Abdominal pain, acute, nonlocalized EXAM: CT ABDOMEN AND PELVIS WITH CONTRAST TECHNIQUE: Multidetector CT imaging of the abdomen and pelvis was performed using the standard protocol following bolus administration of intravenous contrast. RADIATION DOSE REDUCTION: This exam was performed according to the departmental dose-optimization program which includes automated exposure control, adjustment of the mA and/or kV according to patient size and/or use of iterative reconstruction technique. CONTRAST:   75mL OMNIPAQUE  IOHEXOL  350 MG/ML SOLN COMPARISON:  None Available. FINDINGS: Lower chest: No acute abnormality Hepatobiliary: 2.8 cm cyst in the left hepatic lobe appears simple. No suspicious focal hepatic abnormality. Gallbladder unremarkable. Pancreas: No focal abnormality or ductal dilatation. Spleen: No focal abnormality.  Normal size. Adrenals/Urinary Tract: No adrenal abnormality. No focal renal abnormality. No stones or hydronephrosis. Urinary bladder is unremarkable. Stomach/Bowel: Multiple appendicoliths within the appendix. No appendiceal dilatation or surrounding inflammation. Stomach, large and small bowel grossly unremarkable. Vascular/Lymphatic: No evidence of aneurysm or adenopathy. Reproductive: Uterus and adnexa unremarkable.  No mass. Other: No free fluid or free air. Rim calcified structure in the right lower quadrant measures 2.5 x 1.3 cm, likely calcified lymph  node. Musculoskeletal: No acute bony abnormality. IMPRESSION: No acute findings in the abdomen or pelvis. Electronically Signed   By: Janeece Mechanic M.D.   On: 01/16/2024 20:02    Orders Placed This Encounter  Procedures   Consent Attestation for Oncology Treatment    The patient is informed of risks, benefits, side-effects of the prescribed oncology treatment. Potential short term and long term side effects and response rates discussed. After a long discussion, the patient made informed decision to proceed.:   Yes   CT Chest W Contrast    Standing Status:   Future    Expected Date:   02/06/2024    Expiration Date:   01/24/2025    If indicated for the ordered procedure, I authorize the administration of contrast media per Radiology protocol:   Yes    Does the patient have a contrast media/X-ray dye allergy?:   No    Is patient pregnant?:   No    Preferred imaging location?:   [facility]   CBC with Differential (Cancer Center Only)    Standing Status:   Future    Number of Occurrences:   1    Expiration Date:    01/24/2025   CMP (Cancer Center only)    Standing Status:   Future    Number of Occurrences:   1    Expiration Date:   01/24/2025   Iron and Iron Binding Capacity ([facility] only)    Standing Status:   Future    Number of Occurrences:   1    Expiration Date:   01/24/2025   Ferritin    Standing Status:   Future    Number of Occurrences:   1    Expiration Date:   01/24/2025   Lactate dehydrogenase    Standing Status:   Future    Number of Occurrences:   1    Expiration Date:   01/24/2025   CEA (Access)    Standing Status:   Future    Number of Occurrences:   1    Expiration Date:   01/24/2025   Pregnancy, urine    Standing Status:   Standing    Number of Occurrences:   10    Next Expected Occurrence:   01/26/2024    Expiration Date:   01/24/2025   CBC with Differential (Cancer Center Only)    Standing Status:   Future    Expected Date:   02/15/2024    Expiration Date:   02/14/2025   CMP (Cancer Center only)    Standing Status:   Future    Expected Date:   02/15/2024    Expiration Date:   02/14/2025   CBC with Differential (Cancer Center Only)    Standing Status:   Future    Expected Date:   02/29/2024    Expiration Date:   02/28/2025   CMP (Cancer Center only)    Standing Status:   Future    Expected Date:   02/29/2024    Expiration Date:   02/28/2025   CBC with Differential (Cancer Center Only)    Standing Status:   Future    Expected Date:   03/14/2024    Expiration Date:   03/14/2025   CMP (Cancer Center only)    Standing Status:   Future    Expected Date:   03/14/2024    Expiration Date:   03/14/2025   [facility] PHYSICIAN COMMUNICATION 1    0 Number of doses of oxaliplatin received at  [facility] or outside facility. AP signature of Provider. If patient has received greater than 5 doses of oxaliplatin, the following pre-medications should be ordered: dexamethasone , diphenhydramine , and formulary histamine H2 antagonist. If patient cannot tolerate oral histamine H2 antagonist, IV may be given.     CODE STATUS:  Code Status History     Date Active Date Inactive Code Status Order ID Comments User Context   01/17/2024 0459 01/23/2024 1727 Full Code [id number]  Juliette Oh, MD ED    Questions for Most Recent Historical Code Status (Order [id number])     Question Answer   By: Consent: discussion documented in EHR            I spent a total of 70 minutes during this encounter with the patient including review of chart and various tests results, discussions about plan of care and coordination of care plan.  This document was completed utilizing speech recognition software. Grammatical errors, random word insertions, pronoun errors, and incomplete sentences are an occasional consequence of this system due to software limitations, ambient noise, and hardware issues. Any formal questions or concerns about the content, text or information contained within the body of this dictation should be directly addressed to the provider for clarification.

## 2024-01-25 NOTE — Assessment & Plan Note (Signed)
 Rheumatoid arthritis managed with methotrexate. Methotrexate will be discontinued during chemotherapy so as to avoid myelotoxicity.  Also chemotherapy may alleviate RA symptoms. Potential resumption post-chemotherapy will depend on symptomatology. - Discontinue methotrexate during chemotherapy. - Coordinate with rheumatologist Dr. Ebbie Goldmann for RA management.

## 2024-01-25 NOTE — Progress Notes (Signed)
 PATIENT NAVIGATOR PROGRESS NOTE  Name: Sherry Abbott Date: 01/25/2024 MRN: [id number]  DOB: [birth year]   Reason for visit:  New Patient Appt  Comments:   Patient seen in office today during her New Patient appt with Dr. Arlo Berber.  Patient was present with her husband. Patient given Journey's binder with information specific to her diagnosis.  SDOH were assessed. No urgent needs were noted. Patient also given my direct contact information and instructed to contact office with any questions or concerns after today.  Patient verbalized understanding.  The following referrals were entered:  Social Work Nutrition Genetics Patient has asked to have port placed by Dr. Leighton Punches, who did her surgery.  Will reach out to Dr. Leighton Punches to see if she would be able to place port-a-cath.   Will follow-up with patient to ensure scan, port placement, and chemo education has been scheduled.    Time spent counseling/coordinating care: > 60 minutes

## 2024-01-25 NOTE — Assessment & Plan Note (Addendum)
 Please review oncology history for additional details and timeline of events.    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   Today I discussed diagnosis, prognosis, plan of care, tentative staging, treatment options.  Reviewed NCCN guidelines.  Request placed for CT of the chest for staging.  Chemotherapy is planned to target residual microscopic disease. FOLFOX regimen, consisting of fluorouracil and oxaliplatin, will be administered biweekly for six months. Potential side effects include nausea, diarrhea, myelosuppression, mucositis, cold sensitivity, and neuropathy. The goal is curative, emphasizing chemotherapy's role in recurrence prevention. Treatment timing will be adjusted to accommodate her work schedule and personal commitments. Chemotherapy dosage may be modified based on side effects and hematologic parameters.   Labs today revealed slightly improved hemoglobin of 10, white count and plate count are normal.  Mild elevation in AST and ALT, likely postoperative.  No evidence of liver disease noted on recent CT of the abdomen and pelvis.  Will monitor this on future visits.  Iron studies within normal limits.  - Arrange chemotherapy education session next week. - Initiate FOLFOX chemotherapy regimen biweekly for six months. - Arrange port placement for chemotherapy administration. - Refer to genetic counseling for genetic testing. - Monitor with circulating tumor DNA test and CT scans every three months for two years. - Schedule colonoscopy one year post-surgery.  I will plan to see her approximately in 3 weeks to begin systemic treatments.

## 2024-01-26 ENCOUNTER — Other Ambulatory Visit: Payer: Self-pay

## 2024-01-26 LAB — CEA (ACCESS): CEA (CHCC): 2.69 ng/mL (ref 0.00–5.00)

## 2024-01-26 LAB — FERRITIN: Ferritin: 99 ng/mL (ref 11–307)

## 2024-01-27 ENCOUNTER — Inpatient Hospital Stay

## 2024-01-27 NOTE — Progress Notes (Signed)
 [facility] Clinical Social Work  Initial Assessment   Sherry Abbott is a 48 y.o. year old female contacted by phone. Clinical Social Work was referred by new patient protocol for assessment of psychosocial needs.   SDOH (Social Determinants of Health) assessments performed: No SDOH Interventions    Flowsheet Row Office Visit from 01/25/2024 in [facility] [place]  SDOH Interventions   Food Insecurity Interventions Intervention Not Indicated  Housing Interventions Intervention Not Indicated  Transportation Interventions Intervention Not Indicated  Utilities Interventions Intervention Not Indicated    SDOH Screenings   Food Insecurity: No Food Insecurity (01/25/2024)  Housing: Unknown (01/25/2024)  Transportation Needs: No Transportation Needs (01/25/2024)  Utilities: Not At Risk (01/25/2024)  Depression (PHQ2-9): Low Risk  (01/25/2024)  Social Connections: Unknown (01/17/2024)  Tobacco Use: Low Risk  (01/19/2024)     Distress Screen completed: No     No data to display            Family/Social Information:  Housing Arrangement: patient lives with spouse and high school aged son. Family members/support persons in your life? Patient's family live out of state. Patient named her husband and her children as her primary support systems. Transportation concerns: Patient will transport herself as long as possible. Patient will need support if transporting becomes challenging.   Employment: Working full time .  Income source: Employment Financial concerns: No Type of concern: None Food access concerns: no Religious or spiritual practice: Yes-Christian. Patient's faith is a coping skill to get through difficult times. Advanced directives: No Services Currently in place:  Insurance, Family, Employment  Coping/ Adjustment to diagnosis: Patient understands treatment plan and what happens next? yes, patient understands she will be  receiving scans, port, then chemotherapy in next 3 weeks. Concerns about diagnosis and/or treatment: Overwhelmed by information Patient reported stressors: Depression and Adjusting to my illness  Patient enjoys crafts Current coping skills/ strengths: Ability for insight , Active sense of humor , Average or above average intelligence , Capable of independent living , Communication skills , Contractor , General fund of knowledge , Motivation for treatment/growth , Physical Health , Religious Affiliation , Special hobby/interest , and Supportive family/friends     SUMMARY: Current SDOH Barriers:  Limited social support  Clinical Social Work Clinical Goal(s):  No clinical social work goals at this time  Interventions: Discussed common feeling and emotions when being diagnosed with cancer, and the importance of support during treatment Informed patient of the support team roles and support services at [facility] Provided CSW contact information and encouraged patient to call with any questions or concerns   Follow Up Plan: Patient will contact CSW with any support or resource needs Patient verbalizes understanding of plan: Yes  Maudie Sorrow, LCSW Clinical Social Worker [facility]

## 2024-01-31 ENCOUNTER — Encounter (HOSPITAL_COMMUNITY): Payer: Self-pay | Admitting: Hematology

## 2024-01-31 ENCOUNTER — Encounter (HOSPITAL_COMMUNITY): Payer: Self-pay

## 2024-01-31 NOTE — Progress Notes (Signed)
 Surgical Instructions   Your procedure is scheduled on Friday, June 27th, 2025. Report to [facility] Main Entrance A at 8:45 A.M., then check in with the Admitting office. Any questions or running late day of surgery: call [phone]  Questions prior to your surgery date: call [phone], Monday-Friday, 8am-4pm. If you experience any cold or flu symptoms such as cough, fever, chills, shortness of breath, etc. between now and your scheduled surgery, please notify us  at the above number.     Remember:  Do not eat after midnight the night before your surgery   You may drink clear liquids until 7:45 the morning of your surgery.   Clear liquids allowed are: Water , Non-Citrus Juices (without pulp), Carbonated Beverages, Clear Tea (no milk, honey, etc.), Black Coffee Only (NO MILK, CREAM OR POWDERED CREAMER of any kind), and Gatorade.    Take these medicines the morning of surgery with A SIP OF WATER : Cetirizine (Zyrtec) Pantoprazole  (Protonix )   May take these medicines IF NEEDED: Docusate Sodium  (Colace) Fluticasone (Flonase) Mometasone (Nasonex) Ondansetron  (Zofran ) Oxycodone  (Oxy IR/Roxicodone )   One week prior to surgery, STOP taking any Aspirin (unless otherwise instructed by your surgeon) Aleve, Naproxen, Ibuprofen , Motrin , Advil , Goody's, BC's, all herbal medications, fish oil, and non-prescription vitamins.                     Do NOT Smoke (Tobacco/Vaping) for 24 hours prior to your procedure.  If you use a CPAP at night, you may bring your mask/headgear for your overnight stay.   You will be asked to remove any contacts, glasses, piercing's, hearing aid's, dentures/partials prior to surgery. Please bring cases for these items if needed.    Patients discharged the day of surgery will not be allowed to drive home, and someone needs to stay with them for 24 hours.  SURGICAL WAITING ROOM VISITATION Patients may have no more than 2 support people in the waiting area -  these visitors may rotate.   Pre-op nurse will coordinate an appropriate time for 1 ADULT support person, who may not rotate, to accompany patient in pre-op.  Children under the age of 5 must have an adult with them who is not the patient and must remain in the main waiting area with an adult.  If the patient needs to stay at the hospital during part of their recovery, the visitor guidelines for inpatient rooms apply.  Please refer to the [facility] website for the visitor guidelines for any additional information.   If you received a COVID test during your pre-op visit  it is requested that you wear a mask when out in public, stay away from anyone that may not be feeling well and notify your surgeon if you develop symptoms. If you have been in contact with anyone that has tested positive in the last 10 days please notify you surgeon.      Pre-operative CHG Bathing Instructions   You can play a key role in reducing the risk of infection after surgery. Your skin needs to be as free of germs as possible. You can reduce the number of germs on your skin by washing with CHG (chlorhexidine  gluconate) soap before surgery. CHG is an antiseptic soap that kills germs and continues to kill germs even after washing.   DO NOT use if you have an allergy to chlorhexidine /CHG or antibacterial soaps. If your skin becomes reddened or irritated, stop using the CHG and notify one of our RNs at [phone].  TAKE A SHOWER THE NIGHT BEFORE SURGERY AND THE DAY OF SURGERY    Please keep in mind the following:  DO NOT shave, including legs and underarms, 48 hours prior to surgery.   You may shave your face before/day of surgery.  Place clean sheets on your bed the night before surgery Use a clean washcloth (not used since being washed) for each shower. DO NOT sleep with pet's night before surgery.  CHG Shower Instructions:  Wash your face and private area with normal soap. If you choose to wash  your hair, wash first with your normal shampoo.  After you use shampoo/soap, rinse your hair and body thoroughly to remove shampoo/soap residue.  Turn the water  OFF and apply half the bottle of CHG soap to a CLEAN washcloth.  Apply CHG soap ONLY FROM YOUR NECK DOWN TO YOUR TOES (washing for 3-5 minutes)  DO NOT use CHG soap on face, private areas, open wounds, or sores.  Pay special attention to the area where your surgery is being performed.  If you are having back surgery, having someone wash your back for you may be helpful. Wait 2 minutes after CHG soap is applied, then you may rinse off the CHG soap.  Pat dry with a clean towel  Put on clean pajamas    Additional instructions for the day of surgery: DO NOT APPLY any lotions, deodorants, cologne, or perfumes.   Do not wear jewelry or makeup Do not wear nail polish, gel polish, artificial nails, or any other type of covering on natural nails (fingers and toes) Do not bring valuables to the hospital. [facility] is not responsible for valuables/personal belongings. Put on clean/comfortable clothes.  Please brush your teeth.  Ask your nurse before applying any prescription medications to the skin.

## 2024-02-01 ENCOUNTER — Other Ambulatory Visit: Payer: Self-pay

## 2024-02-01 ENCOUNTER — Encounter: Payer: Self-pay | Admitting: Oncology

## 2024-02-01 ENCOUNTER — Telehealth: Payer: Self-pay | Admitting: Oncology

## 2024-02-01 ENCOUNTER — Encounter (HOSPITAL_COMMUNITY)
Admission: RE | Admit: 2024-02-01 | Discharge: 2024-02-01 | Disposition: A | Discharge status: Home / Self Care | Source: Ambulatory Visit | Attending: Surgery | Admitting: Surgery

## 2024-02-01 ENCOUNTER — Encounter (HOSPITAL_COMMUNITY): Payer: Self-pay

## 2024-02-01 ENCOUNTER — Ambulatory Visit: Payer: Self-pay | Admitting: Surgery

## 2024-02-01 VITALS — BP 106/67 | HR 63 | Temp 98.4°F | Resp 16 | Ht 65.0 in | Wt 146.3 lb

## 2024-02-01 DIAGNOSIS — Z452 Encounter for adjustment and management of vascular access device: Secondary | ICD-10-CM | POA: Diagnosis not present

## 2024-02-01 DIAGNOSIS — Z01812 Encounter for preprocedural laboratory examination: Secondary | ICD-10-CM | POA: Insufficient documentation

## 2024-02-01 DIAGNOSIS — K219 Gastro-esophageal reflux disease without esophagitis: Secondary | ICD-10-CM | POA: Diagnosis not present

## 2024-02-01 DIAGNOSIS — C189 Malignant neoplasm of colon, unspecified: Secondary | ICD-10-CM | POA: Diagnosis present

## 2024-02-01 DIAGNOSIS — Z9049 Acquired absence of other specified parts of digestive tract: Secondary | ICD-10-CM | POA: Diagnosis not present

## 2024-02-01 DIAGNOSIS — J45909 Unspecified asthma, uncomplicated: Secondary | ICD-10-CM | POA: Diagnosis not present

## 2024-02-01 DIAGNOSIS — Z01818 Encounter for other preprocedural examination: Secondary | ICD-10-CM

## 2024-02-01 HISTORY — DX: Malignant (primary) neoplasm, unspecified: C80.1

## 2024-02-01 LAB — COMPREHENSIVE METABOLIC PANEL WITH GFR
ALT: 35 U/L (ref 0–44)
AST: 26 U/L (ref 15–41)
Albumin: 3.6 g/dL (ref 3.5–5.0)
Alkaline Phosphatase: 36 U/L — ABNORMAL LOW (ref 38–126)
Anion gap: 4 — ABNORMAL LOW (ref 5–15)
BUN: 9 mg/dL (ref 6–20)
CO2: 27 mmol/L (ref 22–32)
Calcium: 8.9 mg/dL (ref 8.9–10.3)
Chloride: 103 mmol/L (ref 98–111)
Creatinine, Ser: 0.71 mg/dL (ref 0.44–1.00)
GFR, Estimated: >60 mL/min (ref >60)
Glucose, Bld: 76 mg/dL (ref 70–99)
Potassium: 4.1 mmol/L (ref 3.5–5.1)
Sodium: 134 mmol/L — ABNORMAL LOW (ref 135–145)
Total Bilirubin: 0.3 mg/dL (ref 0.0–1.2)
Total Protein: 7.3 g/dL (ref 6.5–8.1)

## 2024-02-01 NOTE — Telephone Encounter (Signed)
 Patient has been scheduled. Aware of appt date and time.    Pasam Patient: Schedule appts at [facility] Received: Today Heller, Evalene CROME, RN  [place], Damaris; [place]berg, Amber N Good morning,  Please schedule lab and chemo education the week of June 30th.  Please schedule patient for follow-up visit with Dr. Barba day of chemo on July 9th.  All appointments should be scheduled at the [facility].  Thank you, Tully

## 2024-02-01 NOTE — Progress Notes (Signed)
 PCP - Vyvyan Sun,MD Cardiologist - denies  PPM/ICD - denies Device Orders -  Rep Notified -   Chest x-ray -01/16/24-1 view EKG - 01/17/24  Stress Test - denies ECHO - denies Cardiac Cath - denies  Sleep Study - denies CPAP -   Fasting Blood Sugar - na Checks Blood Sugar _____ times a day  Last dose of GLP1 agonist-  na GLP1 instructions:   Blood Thinner Instructions:na Aspirin Instructions:na  ERAS Protcol - clears until 0745 PRE-SURGERY Ensure or G2- no  COVID TEST- na   Anesthesia review: yes -abnormal labs  Patient denies shortness of breath, fever, cough and chest pain at PAT appointment   All instructions explained to the patient, with a verbal understanding of the material. Patient agrees to go over the instructions while at home for a better understanding. The opportunity to ask questions was provided.

## 2024-02-01 NOTE — Progress Notes (Signed)
 The proposed treatment discussed in conference is for discussion purpose only and is not a binding recommendation.  The patients have not been physically examined, or presented with their treatment options.  Therefore, final treatment plans cannot be decided.

## 2024-02-02 ENCOUNTER — Other Ambulatory Visit: Payer: Self-pay

## 2024-02-03 ENCOUNTER — Ambulatory Visit (HOSPITAL_COMMUNITY)

## 2024-02-03 ENCOUNTER — Encounter: Payer: Self-pay | Admitting: Oncology

## 2024-02-03 ENCOUNTER — Encounter (HOSPITAL_COMMUNITY)
Admission: RE | Disposition: A | Discharge status: Home / Self Care | Payer: Self-pay | Source: Home / Self Care | Attending: Surgery

## 2024-02-03 ENCOUNTER — Ambulatory Visit (HOSPITAL_COMMUNITY)
Admission: RE | Admit: 2024-02-03 | Discharge: 2024-02-03 | Disposition: A | Discharge status: Home / Self Care | Attending: Surgery | Admitting: Surgery

## 2024-02-03 ENCOUNTER — Other Ambulatory Visit: Payer: Self-pay

## 2024-02-03 ENCOUNTER — Ambulatory Visit (HOSPITAL_COMMUNITY): Admitting: Anesthesiology

## 2024-02-03 ENCOUNTER — Encounter (HOSPITAL_COMMUNITY): Payer: Self-pay | Admitting: Surgery

## 2024-02-03 DIAGNOSIS — Z452 Encounter for adjustment and management of vascular access device: Secondary | ICD-10-CM | POA: Insufficient documentation

## 2024-02-03 DIAGNOSIS — Z9049 Acquired absence of other specified parts of digestive tract: Secondary | ICD-10-CM | POA: Insufficient documentation

## 2024-02-03 DIAGNOSIS — C189 Malignant neoplasm of colon, unspecified: Secondary | ICD-10-CM | POA: Insufficient documentation

## 2024-02-03 DIAGNOSIS — C180 Malignant neoplasm of cecum: Secondary | ICD-10-CM

## 2024-02-03 DIAGNOSIS — J45909 Unspecified asthma, uncomplicated: Secondary | ICD-10-CM | POA: Insufficient documentation

## 2024-02-03 DIAGNOSIS — K219 Gastro-esophageal reflux disease without esophagitis: Secondary | ICD-10-CM | POA: Insufficient documentation

## 2024-02-03 HISTORY — PX: PORTACATH PLACEMENT: SHX2246

## 2024-02-03 LAB — POCT PREGNANCY, URINE: Preg Test, Ur: NEGATIVE

## 2024-02-03 SURGERY — INSERTION, TUNNELED CENTRAL VENOUS DEVICE, WITH PORT
Anesthesia: General | Site: Chest

## 2024-02-03 MED ORDER — DEXAMETHASONE SODIUM PHOSPHATE 10 MG/ML IJ SOLN
INTRAMUSCULAR | Status: AC
  Filled 2024-02-03: qty 1

## 2024-02-03 MED ORDER — PHENYLEPHRINE 80 MCG/ML (10ML) SYRINGE FOR IV PUSH (FOR BLOOD PRESSURE SUPPORT)
PREFILLED_SYRINGE | INTRAVENOUS | Status: DC | PRN
  Administered 2024-02-03: 160 ug via INTRAVENOUS
  Administered 2024-02-03: 80 ug via INTRAVENOUS
  Administered 2024-02-03: 160 ug via INTRAVENOUS
  Administered 2024-02-03: 80 ug via INTRAVENOUS

## 2024-02-03 MED ORDER — CEFAZOLIN SODIUM-DEXTROSE 2-4 GM/100ML-% IV SOLN
2.0000 g | INTRAVENOUS | Status: AC
  Administered 2024-02-03: 2 g via INTRAVENOUS
  Filled 2024-02-03: qty 100

## 2024-02-03 MED ORDER — ROCURONIUM BROMIDE 10 MG/ML (PF) SYRINGE
PREFILLED_SYRINGE | INTRAVENOUS | Status: AC
  Filled 2024-02-03: qty 10

## 2024-02-03 MED ORDER — PROPOFOL 10 MG/ML IV BOLUS
INTRAVENOUS | Status: AC
  Filled 2024-02-03: qty 20

## 2024-02-03 MED ORDER — PROPOFOL 10 MG/ML IV BOLUS
INTRAVENOUS | Status: DC | PRN
  Administered 2024-02-03: 150 mg via INTRAVENOUS

## 2024-02-03 MED ORDER — FENTANYL CITRATE (PF) 100 MCG/2ML IJ SOLN: 25.0000 ug | INTRAMUSCULAR | Status: DC | PRN

## 2024-02-03 MED ORDER — BUPIVACAINE-EPINEPHRINE 0.25% -1:200000 IJ SOLN
INTRAMUSCULAR | Status: DC | PRN
  Administered 2024-02-03: 10 mL

## 2024-02-03 MED ORDER — HEPARIN SOD (PORK) LOCK FLUSH 100 UNIT/ML IV SOLN
INTRAVENOUS | Status: DC | PRN
  Administered 2024-02-03: 500 [IU]

## 2024-02-03 MED ORDER — LIDOCAINE 2% (20 MG/ML) 5 ML SYRINGE
INTRAMUSCULAR | Status: AC
  Filled 2024-02-03: qty 5

## 2024-02-03 MED ORDER — ROCURONIUM BROMIDE 10 MG/ML (PF) SYRINGE
PREFILLED_SYRINGE | INTRAVENOUS | Status: DC | PRN
  Administered 2024-02-03: 50 mg via INTRAVENOUS

## 2024-02-03 MED ORDER — HEPARIN 6000 UNIT IRRIGATION SOLUTION
Status: AC
  Filled 2024-02-03: qty 500

## 2024-02-03 MED ORDER — OXYCODONE HCL 5 MG/5ML PO SOLN: 5.0000 mg | Freq: Once | ORAL | Status: AC | PRN

## 2024-02-03 MED ORDER — ACETAMINOPHEN 10 MG/ML IV SOLN: 1000.0000 mg | Freq: Once | INTRAVENOUS | Status: DC | PRN

## 2024-02-03 MED ORDER — SUGAMMADEX SODIUM 200 MG/2ML IV SOLN
INTRAVENOUS | Status: DC | PRN
  Administered 2024-02-03: 200 mg via INTRAVENOUS

## 2024-02-03 MED ORDER — ORAL CARE MOUTH RINSE: 15.0000 mL | Freq: Once | OROMUCOSAL | Status: AC

## 2024-02-03 MED ORDER — DEXAMETHASONE SODIUM PHOSPHATE 10 MG/ML IJ SOLN
INTRAMUSCULAR | Status: DC | PRN
  Administered 2024-02-03: 10 mg via INTRAVENOUS

## 2024-02-03 MED ORDER — OXYCODONE HCL 5 MG PO TABS
5.0000 mg | ORAL_TABLET | Freq: Once | ORAL | Status: AC | PRN
  Administered 2024-02-03: 5 mg via ORAL

## 2024-02-03 MED ORDER — ONDANSETRON HCL 4 MG/2ML IJ SOLN
INTRAMUSCULAR | Status: DC | PRN
  Administered 2024-02-03: 4 mg via INTRAVENOUS

## 2024-02-03 MED ORDER — FENTANYL CITRATE (PF) 250 MCG/5ML IJ SOLN
INTRAMUSCULAR | Status: AC
  Filled 2024-02-03: qty 5

## 2024-02-03 MED ORDER — OXYCODONE HCL 5 MG PO TABS
ORAL_TABLET | ORAL | Status: AC
  Filled 2024-02-03: qty 1

## 2024-02-03 MED ORDER — MIDAZOLAM HCL 2 MG/2ML IJ SOLN
INTRAMUSCULAR | Status: DC | PRN
  Administered 2024-02-03 (×2): 1 mg via INTRAVENOUS

## 2024-02-03 MED ORDER — CHLORHEXIDINE GLUCONATE 0.12 % MT SOLN
15.0000 mL | Freq: Once | OROMUCOSAL | Status: AC
  Administered 2024-02-03: 15 mL via OROMUCOSAL
  Filled 2024-02-03: qty 15

## 2024-02-03 MED ORDER — HEPARIN SOD (PORK) LOCK FLUSH 100 UNIT/ML IV SOLN
INTRAVENOUS | Status: AC
  Filled 2024-02-03: qty 5

## 2024-02-03 MED ORDER — FENTANYL CITRATE (PF) 250 MCG/5ML IJ SOLN
INTRAMUSCULAR | Status: DC | PRN
  Administered 2024-02-03: 50 ug via INTRAVENOUS

## 2024-02-03 MED ORDER — LIDOCAINE 2% (20 MG/ML) 5 ML SYRINGE
INTRAMUSCULAR | Status: DC | PRN
  Administered 2024-02-03: 60 mg via INTRAVENOUS

## 2024-02-03 MED ORDER — EPHEDRINE SULFATE-NACL 50-0.9 MG/10ML-% IV SOSY
PREFILLED_SYRINGE | INTRAVENOUS | Status: DC | PRN
  Administered 2024-02-03 (×3): 5 mg via INTRAVENOUS

## 2024-02-03 MED ORDER — 0.9 % SODIUM CHLORIDE (POUR BTL) OPTIME
TOPICAL | Status: DC | PRN
  Administered 2024-02-03: 1000 mL

## 2024-02-03 MED ORDER — HEPARIN 6000 UNIT IRRIGATION SOLUTION
Status: DC | PRN
  Administered 2024-02-03: 1

## 2024-02-03 MED ORDER — ONDANSETRON HCL 4 MG/2ML IJ SOLN
INTRAMUSCULAR | Status: AC
  Filled 2024-02-03: qty 2

## 2024-02-03 MED ORDER — MIDAZOLAM HCL 2 MG/2ML IJ SOLN
INTRAMUSCULAR | Status: AC
  Filled 2024-02-03: qty 2

## 2024-02-03 MED ORDER — LACTATED RINGERS IV SOLN: INTRAVENOUS | Status: DC

## 2024-02-03 SURGICAL SUPPLY — 33 items
BAG COUNTER SPONGE SURGICOUNT (BAG) ×1 IMPLANT
BAG DECANTER FOR FLEXI CONT (MISCELLANEOUS) ×1 IMPLANT
CHLORAPREP W/TINT 26 (MISCELLANEOUS) ×1 IMPLANT
COVER SURGICAL LIGHT HANDLE (MISCELLANEOUS) ×1 IMPLANT
COVER TRANSDUCER ULTRASND GEL (DISPOSABLE) IMPLANT
DERMABOND ADVANCED .7 DNX12 (GAUZE/BANDAGES/DRESSINGS) ×1 IMPLANT
DRAPE C-ARM 42X120 X-RAY (DRAPES) ×1 IMPLANT
DRAPE LAPAROSCOPIC ABDOMINAL (DRAPES) ×1 IMPLANT
ELECT CAUTERY BLADE 6.4 (BLADE) ×1 IMPLANT
ELECTRODE REM PT RTRN 9FT ADLT (ELECTROSURGICAL) ×1 IMPLANT
GEL ULTRASOUND 20GR AQUASONIC (MISCELLANEOUS) IMPLANT
GLOVE BIOGEL PI IND STRL 6 (GLOVE) ×1 IMPLANT
GLOVE SURG SYN 5.5 (GLOVE) ×1 IMPLANT
GLOVE SURG SYN 5.5 PF PI (GLOVE) ×1 IMPLANT
GOWN STRL REUS W/ TWL LRG LVL3 (GOWN DISPOSABLE) ×2 IMPLANT
INTRODUCER COOK 11FR (CATHETERS) IMPLANT
KIT BASIN OR (CUSTOM PROCEDURE TRAY) ×1 IMPLANT
KIT PORT POWER 8FR ISP CVUE (Port) IMPLANT
KIT TURNOVER KIT B (KITS) ×1 IMPLANT
NS IRRIG 1000ML POUR BTL (IV SOLUTION) ×1 IMPLANT
PAD ARMBOARD POSITIONER FOAM (MISCELLANEOUS) ×1 IMPLANT
PENCIL BUTTON HOLSTER BLD 10FT (ELECTRODE) ×1 IMPLANT
POSITIONER HEAD DONUT 9IN (MISCELLANEOUS) ×1 IMPLANT
SET INTRODUCER 12FR PACEMAKER (INTRODUCER) IMPLANT
SET SHEATH INTRODUCER 10FR (MISCELLANEOUS) IMPLANT
SHEATH COOK PEEL AWAY SET 9F (SHEATH) IMPLANT
SUT MNCRL AB 4-0 PS2 18 (SUTURE) ×1 IMPLANT
SUT PROLENE 2 0 SH DA (SUTURE) ×2 IMPLANT
SUT VIC AB 3-0 SH 27X BRD (SUTURE) ×1 IMPLANT
SYR 5ML LUER SLIP (SYRINGE) ×1 IMPLANT
TOWEL GREEN STERILE (TOWEL DISPOSABLE) ×1 IMPLANT
TOWEL GREEN STERILE FF (TOWEL DISPOSABLE) ×1 IMPLANT
TRAY LAPAROSCOPIC MC (CUSTOM PROCEDURE TRAY) ×1 IMPLANT

## 2024-02-03 NOTE — Anesthesia Postprocedure Evaluation (Signed)
 Anesthesia Post Note  Patient: Sherry Abbott  Procedure(s) Performed: INSERTION PORTACATH RIGHT SUBCLAVIAN (Chest)     Patient location during evaluation: PACU Anesthesia Type: General Level of consciousness: awake and alert Pain management: pain level controlled Vital Signs Assessment: post-procedure vital signs reviewed and stable Respiratory status: spontaneous breathing, nonlabored ventilation, respiratory function stable and patient connected to nasal cannula oxygen Cardiovascular status: blood pressure returned to baseline and stable Postop Assessment: no apparent nausea or vomiting Anesthetic complications: no   There were no known notable events for this encounter.  Last Vitals:  Vitals:   02/03/24 1345 02/03/24 1400  BP: 126/76 121/72  Pulse: 79 79  Resp: 12 10  Temp:  (!) 36.3 C  SpO2: 100% 100%    Last Pain:  Vitals:   02/03/24 1416  TempSrc:   PainSc: 4                  Khyla Mccumbers P Kolby Schara

## 2024-02-03 NOTE — Progress Notes (Signed)
 PATIENT NAVIGATOR PROGRESS NOTE  Name: Sherry Abbott Date: 02/03/2024 MRN: [id number]  DOB: 08-Jan-1976  Patient called office requesting to speak to dietician regarding her current diet. Patient has a history of multiple food allergies and low iron  levels.  Patient currently take iron  supplements and recent iron  level check showed her levels in normal range.  Patient is still recovering from surgery on 6/12 and wants to make sure she is fueling her body with the right things to help her recover from surgery and also to prepare for start of treatment. Informed patient a referral for Dietician has been entered and that I would also send a message to our team to see if they are able to reach out to patient.  Instructed patient to call office if she has not heard from anyone by Monday 6/30.  Will follow-up with patient next week to ensure an appt has been scheduled with a Dietician.    Time spent counseling/coordinating care: 30-45 minutes

## 2024-02-03 NOTE — Op Note (Signed)
 Date: 02/03/24  Patient: Sherry Abbott MRN: [id number]  Preoperative Diagnosis: Stage III colon cancer Postoperative Diagnosis: Same  Procedure: Port-a-cath insertion  Surgeon: Leonor Dawn, MD  EBL: Minimal  Anesthesia: General  Specimens: None  Indications: Ms. Cupples is a 48 yo female who was recently diagnosed with stage III colon cancer, and underwent a right hemicolectomy 6/12. She is to begin adjuvant FOLFOX, and presents today for port placement. After a discussion of the risks and benefits of surgery, she agreed to proceed.  Findings: 8-Fr single-lumen power port placed via the right subclavian vein under fluoroscopic guidance. Total catheter length of 18cm.  Procedure details: Informed consent was obtained in the preoperative area prior to the procedure. The patient was brought to the operating room and placed on the table in the supine position. General anesthesia was induced and perioperative antibiotics were administered per SCIP guidelines. The neck and chest were prepped and draped in the usual sterile fashion. A pre-procedure timeout was taken verifying patient identity, surgical site and procedure to be performed.  The patient was placed in Trendelenberg and the right subclavian vein was accessed with a large-bore needle. A guidewire was inserted and advanced, and position in the SVC was confirmed fluoroscopically. The needle was removed. A small skin incision was made on the right upper chest wall incorporating the wire exit site and a subcutaneous pocket was created with cautery. The port and catheter were then flushed and brought onto the field. Three 2-0 prolene sutures were used to secure the port in the subcutaneous pocket, but the sutures were not tied down. The port was placed in the pocket and the attached cathether was tunneled beneath the skin to the wire exit site. The catheter was measured using fluoro - it was placed over the skin adjacent to the  guidewire, and marked externally at the cavoatrial junction. The catheter was then cut at this location, which was at 18cm. The dilator and sheath were then advanced over the guidewire under real time fluoroscopic guidance, and the wire and dilator were removed. The end of the catheter was inserted through the sheath and advanced, and the sheath was peeled away. The port was then accessed with a Huber needle, and blood was aspirated and the port was flushed with heparinized saline. A final fluoroscopic image confirmed appropriate position of the catheter tip within the SVC, without kinking of the catheter. The prolene sutures were tied down. The skin was closed with a deep dermal layer of interrupted 3-0 Vicryl suture, followed by a running subcuticular 4-0 monocryl suture. A final flush of 500 units heparin (100 units/mL) was given via the port. Dermabond was applied.  The patient tolerated the procedure well with no apparent complications. All counts were correct x2 at the end of the procedure. The patient was extubated and taken to PACU in stable condition.  Leonor Dawn, MD 02/03/24 12:57 PM

## 2024-02-03 NOTE — H&P (Signed)
 Sherry Abbott is an 48 y.o. female.   Chief Complaint: colon cancer HPI: Sherry Abbott is a 48 yo female who recently underwent a right hemicolectomy for a cecal cancer on 01/19/24. Final pathology showed a pT3N1a adenocarcinoma with negative margins. She is to undergo adjuvant FOLFOX and presents today for port placement. She says she has been doing well since discharge after surgery. She is having regular bowel movements and feels her incisions are healing well.  Past Medical History:  Diagnosis Date   Allergic rhinitis    Allergic to food    blueberry   Allergic to shellfish    Asthma    Cancer (HCC)    Dermatographic urticaria    Hidradenitis suppurativa    Labral tear of right hip joint    surgery   Low back pain    Other chronic allergic conjunctivitis    Polyarthralgia     Past Surgical History:  Procedure Laterality Date   CESAREAN SECTION  2008   DERMOID CYST  EXCISION Right 03/2022   right arm in dermatologist office   LABRAL REPAIR Right 04/2021   right hip   LAPAROSCOPIC PARTIAL COLECTOMY Right 01/19/2024   Procedure: LAPAROSCOPIC PARTIAL COLECTOMY;  Surgeon: Dasie Sherry CROME, MD;  Location: [facility] OR;  Service: General;  Laterality: Right;  LAPAROSCOPIC ASSISTED RIGHT HEMI COLECTOMY   ROTATOR CUFF REPAIR Right    2015    Family History  Problem Relation Age of Onset   Migraines Mother    Pulmonary disease Mother    Cancer Father    Social History:  reports that she has never smoked. She has never used smokeless tobacco. She reports that she does not drink alcohol and does not use drugs.  Allergies:  Allergies  Allergen Reactions   Betadine [Povidone Iodine] Rash   Blueberry [Vaccinium Angustifolium] Itching   Milk-Related Compounds Anaphylaxis    No milk or other dairy products   Neosporin [Neomycin-Bacitracin Zn-Polymyx] Other (See Comments)    Blistering rash   Shellfish Allergy Anaphylaxis   Sulfa Antibiotics Rash    Facility-Administered  Medications Prior to Admission  Medication Dose Route Frequency Provider Last Rate Last Admin   Fremanezumab -vfrm SOSY 225 mg  225 mg Subcutaneous Once Ahern, Antonia B, MD       Medications Prior to Admission  Medication Sig Dispense Refill   B Complex Vitamins (VITAMIN B-COMPLEX) TABS Take 1 tablet by mouth daily at 6 (six) AM.     cetirizine (ZYRTEC ALLERGY) 10 MG tablet Take 10 mg by mouth daily.     docusate sodium  (COLACE) 100 MG capsule Take 2 capsules (200 mg total) by mouth 2 (two) times daily as needed for mild constipation. 25 capsule 0   Fluocinolone Acetonide Scalp 0.01 % OIL Apply 1 application  topically once a week. Apply to scalp and neck     folic acid (FOLVITE) 1 MG tablet Take 1 mg by mouth daily.     methotrexate (RHEUMATREX) 2.5 MG tablet Take 15 mg by mouth every Friday. 6 tablets     montelukast (SINGULAIR) 10 MG tablet Take 10 mg by mouth at bedtime.     Multiple Vitamin (MULTIVITAMIN WITH MINERALS) TABS tablet Take 1 tablet by mouth daily.     ondansetron  (ZOFRAN ) 4 MG tablet Take 1 tablet (4 mg total) by mouth every 8 (eight) hours as needed for nausea or vomiting. 10 tablet 0   oxyCODONE  (OXY IR/ROXICODONE ) 5 MG immediate release tablet Take 1 tablet (5 mg total)  by mouth every 8 (eight) hours as needed for severe pain (pain score 7-10) (5 mg for pain score 4-6, 10 mg for pain score 7-10). 12 tablet 0   pantoprazole  (PROTONIX ) 20 MG tablet Take 20 mg by mouth 2 (two) times daily.     polyethylene glycol powder (GLYCOLAX /MIRALAX ) 17 GM/SCOOP powder Take 17 g by mouth daily as needed. 238 g 0   dexamethasone  (DECADRON ) 4 MG tablet Take 2 tablets (8 mg total) by mouth daily. Start the day after chemotherapy for 2 days. Take with food. 30 tablet 1   fluticasone (FLONASE) 50 MCG/ACT nasal spray Place 2 sprays into both nostrils daily as needed.     lidocaine -prilocaine  (EMLA ) cream Apply to affected area once 30 g 3   mometasone (NASONEX) 50 MCG/ACT nasal spray Place 2  sprays into the nose daily as needed (Allergies).     ondansetron  (ZOFRAN ) 8 MG tablet Take 1 tablet (8 mg total) by mouth every 8 (eight) hours as needed for nausea or vomiting. Start on the third day after chemotherapy. 30 tablet 1   prochlorperazine  (COMPAZINE ) 10 MG tablet Take 1 tablet (10 mg total) by mouth every 6 (six) hours as needed for nausea or vomiting. 30 tablet 1    Results for orders placed or performed during the hospital encounter of 02/03/24 (from the past 48 hours)  Pregnancy, urine POC     Status: None   Collection Time: 02/03/24  9:40 AM  Result Value Ref Range   Preg Test, Ur NEGATIVE NEGATIVE    Comment:        THE SENSITIVITY OF THIS METHODOLOGY IS >20 mIU/mL.    No results found.  Review of Systems  Blood pressure 104/75, pulse 68, temperature 98.3 F (36.8 C), temperature source Oral, resp. rate 18, height 5' 5 (1.651 m), weight 65.3 kg, last menstrual period 12/26/2023, SpO2 100%. Physical Exam Vitals reviewed.  Constitutional:      General: She is not in acute distress.    Appearance: Normal appearance.   Eyes:     General: No scleral icterus.    Conjunctiva/sclera: Conjunctivae normal.   Pulmonary:     Effort: Pulmonary effort is normal. No respiratory distress.  Abdominal:     General: There is no distension.     Palpations: Abdomen is soft.     Tenderness: There is no abdominal tenderness.     Comments: Incisions are clean and dry with no erythema, induration or drainage.   Skin:    General: Skin is warm and dry.     Coloration: Skin is not jaundiced.   Neurological:     General: No focal deficit present.     Mental Status: She is alert and oriented to person, place, and time.      Assessment/Plan 48 yo female with stage III colon adenocarcinoma, s/p right hemicolectomy on 01/19/24. She is to start adjuvant chemotherapy and presents today for port placement. I reviewed the details of the procedure, including the benefits and the risk  of pneumothorax, as well as postoperative instructions. She expressed understanding and agrees to proceed with surgery. Plan for CXR in PACU and discharge home.  Sherry LITTIE Dawn, MD 02/03/2024, 11:38 AM

## 2024-02-03 NOTE — Anesthesia Procedure Notes (Signed)
 Procedure Name: Intubation Date/Time: 02/03/2024 12:18 PM  Performed by: Tressie Gilmore RAMAN, CRNAPre-anesthesia Checklist: Patient identified, Emergency Drugs available, Suction available and Patient being monitored Patient Re-evaluated:Patient Re-evaluated prior to induction Oxygen Delivery Method: Circle System Utilized Preoxygenation: Pre-oxygenation with 100% oxygen Induction Type: IV induction Ventilation: Mask ventilation without difficulty Laryngoscope Size: Miller and 2 Grade View: Grade II Tube type: Oral Tube size: 7.0 mm Number of attempts: 1 Airway Equipment and Method: Stylet and Oral airway Placement Confirmation: ETT inserted through vocal cords under direct vision, positive ETCO2 and breath sounds checked- equal and bilateral Secured at: 22 cm Tube secured with: Tape Dental Injury: Teeth and Oropharynx as per pre-operative assessment

## 2024-02-03 NOTE — Transfer of Care (Signed)
 Immediate Anesthesia Transfer of Care Note  Patient: Sherry Abbott  Procedure(s) Performed: INSERTION PORTACATH RIGHT SUBCLAVIAN (Chest)  Patient Location: PACU  Anesthesia Type:General  Level of Consciousness: awake and alert   Airway & Oxygen Therapy: Patient Spontanous Breathing and Patient connected to nasal cannula oxygen  Post-op Assessment: Report given to RN and Post -op Vital signs reviewed and stable  Post vital signs: Reviewed and stable  Last Vitals:  Vitals Value Taken Time  BP 130/64 02/03/24 13:07  Temp 98.6   Pulse 90 02/03/24 13:09  Resp 14 02/03/24 13:10  SpO2 100 % 02/03/24 13:09  Vitals shown include unfiled device data.  Last Pain:  Vitals:   02/03/24 0924  TempSrc:   PainSc: 0-No pain         Complications: There were no known notable events for this encounter.

## 2024-02-03 NOTE — Progress Notes (Signed)
 Orders entered for Guardant reveal per Dr. Autumn.

## 2024-02-03 NOTE — Anesthesia Preprocedure Evaluation (Signed)
 Anesthesia Evaluation  Patient identified by MRN, date of birth, ID band Patient awake    Reviewed: Allergy & Precautions, NPO status , Patient's Chart, lab work & pertinent test results  Airway Mallampati: II  TM Distance: >3 FB Neck ROM: Full    Dental no notable dental hx.    Pulmonary asthma    Pulmonary exam normal        Cardiovascular negative cardio ROS  Rhythm:Regular Rate:Normal     Neuro/Psych  Headaches  negative psych ROS   GI/Hepatic Neg liver ROS,GERD  Medicated,,Colon Ca   Endo/Other  negative endocrine ROS    Renal/GU negative Renal ROS  negative genitourinary   Musculoskeletal  (+) Arthritis , Osteoarthritis,    Abdominal Normal abdominal exam  (+)   Peds  Hematology  (+) Blood dyscrasia, anemia   Anesthesia Other Findings   Reproductive/Obstetrics                             Anesthesia Physical Anesthesia Plan  ASA: 3  Anesthesia Plan: General   Post-op Pain Management:    Induction: Intravenous  PONV Risk Score and Plan: 3 and Ondansetron , Dexamethasone , Midazolam  and Treatment may vary due to age or medical condition  Airway Management Planned: Mask and LMA  Additional Equipment: None  Intra-op Plan:   Post-operative Plan: Extubation in OR  Informed Consent: I have reviewed the patients History and Physical, chart, labs and discussed the procedure including the risks, benefits and alternatives for the proposed anesthesia with the patient or authorized representative who has indicated his/her understanding and acceptance.     Dental advisory given  Plan Discussed with: CRNA  Anesthesia Plan Comments:        Anesthesia Quick Evaluation

## 2024-02-03 NOTE — Discharge Instructions (Addendum)
 SURGERY DISCHARGE INSTRUCTIONS: PORT-A-CATH PLACEMENT  Activity You may resume your usual activities as tolerated Ok to shower in 48 hours, but do not bathe or submerge incision underwater for 2 weeks. Do not drive while taking narcotic pain medication.  Wound Care Your incision is covered with skin glue called Dermabond. This will peel off on its own over time. You may shower and allow warm soapy water  to run over your incisions. Gently pat dry. Do not submerge your incision underwater. Monitor your incision for any new redness, tenderness, or drainage. You may start using your port in 48 hours. Do not apply EMLA  cream directly over the Dermabond (skin glue).  When to Call Us : Fever greater than 100.5 New redness, drainage, or swelling at incision site Severe pain, nausea, or vomiting Shortness of breath, difficulty breathing   Follow-up You have an appointment scheduled with Dr. Dasie on July 25 at 9:30am. This will be at the [facility] office at [address], [address], [place], [place]. Please arrive at least 15 minutes prior to your scheduled appointment time.  IF YOU HAVE DISABILITY OR FAMILY LEAVE FORMS, YOU MUST BRING THEM TO THE OFFICE FOR PROCESSING.   DO NOT GIVE THEM TO YOUR DOCTOR.  The clinic staff is available to answer your questions during regular business hours.  Please don't hesitate to call and ask to speak to one of the nurses for clinical concerns.  If you have a medical emergency, go to the nearest emergency room or call 911.  A surgeon from [facility] is always on call at the hospital  [address], [place], [place]  [address] ?  [address] [address], [place], [place]   [address] [phone] ? Toll Free: [phone] ? FAX [phone] Web site: [email or web address]      Managing Your Pain After Surgery Without Opioids    Thank you for participating in our program to help patients manage their pain  after surgery without opioids. This is part of our effort to provide you with the best care possible, without exposing you or your family to the risk that opioids pose.  What pain can I expect after surgery? You can expect to have some pain after surgery. This is normal. The pain is typically worse the day after surgery, and quickly begins to get better. Many studies have found that many patients are able to manage their pain after surgery with Over-the-Counter (OTC) medications such as Tylenol  and Motrin . If you have a condition that does not allow you to take Tylenol  or Motrin , notify your surgical team.  How will I manage my pain? The best strategy for controlling your pain after surgery is around the clock pain control with Tylenol  (acetaminophen ) and Motrin  (ibuprofen  or Advil ). Alternating these medications with each other allows you to maximize your pain control. In addition to Tylenol  and Motrin , you can use heating pads or ice packs on your incisions to help reduce your pain.  How will I alternate your regular strength over-the-counter pain medication? You will take a dose of pain medication every three hours. Start by taking 650 mg of Tylenol  (2 pills of 325 mg) 3 hours later take 600 mg of Motrin  (3 pills of 200 mg) 3 hours after taking the Motrin  take 650 mg of Tylenol  3 hours after that take 600 mg of Motrin .   - 1 -  See example - if your first dose of Tylenol  is at 12:00 PM   12:00  PM Tylenol  650 mg (2 pills of 325 mg)  3:00 PM Motrin  600 mg (3 pills of 200 mg)  6:00 PM Tylenol  650 mg (2 pills of 325 mg)  9:00 PM Motrin  600 mg (3 pills of 200 mg)  Continue alternating every 3 hours   We recommend that you follow this schedule around-the-clock for at least 3 days after surgery, or until you feel that it is no longer needed. Use the table on the last page of this handout to keep track of the medications you are taking. Important: Do not take more than 3000mg  of Tylenol  or  3200mg  of Motrin  in a 24-hour period. Do not take ibuprofen /Motrin  if you have a history of bleeding stomach ulcers, severe kidney disease, &/or actively taking a blood thinner  What if I still have pain? If you have pain that is not controlled with the over-the-counter pain medications (Tylenol  and Motrin  or Advil ) you might have what we call "breakthrough" pain. You will receive a prescription for a small amount of an opioid pain medication such as Oxycodone , Tramadol, or Tylenol  with Codeine. Use these opioid pills in the first 24 hours after surgery if you have breakthrough pain. Do not take more than 1 pill every 4-6 hours.  If you still have uncontrolled pain after using all opioid pills, don't hesitate to call our staff using the number provided. We will help make sure you are managing your pain in the best way possible, and if necessary, we can provide a prescription for additional pain medication.   Day 1    Time  Name of Medication Number of pills taken  Amount of Acetaminophen   Pain Level   Comments  AM PM       AM PM       AM PM       AM PM       AM PM       AM PM       AM PM       AM PM       Total Daily amount of Acetaminophen  Do not take more than  3,000 mg per day      Day 2    Time  Name of Medication Number of pills taken  Amount of Acetaminophen   Pain Level   Comments  AM PM       AM PM       AM PM       AM PM       AM PM       AM PM       AM PM       AM PM       Total Daily amount of Acetaminophen  Do not take more than  3,000 mg per day      Day 3    Time  Name of Medication Number of pills taken  Amount of Acetaminophen   Pain Level   Comments  AM PM       AM PM       AM PM       AM PM         AM PM       AM PM       AM PM       AM PM       Total Daily amount of Acetaminophen  Do not take more than  3,000 mg per day      Day 4  Time  Name of Medication Number of pills taken  Amount of Acetaminophen   Pain Level    Comments  AM PM       AM PM       AM PM       AM PM       AM PM       AM PM       AM PM       AM PM       Total Daily amount of Acetaminophen  Do not take more than  3,000 mg per day      Day 5    Time  Name of Medication Number of pills taken  Amount of Acetaminophen   Pain Level   Comments  AM PM       AM PM       AM PM       AM PM       AM PM       AM PM       AM PM       AM PM       Total Daily amount of Acetaminophen  Do not take more than  3,000 mg per day      Day 6    Time  Name of Medication Number of pills taken  Amount of Acetaminophen   Pain Level  Comments  AM PM       AM PM       AM PM       AM PM       AM PM       AM PM       AM PM       AM PM       Total Daily amount of Acetaminophen  Do not take more than  3,000 mg per day      Day 7    Time  Name of Medication Number of pills taken  Amount of Acetaminophen   Pain Level   Comments  AM PM       AM PM       AM PM       AM PM       AM PM       AM PM       AM PM       AM PM       Total Daily amount of Acetaminophen  Do not take more than  3,000 mg per day        For additional information about how and where to safely dispose of unused opioid medications - [employer]  Disclaimer: This document contains information and/or instructional materials adapted from [facility] for the typical patient with your condition. It does not replace medical advice from your health care provider because your experience may differ from that of the typical patient. Talk to your health care provider if you have any questions about this document, your condition or your treatment plan. Adapted from [facility]

## 2024-02-06 ENCOUNTER — Inpatient Hospital Stay

## 2024-02-06 ENCOUNTER — Encounter (HOSPITAL_COMMUNITY): Payer: Self-pay | Admitting: Surgery

## 2024-02-06 DIAGNOSIS — C180 Malignant neoplasm of cecum: Secondary | ICD-10-CM

## 2024-02-06 NOTE — Progress Notes (Signed)
 PATIENT NAVIGATOR PROGRESS NOTE  Name: Sherry Abbott Date: 02/06/2024 MRN: [id number]  DOB: [birth year]   Patient called office with questions regarding lidocaine  cream prescription.  Patient stated she had her port placed on Friday 6/27 and wanted to know when she could use the lidocaine  cream.  Patient was given instruction to wait to use cream until after 2 weeks after placement date.  Patient stated port area and arm are still sore with bruising and small area of redness at incision site.  Patient stated she has been taking tylenol  for pain.  Advised patient that she can also apply ice to area to help with some of the discomfort.  Patient verbalized understanding.  Patient requested for her labs to be drawn peripherally today at her lab appointment.  Lab appointment changed to accommodate this.    Patient also inquired about the nutrition consult that was entered during her initial appointment.  Message sent to Dietician team to follow-up with patient.   Patient advised to contact office with any future questions or concerns.  Patient verbalized agreement.  Time spent counseling/coordinating care: 30-45 minutes

## 2024-02-07 ENCOUNTER — Ambulatory Visit (HOSPITAL_COMMUNITY)
Admission: RE | Admit: 2024-02-07 | Discharge: 2024-02-07 | Disposition: A | Discharge status: Home / Self Care | Source: Ambulatory Visit | Attending: Oncology | Admitting: Oncology

## 2024-02-07 ENCOUNTER — Telehealth: Payer: Self-pay | Admitting: Dietician

## 2024-02-07 ENCOUNTER — Telehealth: Payer: Self-pay

## 2024-02-07 ENCOUNTER — Inpatient Hospital Stay: Attending: Oncology | Admitting: Dietician

## 2024-02-07 DIAGNOSIS — Z888 Allergy status to other drugs, medicaments and biological substances status: Secondary | ICD-10-CM | POA: Insufficient documentation

## 2024-02-07 DIAGNOSIS — Z79899 Other long term (current) drug therapy: Secondary | ICD-10-CM | POA: Insufficient documentation

## 2024-02-07 DIAGNOSIS — J45909 Unspecified asthma, uncomplicated: Secondary | ICD-10-CM | POA: Insufficient documentation

## 2024-02-07 DIAGNOSIS — Z5111 Encounter for antineoplastic chemotherapy: Secondary | ICD-10-CM | POA: Insufficient documentation

## 2024-02-07 DIAGNOSIS — R112 Nausea with vomiting, unspecified: Secondary | ICD-10-CM | POA: Insufficient documentation

## 2024-02-07 DIAGNOSIS — M069 Rheumatoid arthritis, unspecified: Secondary | ICD-10-CM | POA: Insufficient documentation

## 2024-02-07 DIAGNOSIS — D649 Anemia, unspecified: Secondary | ICD-10-CM | POA: Insufficient documentation

## 2024-02-07 DIAGNOSIS — R5383 Other fatigue: Secondary | ICD-10-CM | POA: Insufficient documentation

## 2024-02-07 DIAGNOSIS — Z79630 Long term (current) use of alkylating agent: Secondary | ICD-10-CM | POA: Insufficient documentation

## 2024-02-07 DIAGNOSIS — Z79631 Long term (current) use of antimetabolite agent: Secondary | ICD-10-CM | POA: Insufficient documentation

## 2024-02-07 DIAGNOSIS — K5900 Constipation, unspecified: Secondary | ICD-10-CM | POA: Insufficient documentation

## 2024-02-07 DIAGNOSIS — K7689 Other specified diseases of liver: Secondary | ICD-10-CM | POA: Insufficient documentation

## 2024-02-07 DIAGNOSIS — R1031 Right lower quadrant pain: Secondary | ICD-10-CM | POA: Insufficient documentation

## 2024-02-07 DIAGNOSIS — Z882 Allergy status to sulfonamides status: Secondary | ICD-10-CM | POA: Insufficient documentation

## 2024-02-07 DIAGNOSIS — R0989 Other specified symptoms and signs involving the circulatory and respiratory systems: Secondary | ICD-10-CM | POA: Insufficient documentation

## 2024-02-07 DIAGNOSIS — T451X5A Adverse effect of antineoplastic and immunosuppressive drugs, initial encounter: Secondary | ICD-10-CM | POA: Insufficient documentation

## 2024-02-07 DIAGNOSIS — C180 Malignant neoplasm of cecum: Secondary | ICD-10-CM | POA: Insufficient documentation

## 2024-02-07 DIAGNOSIS — R479 Unspecified speech disturbances: Secondary | ICD-10-CM | POA: Insufficient documentation

## 2024-02-07 DIAGNOSIS — Z9049 Acquired absence of other specified parts of digestive tract: Secondary | ICD-10-CM | POA: Insufficient documentation

## 2024-02-07 DIAGNOSIS — R253 Fasciculation: Secondary | ICD-10-CM | POA: Insufficient documentation

## 2024-02-07 MED ORDER — IOHEXOL 300 MG/ML  SOLN
75.0000 mL | Freq: Once | INTRAMUSCULAR | Status: AC | PRN
  Administered 2024-02-07: 75 mL via INTRAVENOUS

## 2024-02-07 MED ORDER — SODIUM CHLORIDE (PF) 0.9 % IJ SOLN
INTRAMUSCULAR | Status: AC
  Filled 2024-02-07: qty 50

## 2024-02-07 NOTE — Telephone Encounter (Signed)
 Nutrition Assessment   Reason for Assessment: New Colorectal   ASSESSMENT: 48 year old female with stage III with adenocarcinoma of cecum. S/p right hemicolectomy under the care of Dr. Dasie on 6/12. Patient is planning adjuvant chemotherapy with Folfox q14d (start 7/9). Patient is under the care of Dr. Autumn.  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 She reports doing well overall, other than significant fatigue which has been ongoing s/p anemia diagnosed ~2 months ago. Patient is tolerating soft diet. She is pescatarian. Food choices are limited due to higher fiber content of plant-based proteins as well as milk and related compounds allergy. Recalls smoothie (banana, peaches, cherries), scrambled eggs, toast, watery oatmeal/grits for breakfast. Eats a variety of foods for lunch/dinner including salmon, flounder, pasta with veg broth, mashed potatoes, carrots, green beans. Patient has not been able to drink plant-based ONS. The brand at home is high in fiber. Patient denies nausea, vomiting, diarrhea, constipation. She is taking colace BID + daily miralax  per surgery. She drinks water  through out the day.   Nutrition Focused Physical Exam: unable to complete - telephone visit    Medications: B-complex, decadron , colace, folvite, methotrexate, MVI, zofran , roxicodone , protonix , miralax , compazine , folic acid   Labs: 6/25 - Na 134   Anthropometrics:   Height: 5'5 Weight: 146 lb 4.8 oz (6/26) UBW: 157-160 lb (2024) BMI: 24.35   NUTRITION DIAGNOSIS: Altered GI function related to adenocarcinoma of cecum as evidenced by s/p right hemicolectomy, low fiber diet, 9% wt decrease from usual wt in the last 12 months which is insignificant for time frame   INTERVENTION:  Educated on low fiber diet, foods okay to eat  and foods to avoid  Recommend 4-6 small meals vs 3 larger meals Suggested OWYN high protein shakes for low-fiber plant based option, orgain protein powders Discussed okay to have strained vegetable juices  Continue bowel regimen per surgery Will continue working with pt on diet advancement  Handout on low-fiber + web sources for recipes sent via email Planning to provide cookbook 7/9 during first treatment   MONITORING, EVALUATION, GOAL: Pt will tolerate adequate calories and protein to support post-op healing, minimize wt loss during adjuvant therapy   Next Visit: Monday July 21 during infusion with Joli

## 2024-02-07 NOTE — Progress Notes (Signed)
 Pharmacist Chemotherapy Monitoring - Initial Assessment    Anticipated start date: 02/15/24   The following has been reviewed per standard work regarding the patient's treatment regimen: The patient's diagnosis, treatment plan and drug doses, and organ/hematologic function Lab orders and baseline tests specific to treatment regimen  The treatment plan start date, drug sequencing, and pre-medications Prior authorization status  Patient's documented medication list, including drug-drug interaction screen and prescriptions for anti-emetics and supportive care specific to the treatment regimen The drug concentrations, fluid compatibility, administration routes, and timing of the medications to be used The patient's access for treatment and lifetime cumulative dose history, if applicable  The patient's medication allergies and previous infusion related reactions, if applicable   Changes made to treatment plan:  N/A  Follow up needed:  Confirm pt has stopped her methotrexate (PO) - MD note she would stop during chemo.  Still on home med list.   Mistie Adney, Pharm.D., CPP 02/07/2024@10 :53 AM

## 2024-02-07 NOTE — Telephone Encounter (Signed)
 [facility] Clinical Social Work  Clinical Social Work was referred by distress screen protocol for distress screen needs.  Clinical Social Worker attempted to contact patient by phone to offer support and assess for needs.     Interventions: CSW attempted to reach patient. Unable to leave VM - Mailbox Full      Follow Up Plan:  CSW will attempt to reach patient again.    Lizbeth Sprague, LCSW  Clinical Social Worker [facility]

## 2024-02-08 ENCOUNTER — Inpatient Hospital Stay

## 2024-02-08 NOTE — Progress Notes (Signed)
 [facility] Psychosocial Distress Screening Clinical Social Work  Vidya Bamford is a 48 y.o. year old female. Clinical Social Work was referred by nurse for positive distress screening. The patient scored a 5 on the Psychosocial Distress Thermometer which indicates mild distress. Clinical Social Worker contacted patient by phone to assess for distress and other psychosocial needs.     Distress Screen:    02/06/2024    5:17 PM  ONCBCN DISTRESS SCREENING  Screening Type Initial Screening  How much distress have you been experiencing in the past week? (0-10) 5  Practical concerns type Taking care of myself;Taking care of others;Work  Social concerns type Relationship with spouse or partner;Relationship with children;Relationship with family members;Relationship with friends or coworkers  Emotional concerns type Worry or anxiety;Sadness or depression  Spiritual/Religous concerns type Death, dying, or afterlife  Physical Concerns Type  Sleep;Fatigue     Interventions: CSW and patient discussed areas of impact mentioned above. CSW provided emotional and social support as patient detailed experiences.   CSW discussed common feelings and emotions experienced when diagnosed with cancer  CSW provided contact information and encouraged patient to call with any questions or concerns.   Follow Up Plan: CSW will see patient on on 7/09 to further discuss social and emotional supports offered through the cancer center Patient verbalizes understanding of plan: Yes   Lizbeth Sprague, LCSW

## 2024-02-09 ENCOUNTER — Encounter: Payer: Self-pay | Admitting: Oncology

## 2024-02-09 ENCOUNTER — Ambulatory Visit: Admitting: Oncology

## 2024-02-10 ENCOUNTER — Other Ambulatory Visit: Payer: Self-pay

## 2024-02-14 ENCOUNTER — Encounter: Payer: Self-pay | Admitting: Oncology

## 2024-02-15 ENCOUNTER — Inpatient Hospital Stay

## 2024-02-15 ENCOUNTER — Other Ambulatory Visit: Payer: Self-pay

## 2024-02-15 ENCOUNTER — Inpatient Hospital Stay (HOSPITAL_BASED_OUTPATIENT_CLINIC_OR_DEPARTMENT_OTHER): Admitting: Oncology

## 2024-02-15 ENCOUNTER — Encounter: Payer: Self-pay | Admitting: Oncology

## 2024-02-15 VITALS — BP 105/73 | HR 78 | Temp 97.9°F | Resp 17 | Ht 65.0 in | Wt 149.0 lb

## 2024-02-15 DIAGNOSIS — J45909 Unspecified asthma, uncomplicated: Secondary | ICD-10-CM | POA: Diagnosis not present

## 2024-02-15 DIAGNOSIS — Z79631 Long term (current) use of antimetabolite agent: Secondary | ICD-10-CM | POA: Diagnosis not present

## 2024-02-15 DIAGNOSIS — Z5111 Encounter for antineoplastic chemotherapy: Secondary | ICD-10-CM | POA: Insufficient documentation

## 2024-02-15 DIAGNOSIS — M069 Rheumatoid arthritis, unspecified: Secondary | ICD-10-CM

## 2024-02-15 DIAGNOSIS — T451X5A Adverse effect of antineoplastic and immunosuppressive drugs, initial encounter: Secondary | ICD-10-CM | POA: Diagnosis not present

## 2024-02-15 DIAGNOSIS — R5383 Other fatigue: Secondary | ICD-10-CM | POA: Insufficient documentation

## 2024-02-15 DIAGNOSIS — D649 Anemia, unspecified: Secondary | ICD-10-CM

## 2024-02-15 DIAGNOSIS — Z79630 Long term (current) use of alkylating agent: Secondary | ICD-10-CM | POA: Diagnosis not present

## 2024-02-15 DIAGNOSIS — C180 Malignant neoplasm of cecum: Secondary | ICD-10-CM

## 2024-02-15 DIAGNOSIS — K5900 Constipation, unspecified: Secondary | ICD-10-CM | POA: Diagnosis not present

## 2024-02-15 DIAGNOSIS — R1031 Right lower quadrant pain: Secondary | ICD-10-CM | POA: Diagnosis not present

## 2024-02-15 DIAGNOSIS — Z882 Allergy status to sulfonamides status: Secondary | ICD-10-CM | POA: Diagnosis not present

## 2024-02-15 DIAGNOSIS — K7689 Other specified diseases of liver: Secondary | ICD-10-CM | POA: Diagnosis not present

## 2024-02-15 DIAGNOSIS — Z79899 Other long term (current) drug therapy: Secondary | ICD-10-CM | POA: Diagnosis not present

## 2024-02-15 DIAGNOSIS — Z888 Allergy status to other drugs, medicaments and biological substances status: Secondary | ICD-10-CM | POA: Diagnosis not present

## 2024-02-15 DIAGNOSIS — R0989 Other specified symptoms and signs involving the circulatory and respiratory systems: Secondary | ICD-10-CM | POA: Diagnosis not present

## 2024-02-15 DIAGNOSIS — Z9049 Acquired absence of other specified parts of digestive tract: Secondary | ICD-10-CM | POA: Diagnosis not present

## 2024-02-15 DIAGNOSIS — R112 Nausea with vomiting, unspecified: Secondary | ICD-10-CM | POA: Diagnosis not present

## 2024-02-15 DIAGNOSIS — Z95828 Presence of other vascular implants and grafts: Secondary | ICD-10-CM | POA: Insufficient documentation

## 2024-02-15 DIAGNOSIS — R253 Fasciculation: Secondary | ICD-10-CM | POA: Diagnosis not present

## 2024-02-15 DIAGNOSIS — R479 Unspecified speech disturbances: Secondary | ICD-10-CM | POA: Diagnosis not present

## 2024-02-15 LAB — CBC WITH DIFFERENTIAL (CANCER CENTER ONLY)
Abs Immature Granulocytes: 0.01 K/uL (ref 0.00–0.07)
Basophils Absolute: 0 K/uL (ref 0.0–0.1)
Basophils Relative: 1 %
Eosinophils Absolute: 0.2 K/uL (ref 0.0–0.5)
Eosinophils Relative: 4 %
HCT: 26.7 % — ABNORMAL LOW (ref 36.0–46.0)
Hemoglobin: 9.1 g/dL — ABNORMAL LOW (ref 12.0–15.0)
Immature Granulocytes: 0 %
Lymphocytes Relative: 33 %
Lymphs Abs: 1.3 K/uL (ref 0.7–4.0)
MCH: 28.5 pg (ref 26.0–34.0)
MCHC: 34.1 g/dL (ref 30.0–36.0)
MCV: 83.7 fL (ref 80.0–100.0)
Monocytes Absolute: 0.3 K/uL (ref 0.1–1.0)
Monocytes Relative: 6 %
Neutro Abs: 2.3 K/uL (ref 1.7–7.7)
Neutrophils Relative %: 56 %
Platelet Count: 212 K/uL (ref 150–400)
RBC: 3.19 MIL/uL — ABNORMAL LOW (ref 3.87–5.11)
RDW: 15.9 % — ABNORMAL HIGH (ref 11.5–15.5)
WBC Count: 4 K/uL (ref 4.0–10.5)
nRBC: 0 % (ref 0.0–0.2)

## 2024-02-15 LAB — CMP (CANCER CENTER ONLY)
ALT: 15 U/L (ref 0–44)
AST: 18 U/L (ref 15–41)
Albumin: 4 g/dL (ref 3.5–5.0)
Alkaline Phosphatase: 42 U/L (ref 38–126)
Anion gap: 6 (ref 5–15)
BUN: 14 mg/dL (ref 6–20)
CO2: 28 mmol/L (ref 22–32)
Calcium: 9.1 mg/dL (ref 8.9–10.3)
Chloride: 106 mmol/L (ref 98–111)
Creatinine: 0.68 mg/dL (ref 0.44–1.00)
GFR, Estimated: >60 mL/min (ref >60)
Glucose, Bld: 87 mg/dL (ref 70–99)
Potassium: 3.8 mmol/L (ref 3.5–5.1)
Sodium: 140 mmol/L (ref 135–145)
Total Bilirubin: 0.3 mg/dL (ref 0.0–1.2)
Total Protein: 7 g/dL (ref 6.5–8.1)

## 2024-02-15 MED ORDER — SODIUM CHLORIDE 0.9 % IV SOLN
2400.0000 mg/m2 | INTRAVENOUS | Status: DC
  Administered 2024-02-15: 4200 mg via INTRAVENOUS
  Filled 2024-02-15: qty 84

## 2024-02-15 MED ORDER — DEXAMETHASONE SODIUM PHOSPHATE 10 MG/ML IJ SOLN
10.0000 mg | Freq: Once | INTRAMUSCULAR | Status: AC
  Administered 2024-02-15: 10 mg via INTRAVENOUS
  Filled 2024-02-15: qty 1

## 2024-02-15 MED ORDER — SODIUM CHLORIDE 0.9% FLUSH: 10.0000 mL | INTRAVENOUS | Status: DC | PRN

## 2024-02-15 MED ORDER — OXALIPLATIN CHEMO INJECTION 100 MG/20ML
85.0000 mg/m2 | Freq: Once | INTRAVENOUS | Status: AC
  Administered 2024-02-15: 150 mg via INTRAVENOUS
  Filled 2024-02-15: qty 10

## 2024-02-15 MED ORDER — DEXTROSE 5 % IV SOLN: INTRAVENOUS | Status: DC

## 2024-02-15 MED ORDER — LEUCOVORIN CALCIUM INJECTION 350 MG
400.0000 mg/m2 | Freq: Once | INTRAVENOUS | Status: AC
  Administered 2024-02-15: 700 mg via INTRAVENOUS
  Filled 2024-02-15: qty 35

## 2024-02-15 MED ORDER — SODIUM CHLORIDE 0.9% FLUSH
10.0000 mL | Freq: Once | INTRAVENOUS | Status: AC
  Administered 2024-02-15: 10 mL

## 2024-02-15 MED ORDER — PALONOSETRON HCL INJECTION 0.25 MG/5ML
0.2500 mg | Freq: Once | INTRAVENOUS | Status: AC
  Administered 2024-02-15: 0.25 mg via INTRAVENOUS
  Filled 2024-02-15: qty 5

## 2024-02-15 MED ORDER — HEPARIN SOD (PORK) LOCK FLUSH 100 UNIT/ML IV SOLN: 500.0000 [IU] | Freq: Once | INTRAVENOUS | Status: DC | PRN

## 2024-02-15 NOTE — Progress Notes (Signed)
 Pt c/o of frostbite burning in fingers.  Pt just had been up to BR & stated maybe she was not washing hands in warm enough water . She had been journaling most of day. The ink pen has metal on it. Paused Oxaliplatin  & LV & given warm pack to see if that helps..  Pt reports that it helps. Notified Kaitlyn PA.  Will also notify Dr Autumn. Dr Autumn back to infusion to see pt & gave OK to continue with Ox/LV.

## 2024-02-15 NOTE — Assessment & Plan Note (Signed)
 Anemia with hemoglobin level of 9.1, contributing to fatigue. Likely related to cancer diagnosis and treatment. Goal to maintain hemoglobin above 8 during chemotherapy to avoid transfusions. - Monitor hemoglobin levels and consider transfusion if levels drop below 8 during chemotherapy. - Continue dietary modifications and iron  supplementation as discussed with the nutritionist.

## 2024-02-15 NOTE — Patient Instructions (Signed)
 [facility] - A DEPT OF [facility]  Discharge Instructions: Thank you for choosing [facility] to provide your oncology and hematology care.   If you have a lab appointment with the Cancer Center, please go directly to the Cancer Center and check in at the registration area.   Wear comfortable clothing and clothing appropriate for easy access to any Portacath or PICC line.   We strive to give you quality time with your provider. You may need to reschedule your appointment if you arrive late (15 or more minutes).  Arriving late affects you and other patients whose appointments are after yours.  Also, if you miss three or more appointments without notifying the office, you may be dismissed from the clinic at the provider's discretion.      For prescription refill requests, have your pharmacy contact our office and allow 72 hours for refills to be completed.    Today you received the following chemotherapy and/or immunotherapy agents: Oxaliplatin  and Fluorouracil       To help prevent nausea and vomiting after your treatment, we encourage you to take your nausea medication as directed.  BELOW ARE SYMPTOMS THAT SHOULD BE REPORTED IMMEDIATELY: *FEVER GREATER THAN 100.4 F (38 C) OR HIGHER *CHILLS OR SWEATING *NAUSEA AND VOMITING THAT IS NOT CONTROLLED WITH YOUR NAUSEA MEDICATION *UNUSUAL SHORTNESS OF BREATH *UNUSUAL BRUISING OR BLEEDING *URINARY PROBLEMS (pain or burning when urinating, or frequent urination) *BOWEL PROBLEMS (unusual diarrhea, constipation, pain near the anus) TENDERNESS IN MOUTH AND THROAT WITH OR WITHOUT PRESENCE OF ULCERS (sore throat, sores in mouth, or a toothache) UNUSUAL RASH, SWELLING OR PAIN  UNUSUAL VAGINAL DISCHARGE OR ITCHING   Items with * indicate a potential emergency and should be followed up as soon as possible or go to the Emergency Department if any problems should occur.  Please show the CHEMOTHERAPY ALERT  CARD or IMMUNOTHERAPY ALERT CARD at check-in to the Emergency Department and triage nurse.  Should you have questions after your visit or need to cancel or reschedule your appointment, please contact [facility] - A DEPT OF [facility]  Dept: [phone]  and follow the prompts.  Office hours are 8:00 a.m. to 4:30 p.m. Monday - Friday. Please note that voicemails left after 4:00 p.m. may not be returned until the following business day.  We are closed weekends and major holidays. You have access to a nurse at all times for urgent questions. Please call the main number to the clinic Dept: [phone] and follow the prompts.   For any non-urgent questions, you may also contact your provider using MyChart. We now offer e-Visits for anyone 71 and older to request care online for non-urgent symptoms. For details visit [email or web address].   Also download the MyChart app! Go to the app store, search MyChart, open the app, select [facility], and log in with your MyChart username and password.  Oxaliplatin  Injection What is this medication? OXALIPLATIN  (ox AL i PLA tin) treats colorectal cancer. It works by slowing down the growth of cancer cells. This medicine may be used for other purposes; ask your health care provider or pharmacist if you have questions. COMMON BRAND NAME(S): Eloxatin  What should I tell my care team before I take this medication? They need to know if you have any of these conditions: Heart disease History of irregular heartbeat or rhythm Liver disease Low blood cell levels (white cells, red cells, and  platelets) Lung or breathing disease, such as asthma Take medications that treat or prevent blood clots Tingling of the fingers, toes, or other nerve disorder An unusual or allergic reaction to oxaliplatin , other medications, foods, dyes, or preservatives If you or your partner are pregnant or trying to get pregnant Breast-feeding How  should I use this medication? This medication is injected into a vein. It is given by your care team in a hospital or clinic setting. Talk to your care team about the use of this medication in children. Special care may be needed. Overdosage: If you think you have taken too much of this medicine contact a poison control center or emergency room at once. NOTE: This medicine is only for you. Do not share this medicine with others. What if I miss a dose? Keep appointments for follow-up doses. It is important not to miss a dose. Call your care team if you are unable to keep an appointment. What may interact with this medication? Do not take this medication with any of the following: Cisapride Dronedarone Pimozide Thioridazine This medication may also interact with the following: Aspirin and aspirin-like medications Certain medications that treat or prevent blood clots, such as warfarin, apixaban, dabigatran, and rivaroxaban Cisplatin Cyclosporine Diuretics Medications for infection, such as acyclovir, adefovir, amphotericin B, bacitracin, cidofovir, foscarnet, ganciclovir, gentamicin, pentamidine, vancomycin  NSAIDs, medications for pain and inflammation, such as ibuprofen  or naproxen Other medications that cause heart rhythm changes Pamidronate Zoledronic acid This list may not describe all possible interactions. Give your health care provider a list of all the medicines, herbs, non-prescription drugs, or dietary supplements you use. Also tell them if you smoke, drink alcohol, or use illegal drugs. Some items may interact with your medicine. What should I watch for while using this medication? Your condition will be monitored carefully while you are receiving this medication. You may need blood work while taking this medication. This medication may make you feel generally unwell. This is not uncommon as chemotherapy can affect healthy cells as well as cancer cells. Report any side effects.  Continue your course of treatment even though you feel ill unless your care team tells you to stop. This medication may increase your risk of getting an infection. Call your care team for advice if you get a fever, chills, sore throat, or other symptoms of a cold or flu. Do not treat yourself. Try to avoid being around people who are sick. Avoid taking medications that contain aspirin, acetaminophen , ibuprofen , naproxen, or ketoprofen unless instructed by your care team. These medications may hide a fever. Be careful brushing or flossing your teeth or using a toothpick because you may get an infection or bleed more easily. If you have any dental work done, tell your dentist you are receiving this medication. This medication can make you more sensitive to cold. Do not drink cold drinks or use ice. Cover exposed skin before coming in contact with cold temperatures or cold objects. When out in cold weather wear warm clothing and cover your mouth and nose to warm the air that goes into your lungs. Tell your care team if you get sensitive to the cold. Talk to your care team if you or your partner are pregnant or think either of you might be pregnant. This medication can cause serious birth defects if taken during pregnancy and for 9 months after the last dose. A negative pregnancy test is required before starting this medication. A reliable form of contraception is recommended while taking this medication  and for 9 months after the last dose. Talk to your care team about effective forms of contraception. Do not father a child while taking this medication and for 6 months after the last dose. Use a condom while having sex during this time period. Do not breastfeed while taking this medication and for 3 months after the last dose. This medication may cause infertility. Talk to your care team if you are concerned about your fertility. What side effects may I notice from receiving this medication? Side effects that  you should report to your care team as soon as possible: Allergic reactions--skin rash, itching, hives, swelling of the face, lips, tongue, or throat Bleeding--bloody or black, tar-like stools, vomiting blood or brown material that looks like coffee grounds, red or dark brown urine, small red or purple spots on skin, unusual bruising or bleeding Dry cough, shortness of breath or trouble breathing Heart rhythm changes--fast or irregular heartbeat, dizziness, feeling faint or lightheaded, chest pain, trouble breathing Infection--fever, chills, cough, sore throat, wounds that don't heal, pain or trouble when passing urine, general feeling of discomfort or being unwell Liver injury--right upper belly pain, loss of appetite, nausea, light-colored stool, dark yellow or brown urine, yellowing skin or eyes, unusual weakness or fatigue Low red blood cell level--unusual weakness or fatigue, dizziness, headache, trouble breathing Muscle injury--unusual weakness or fatigue, muscle pain, dark yellow or brown urine, decrease in amount of urine Pain, tingling, or numbness in the hands or feet Sudden and severe headache, confusion, change in vision, seizures, which may be signs of posterior reversible encephalopathy syndrome (PRES) Unusual bruising or bleeding Side effects that usually do not require medical attention (report to your care team if they continue or are bothersome): Diarrhea Nausea Pain, redness, or swelling with sores inside the mouth or throat Unusual weakness or fatigue Vomiting This list may not describe all possible side effects. Call your doctor for medical advice about side effects. You may report side effects to FDA at 1-800-FDA-1088. Where should I keep my medication? This medication is given in a hospital or clinic. It will not be stored at home. NOTE: This sheet is a summary. It may not cover all possible information. If you have questions about this medicine, talk to your doctor,  pharmacist, or health care provider.  2024 Elsevier/Gold Standard (2023-07-08 00:00:00) Leucovorin  Injection What is this medication? LEUCOVORIN  (loo koe VOR in) prevents side effects from certain medications, such as methotrexate. It works by increasing folate levels. This helps protect healthy cells in your body. It may also be used to treat anemia caused by low levels of folate. It can also be used with fluorouracil , a type of chemotherapy, to treat colorectal cancer. It works by increasing the effects of fluorouracil  in the body. This medicine may be used for other purposes; ask your health care provider or pharmacist if you have questions. What should I tell my care team before I take this medication? They need to know if you have any of these conditions: Anemia from low levels of vitamin B12 in the blood An unusual or allergic reaction to leucovorin , folic acid, other medications, foods, dyes, or preservatives Pregnant or trying to get pregnant Breastfeeding How should I use this medication? This medication is injected into a vein or a muscle. It is given by your care team in a hospital or clinic setting. Talk to your care team about the use of this medication in children. Special care may be needed. Overdosage: If you think you have  taken too much of this medicine contact a poison control center or emergency room at once. NOTE: This medicine is only for you. Do not share this medicine with others. What if I miss a dose? Keep appointments for follow-up doses. It is important not to miss your dose. Call your care team if you are unable to keep an appointment. What may interact with this medication? Capecitabine Fluorouracil  Phenobarbital Phenytoin Primidone Trimethoprim;sulfamethoxazole This list may not describe all possible interactions. Give your health care provider a list of all the medicines, herbs, non-prescription drugs, or dietary supplements you use. Also tell them if you  smoke, drink alcohol, or use illegal drugs. Some items may interact with your medicine. What should I watch for while using this medication? Your condition will be monitored carefully while you are receiving this medication. This medication may increase the side effects of 5-fluorouracil . Tell your care team if you have diarrhea or mouth sores that do not get better or that get worse. What side effects may I notice from receiving this medication? Side effects that you should report to your care team as soon as possible: Allergic reactions--skin rash, itching, hives, swelling of the face, lips, tongue, or throat This list may not describe all possible side effects. Call your doctor for medical advice about side effects. You may report side effects to FDA at 1-800-FDA-1088. Where should I keep my medication? This medication is given in a hospital or clinic. It will not be stored at home. NOTE: This sheet is a summary. It may not cover all possible information. If you have questions about this medicine, talk to your doctor, pharmacist, or health care provider.  2024 Elsevier/Gold Standard (2021-12-29 00:00:00) Fluorouracil  Injection What is this medication? FLUOROURACIL  (flure oh YOOR a sil) treats some types of cancer. It works by slowing down the growth of cancer cells. This medicine may be used for other purposes; ask your health care provider or pharmacist if you have questions. COMMON BRAND NAME(S): Adrucil  What should I tell my care team before I take this medication? They need to know if you have any of these conditions: Blood disorders Dihydropyrimidine dehydrogenase (DPD) deficiency Infection, such as chickenpox, cold sores, herpes Kidney disease Liver disease Poor nutrition Recent or ongoing radiation therapy An unusual or allergic reaction to fluorouracil , other medications, foods, dyes, or preservatives If you or your partner are pregnant or trying to get  pregnant Breast-feeding How should I use this medication? This medication is injected into a vein. It is administered by your care team in a hospital or clinic setting. Talk to your care team about the use of this medication in children. Special care may be needed. Overdosage: If you think you have taken too much of this medicine contact a poison control center or emergency room at once. NOTE: This medicine is only for you. Do not share this medicine with others. What if I miss a dose? Keep appointments for follow-up doses. It is important not to miss your dose. Call your care team if you are unable to keep an appointment. What may interact with this medication? Do not take this medication with any of the following: Live virus vaccines This medication may also interact with the following: Medications that treat or prevent blood clots, such as warfarin, enoxaparin , dalteparin This list may not describe all possible interactions. Give your health care provider a list of all the medicines, herbs, non-prescription drugs, or dietary supplements you use. Also tell them if you smoke, drink alcohol,  or use illegal drugs. Some items may interact with your medicine. What should I watch for while using this medication? Your condition will be monitored carefully while you are receiving this medication. This medication may make you feel generally unwell. This is not uncommon as chemotherapy can affect healthy cells as well as cancer cells. Report any side effects. Continue your course of treatment even though you feel ill unless your care team tells you to stop. In some cases, you may be given additional medications to help with side effects. Follow all directions for their use. This medication may increase your risk of getting an infection. Call your care team for advice if you get a fever, chills, sore throat, or other symptoms of a cold or flu. Do not treat yourself. Try to avoid being around people who are  sick. This medication may increase your risk to bruise or bleed. Call your care team if you notice any unusual bleeding. Be careful brushing or flossing your teeth or using a toothpick because you may get an infection or bleed more easily. If you have any dental work done, tell your dentist you are receiving this medication. Avoid taking medications that contain aspirin, acetaminophen , ibuprofen , naproxen, or ketoprofen unless instructed by your care team. These medications may hide a fever. Do not treat diarrhea with over the counter products. Contact your care team if you have diarrhea that lasts more than 2 days or if it is severe and watery. This medication can make you more sensitive to the sun. Keep out of the sun. If you cannot avoid being in the sun, wear protective clothing and sunscreen. Do not use sun lamps, tanning beds, or tanning booths. Talk to your care team if you or your partner wish to become pregnant or think you might be pregnant. This medication can cause serious birth defects if taken during pregnancy and for 3 months after the last dose. A reliable form of contraception is recommended while taking this medication and for 3 months after the last dose. Talk to your care team about effective forms of contraception. Do not father a child while taking this medication and for 3 months after the last dose. Use a condom while having sex during this time period. Do not breastfeed while taking this medication. This medication may cause infertility. Talk to your care team if you are concerned about your fertility. What side effects may I notice from receiving this medication? Side effects that you should report to your care team as soon as possible: Allergic reactions--skin rash, itching, hives, swelling of the face, lips, tongue, or throat Heart attack--pain or tightness in the chest, shoulders, arms, or jaw, nausea, shortness of breath, cold or clammy skin, feeling faint or  lightheaded Heart failure--shortness of breath, swelling of the ankles, feet, or hands, sudden weight gain, unusual weakness or fatigue Heart rhythm changes--fast or irregular heartbeat, dizziness, feeling faint or lightheaded, chest pain, trouble breathing High ammonia level--unusual weakness or fatigue, confusion, loss of appetite, nausea, vomiting, seizures Infection--fever, chills, cough, sore throat, wounds that don't heal, pain or trouble when passing urine, general feeling of discomfort or being unwell Low red blood cell level--unusual weakness or fatigue, dizziness, headache, trouble breathing Pain, tingling, or numbness in the hands or feet, muscle weakness, change in vision, confusion or trouble speaking, loss of balance or coordination, trouble walking, seizures Redness, swelling, and blistering of the skin over hands and feet Severe or prolonged diarrhea Unusual bruising or bleeding Side effects that usually do  not require medical attention (report to your care team if they continue or are bothersome): Dry skin Headache Increased tears Nausea Pain, redness, or swelling with sores inside the mouth or throat Sensitivity to light Vomiting This list may not describe all possible side effects. Call your doctor for medical advice about side effects. You may report side effects to FDA at 1-800-FDA-1088. Where should I keep my medication? This medication is given in a hospital or clinic. It will not be stored at home. NOTE: This sheet is a summary. It may not cover all possible information. If you have questions about this medicine, talk to your doctor, pharmacist, or health care provider.  2024 Elsevier/Gold Standard (2021-12-01 00:00:00)

## 2024-02-15 NOTE — Progress Notes (Signed)
 Patient seen by Dr. Chinita Pasam today  Vitals are within treatment parameters:Yes   Labs are within treatment parameters: Yes   Treatment plan has been signed: Yes   Per physician team, Patient is ready for treatment. Please note the following modifications:   Per Dr. Autumn:  Hold 5FU Bolus today.

## 2024-02-15 NOTE — Assessment & Plan Note (Addendum)
 Please review oncology history for additional details and timeline of events.    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 MMR proficient.   Previously I discussed diagnosis, staging, prognosis, plan of care, treatment options.  Reviewed NCCN guidelines.  On 02/07/2024, staging CT of the chest showed no evidence of intrathoracic metastatic disease.  Chemotherapy is planned to target residual microscopic disease. FOLFOX regimen, consisting of fluorouracil  and oxaliplatin , will be administered biweekly for six months. Potential side effects include nausea, diarrhea, myelosuppression, mucositis, cold sensitivity, and neuropathy. The goal is curative, emphasizing chemotherapy's role in recurrence prevention. Treatment timing will be adjusted to accommodate her work schedule and personal commitments. Chemotherapy dosage may be modified based on side effects and hematologic parameters.   Labs today revealed hemoglobin of 9.1, white count and plate count are normal.  CMP unremarkable.  AST and ALT are now normal.  No evidence of liver disease noted on recent CT of the abdomen and pelvis.  Iron  studies were previously within normal limits.  Overall no dose-limiting toxicities.  However given persistent fatigue, we will skip 5-FU bolus and proceed with cycle 1 of FOLFOX today at standard dosing.  RTC in 2 weeks for cycle 2 of chemotherapy.  She was made aware of my absence in clinic around that time and she will be seen by one of our other providers.  -She was previously referred to genetic counseling for genetic testing.  - Monitor with circulating tumor DNA test and CT scans every three months for two years.  -We will schedule colonoscopy one year post-surgery.

## 2024-02-15 NOTE — Assessment & Plan Note (Signed)
 Rheumatoid arthritis managed with methotrexate. Methotrexate will be discontinued during chemotherapy so as to avoid myelotoxicity.  Also chemotherapy may alleviate RA symptoms. Potential resumption post-chemotherapy will depend on symptomatology. - Discontinue methotrexate during chemotherapy. - Coordinate with rheumatologist Dr. Ebbie Goldmann for RA management.

## 2024-02-15 NOTE — Assessment & Plan Note (Signed)
 Significant fatigue impacting work and daily activities. Likely multifactorial, related to anemia and recent cancer diagnosis/surgery. - We will skip 5-FU bolus with cycle 1 to make sure her fatigue does not worsen further.

## 2024-02-15 NOTE — Progress Notes (Signed)
 [facility]  ONCOLOGY CLINIC PROGRESS NOTE   Patient Care Team: Sun, Vyvyan, MD as PCP - General (Family Medicine) Dasie Leonor CROME, MD as Consulting Physician (General Surgery) Autumn Millman, MD as Consulting Physician (Oncology) Mai Lynwood FALCON, MD as Consulting Physician (Rheumatology)  PATIENT NAME: Sherry Abbott   MR#: [id number] DOB: 21-Jun-1976  Date of visit: 02/15/2024   ASSESSMENT & PLAN:   Sherry Abbott is a 48 y.o.  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  pT3, pN1a, cM0, stage III B disease.  MMR proficient.   Adenocarcinoma of cecum [facility]) Please review oncology history for additional details and timeline of events.    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 MMR proficient.   Previously I discussed diagnosis, staging, prognosis, plan of care, treatment options.  Reviewed NCCN guidelines.  On 02/07/2024, staging CT of the chest showed no evidence of intrathoracic metastatic disease.  Chemotherapy is planned to target residual microscopic disease. FOLFOX regimen, consisting of fluorouracil  and oxaliplatin , will be administered biweekly for six months. Potential side effects include nausea, diarrhea, myelosuppression, mucositis, cold sensitivity, and neuropathy. The goal is curative, emphasizing chemotherapy's role in recurrence prevention. Treatment timing will be adjusted to accommodate her work schedule and personal commitments. Chemotherapy dosage may be modified based on side effects and hematologic parameters.   Labs today revealed hemoglobin of 9.1, white count and plate count are normal.  CMP unremarkable.  AST and ALT are now normal.  No  evidence of liver disease noted on recent CT of the abdomen and pelvis.  Iron  studies were previously within normal limits.  Overall no dose-limiting toxicities.  However given persistent fatigue, we will skip 5-FU bolus and proceed with cycle 1 of FOLFOX today at standard dosing.  RTC in 2 weeks for cycle 2 of chemotherapy.  She was made aware of my absence in clinic around that time and she will be seen by one of our other providers.  -She was previously referred to genetic counseling for genetic testing.  - Monitor with circulating tumor DNA test and CT scans every three months for two years.  -We will schedule colonoscopy one year post-surgery.   Rheumatoid arthritis (HCC) Rheumatoid arthritis managed with methotrexate. Methotrexate will be discontinued during chemotherapy so as to avoid myelotoxicity.  Also chemotherapy may alleviate RA symptoms. Potential resumption post-chemotherapy will depend on symptomatology. - Discontinue methotrexate during chemotherapy. - Coordinate with rheumatologist Dr. Mai for RA management.  Other fatigue Significant fatigue impacting work and daily activities. Likely multifactorial, related to anemia and recent cancer diagnosis/surgery. - We will skip 5-FU bolus with cycle 1 to make sure her fatigue does not worsen further.  Normocytic anemia Anemia with hemoglobin level of 9.1, contributing to fatigue. Likely related to cancer diagnosis and treatment. Goal to maintain hemoglobin above 8 during chemotherapy to avoid transfusions. - Monitor hemoglobin levels and consider transfusion if levels drop below 8 during chemotherapy. - Continue dietary modifications and iron  supplementation as discussed with the nutritionist.    I reviewed lab results and outside records for this visit and discussed relevant results with the patient. Diagnosis, plan of care and treatment options were also discussed in detail with the patient. Opportunity provided to ask  questions and  answers provided to her apparent satisfaction. Provided instructions to call our clinic with any problems, questions or concerns prior to return visit. I recommended to continue follow-up with PCP and sub-specialists. She verbalized understanding and agreed with the plan.   NCCN guidelines have been consulted in the planning of this patient's care.  I spent a total of 30 minutes during this encounter with the patient including review of chart and various tests results, discussions about plan of care and coordination of care plan.   Chinita Patten, MD  02/15/2024 10:51 AM  [facility] - A DEPT OF [facility] [address] [place] [address] Dept: [phone] Dept Fax: [phone]    CHIEF COMPLAINT/ REASON FOR VISIT:   Invasive adenocarcinoma of the cecum, status post hemicolectomy on 01/19/2024.  pT3, pN1a, cM0, stage III B disease.  MMR proficient.   Current Treatment: Adjuvant systemic chemotherapy with FOLFOX started from 02/15/2024.  INTERVAL HISTORY:    Discussed the use of AI scribe software for clinical note transcription with the patient, who gave verbal consent to proceed.  History of Present Illness Sherry Abbott is a 48 year old female with cancer who presents for chemotherapy treatment.  She is undergoing chemotherapy and recently had a port placed. She is here for her chemotherapy session, with the dosage to be determined after reviewing her lab results.  Recent blood work shows normal white blood cell count and platelets, but her hemoglobin remains low at 9.1. She has been taking iron  supplements and consuming more calorie-rich and protein-rich foods but is struggling to increase her hemoglobin levels. She follows a pescetarian diet, primarily plant-based, and is currently on a low fiber diet, which restricts her intake of leafy greens and beans. She finds it challenging to maintain  her hemoglobin levels under these dietary restrictions.  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 She describes herself as a 'high performing person' and notes that she is not at her usual energy levels. Despite this, she feels better than when she first came in on the eighteenth, although she still gets tired easily.  Her iron  levels were previously checked and were not low. No known blood loss. Her kidney and liver function tests are normal, allowing her to proceed with treatment.  She is scheduled to see a nurse practitioner, Lacie Burton, while her regular provider is out of the office on next visit. She has been in contact with a nutritionist and a Child psychotherapist to help manage her condition and dietary needs.     I have reviewed the past medical history, past surgical history, social history and family history with the patient and they are unchanged from previous note.  HISTORY OF PRESENT ILLNESS:   ONCOLOGY HISTORY:   Patient had routine screening colonoscopy on 01/12/2024, performed by Dr. Saintclair.  It showed a fungating, infiltrative, sessile, polypoid and ulcerated nonobstructing large mass in the cecum and in the ascending colon.  The mass was partially circumferential involving two thirds of the lumen circumference, measuring at least 7 cm in length.  Biopsies were obtained.  3 sessile polyps were found in the transverse colon, descending colon and sigmoid colon measuring up to 6 mm, removed.  Pathology from the cecal mass came back positive for invasive moderately differentiated adenocarcinoma.  MMR proficient.   On 01/16/2024, CT abdomen and pelvis showed 2.8  cm cyst in the left hepatic lobe, simple appearing.  No pathologic lymphadenopathy or other acute findings were noted.   She was referred for surgical oncology evaluation.  She was supposed to see Dr. Ann on 01/17/2024 but presented to the ED  that same day with complaints of worsening right lower quadrant abdominal pain, nausea, vomiting and inability to tolerate oral intake.  She was admitted to the hospital for further evaluation and management.   She was seen by Dr. Dasie in the hospital and she underwent laparoscopic-assisted right hemicolectomy, excision of peritoneal cyst on 01/19/2024.  Final pathology showed invasive moderately differentiated adenocarcinoma with focal mucinous differentiation arising within a tubulovillous adenoma, measuring 6.7 cm in greatest dimension with involvement of submucosa, muscularis propria and into pericolonic adipose tissue.  1 out of 26 lymph nodes were positive for malignancy.  There was another single positive tumor deposit.  All margins were negative.  Grade 2.  No evidence of LVI or PNI.  MMR proficient.     On her consultation with us  on 01/25/2024, request placed for CT of the chest for staging.  On 02/07/2024, staging CT of the chest showed no evidence of intrathoracic metastatic disease.  pT3,pN1a, cM0, Stage III B disease.    Plan for adjuvant chemotherapy with modified FOLFOX-6, every 2 weeks for up to 12 cycles.  Started cycle 1 on 02/15/2024.  Oncology History  Adenocarcinoma of cecum (HCC)  01/17/2024 Initial Diagnosis   Adenocarcinoma of cecum (HCC)   01/25/2024 Cancer Staging   Staging form: Colon and Rectum, AJCC 8th Edition - Pathologic: Stage IIIB (pT3, pN1a, cM0) - Signed by Autumn Millman, MD on 02/15/2024 Total positive nodes: 1 Histologic grading system: 4 grade system Histologic grade (G): G2 Residual tumor (R): R0   02/15/2024 -  Chemotherapy   Patient is on Treatment Plan : COLORECTAL FOLFOX q14d x 3 months         REVIEW OF SYSTEMS:   Review of Systems - Oncology  All other pertinent systems were reviewed with the patient and are negative.  ALLERGIES: She is allergic to betadine [povidone iodine], blueberry [vaccinium angustifolium], milk-related compounds,  neosporin [neomycin-bacitracin zn-polymyx], shellfish allergy, and sulfa antibiotics.  MEDICATIONS:  Current Outpatient Medications  Medication Sig Dispense Refill   B Complex Vitamins (VITAMIN B-COMPLEX) TABS Take 1 tablet by mouth daily at 6 (six) AM.     cetirizine (ZYRTEC ALLERGY) 10 MG tablet Take 10 mg by mouth daily.     dexamethasone  (DECADRON ) 4 MG tablet Take 2 tablets (8 mg total) by mouth daily. Start the day after chemotherapy for 2 days. Take with food. 30 tablet 1   docusate sodium  (COLACE) 100 MG capsule Take 2 capsules (200 mg total) by mouth 2 (two) times daily as needed for mild constipation. 25 capsule 0   Fluocinolone Acetonide Scalp 0.01 % OIL Apply 1 application  topically once a week. Apply to scalp and neck     fluticasone (FLONASE) 50 MCG/ACT nasal spray Place 2 sprays into both nostrils daily as needed.     folic acid (FOLVITE) 1 MG tablet Take 1 mg by mouth daily.     lidocaine -prilocaine  (EMLA ) cream Apply to affected area once 30 g 3   mometasone (NASONEX) 50 MCG/ACT nasal spray Place 2 sprays into the nose daily as needed (Allergies).     montelukast (SINGULAIR) 10 MG tablet Take 10 mg by mouth at bedtime.     Multiple Vitamin (MULTIVITAMIN WITH MINERALS) TABS tablet Take 1 tablet  by mouth daily.     ondansetron  (ZOFRAN ) 4 MG tablet Take 1 tablet (4 mg total) by mouth every 8 (eight) hours as needed for nausea or vomiting. 10 tablet 0   ondansetron  (ZOFRAN ) 8 MG tablet Take 1 tablet (8 mg total) by mouth every 8 (eight) hours as needed for nausea or vomiting. Start on the third day after chemotherapy. 30 tablet 1   oxyCODONE  (OXY IR/ROXICODONE ) 5 MG immediate release tablet Take 1 tablet (5 mg total) by mouth every 8 (eight) hours as needed for severe pain (pain score 7-10) (5 mg for pain score 4-6, 10 mg for pain score 7-10). 12 tablet 0   pantoprazole  (PROTONIX ) 20 MG tablet Take 20 mg by mouth 2 (two) times daily. (Patient taking differently: Take 20 mg by mouth 2  (two) times daily. Taking as needed)     polyethylene glycol powder (GLYCOLAX /MIRALAX ) 17 GM/SCOOP powder Take 17 g by mouth daily as needed. 238 g 0   prochlorperazine  (COMPAZINE ) 10 MG tablet Take 1 tablet (10 mg total) by mouth every 6 (six) hours as needed for nausea or vomiting. 30 tablet 1   methotrexate (RHEUMATREX) 2.5 MG tablet Take 15 mg by mouth every Friday. 6 tablets (Patient not taking: Reported on 02/15/2024)     Current Facility-Administered Medications  Medication Dose Route Frequency Provider Last Rate Last Admin   Fremanezumab -vfrm SOSY 225 mg  225 mg Subcutaneous Once Ahern, Antonia B, MD       Facility-Administered Medications Ordered in Other Visits  Medication Dose Route Frequency Provider Last Rate Last Admin   dextrose  5 % solution   Intravenous Continuous Prithvi Kooi, MD 10 mL/hr at 02/15/24 1020 New Bag at 02/15/24 1020   fluorouracil  (ADRUCIL ) 4,200 mg in sodium chloride  0.9 % 66 mL chemo infusion  2,400 mg/m2 (Treatment Plan Recorded) Intravenous 1 day or 1 dose Aneri Slagel, MD       heparin  lock flush 100 unit/mL  500 Units Intracatheter Once PRN Deyja Sochacki, MD       leucovorin  700 mg in dextrose  5 % 250 mL infusion  400 mg/m2 (Treatment Plan Recorded) Intravenous Once Teddrick Mallari, MD       oxaliplatin  (ELOXATIN ) 150 mg in dextrose  5 % 500 mL chemo infusion  85 mg/m2 (Treatment Plan Recorded) Intravenous Once Allante Whitmire, MD       sodium chloride  flush (NS) 0.9 % injection 10 mL  10 mL Intracatheter PRN Kinberly Perris, MD         VITALS:   Blood pressure 105/73, pulse 78, temperature 97.9 F (36.6 C), temperature source Temporal, resp. rate 17, height 5' 5 (1.651 m), weight 149 lb (67.6 kg), last menstrual period 12/26/2023, SpO2 100%.  Wt Readings from Last 3 Encounters:  02/15/24 149 lb (67.6 kg)  02/03/24 144 lb (65.3 kg)  02/01/24 146 lb 4.8 oz (66.4 kg)    Body mass index is 24.79 kg/m.    Onc Performance Status - 02/15/24 0939        ECOG Perf Status   ECOG Perf Status Ambulatory and capable of all selfcare but unable to carry out any work activities.  Up and about more than 50% of waking hours      KPS SCALE   KPS % SCORE Cares for self, unable to carry on normal activity or to do active work          PHYSICAL EXAM:   Physical Exam Constitutional:      General: She is  not in acute distress.    Appearance: Normal appearance.  HENT:     Head: Normocephalic and atraumatic.  Cardiovascular:     Rate and Rhythm: Normal rate.  Pulmonary:     Effort: Pulmonary effort is normal. No respiratory distress.  Chest:     Comments: Right-sided Port-A-Cath in place without any signs of infection Abdominal:     General: There is no distension.  Neurological:     General: No focal deficit present.     Mental Status: She is alert and oriented to person, place, and time.  Psychiatric:        Mood and Affect: Mood normal.        Behavior: Behavior normal.       LABORATORY DATA:   I have reviewed the data as listed.  Results for orders placed or performed in visit on 02/15/24  CMP (Cancer Center only)  Result Value Ref Range   Sodium 140 135 - 145 mmol/L   Potassium 3.8 3.5 - 5.1 mmol/L   Chloride 106 98 - 111 mmol/L   CO2 28 22 - 32 mmol/L   Glucose, Bld 87 70 - 99 mg/dL   BUN 14 6 - 20 mg/dL   Creatinine 9.31 9.55 - 1.00 mg/dL   Calcium  9.1 8.9 - 10.3 mg/dL   Total Protein 7.0 6.5 - 8.1 g/dL   Albumin  4.0 3.5 - 5.0 g/dL   AST 18 15 - 41 U/L   ALT 15 0 - 44 U/L   Alkaline Phosphatase 42 38 - 126 U/L   Total Bilirubin 0.3 0.0 - 1.2 mg/dL   GFR, Estimated >39 >39 mL/min   Anion gap 6 5 - 15  CBC with Differential (Cancer Center Only)  Result Value Ref Range   WBC Count 4.0 4.0 - 10.5 K/uL   RBC 3.19 (L) 3.87 - 5.11 MIL/uL   Hemoglobin 9.1 (L) 12.0 - 15.0 g/dL   HCT 73.2 (L) 63.9 - 53.9 %   MCV 83.7 80.0 - 100.0 fL   MCH 28.5 26.0 - 34.0 pg   MCHC 34.1 30.0 - 36.0 g/dL   RDW 84.0 (H) 88.4 - 84.4 %    Platelet Count 212 150 - 400 K/uL   nRBC 0.0 0.0 - 0.2 %   Neutrophils Relative % 56 %   Neutro Abs 2.3 1.7 - 7.7 K/uL   Lymphocytes Relative 33 %   Lymphs Abs 1.3 0.7 - 4.0 K/uL   Monocytes Relative 6 %   Monocytes Absolute 0.3 0.1 - 1.0 K/uL   Eosinophils Relative 4 %   Eosinophils Absolute 0.2 0.0 - 0.5 K/uL   Basophils Relative 1 %   Basophils Absolute 0.0 0.0 - 0.1 K/uL   Immature Granulocytes 0 %   Abs Immature Granulocytes 0.01 0.00 - 0.07 K/uL      RADIOGRAPHIC STUDIES:  I have personally reviewed the radiological images as listed and agree with the findings in the report.  CT Chest W Contrast Result Date: 02/09/2024 CLINICAL DATA:  Staging adenocarcinoma of the cecum. * Tracking Code: BO * EXAM: CT CHEST WITH CONTRAST TECHNIQUE: Multidetector CT imaging of the chest was performed during intravenous contrast administration. RADIATION DOSE REDUCTION: This exam was performed according to the departmental dose-optimization program which includes automated exposure control, adjustment of the mA and/or kV according to patient size and/or use of iterative reconstruction technique. CONTRAST:  75mL OMNIPAQUE  IOHEXOL  300 MG/ML  SOLN COMPARISON:  CTA chest 06/12/2008.  Abdomen pelvis CT 01/16/2024. FINDINGS:  Cardiovascular: Right IJ chest port in place with tip extending to the SVC right atrial junction region. Mild soft tissue stranding adjacent to the port. This could be postprocedural. Heart is nonenlarged. No pericardial effusion. The thoracic aorta has a normal course and caliber. Slight pulsation artifact along the ascending aorta. Mediastinum/Nodes: No specific abnormal lymph node enlargement identified in the axillary regions, hilum or mediastinum. Preserved thyroid gland. Normal caliber thoracic esophagus. Lungs/Pleura: There is some breathing motion. No consolidation, pneumothorax or effusion. Calcified left lower lobe lung nodule identified on image 109 of series 6 consistent with  old granulomatous disease. There is a noncalcified solid lobular nodule in the posterior right lower lobe measuring 6 mm on series 6, image 96. Of interest distant was present in 2009 consistent with long-term stability and a benign nodule. Upper Abdomen: Adrenal glands are preserved in the upper abdomen. Please correlate with recent CT scan of the abdomen and pelvis. Musculoskeletal: Slight curvature of the spine. Mild degenerative changes. IMPRESSION: No CT evidence of metastatic disease to the thorax. No lymph node enlargement, new lung mass or pleural effusion. Chest port. Electronically Signed   By: Ranell Bring M.D.   On: 02/09/2024 10:47   DG C-Arm 1-60 Min Result Date: 02/03/2024 CLINICAL DATA:  Surgery. EXAM: DG C-ARM 1-60 MIN FLUOROSCOPY: Fluoroscopy Time:  9.2 seconds Radiation Exposure Index (if provided by the fluoroscopic device): 1.27 mGy Number of Acquired Spot Images: 1 COMPARISON:  None Available. FINDINGS: Single fluoroscopic spot view submitted from the operating room. Right chest port is in place. IMPRESSION: Fluoroscopic spot view post chest port placement. Electronically Signed   By: Andrea Gasman M.D.   On: 02/03/2024 16:39   DG CHEST PORT 1 VIEW Result Date: 02/03/2024 CLINICAL DATA:  [id number] Port-A-Cath in place [id number]. EXAM: PORTABLE CHEST 1 VIEW COMPARISON:  01/16/2024. FINDINGS: Low lung volume. Mild diffuse pulmonary vascular congestion, likely accentuated by low lung volume. No frank pulmonary edema. Bilateral lung fields are otherwise clear. No dense consolidation or lung collapse. Bilateral costophrenic angles are clear. Normal cardio-mediastinal silhouette. No acute osseous abnormalities. The soft tissues are within normal limits. Right-sided CT Port-A-Cath is seen with its tip overlying the cavoatrial junction region. No pneumothorax. IMPRESSION: *Mild diffuse pulmonary vascular congestion, likely accentuated by low lung volume. No frank pulmonary edema. *Right-sided CT  Port-A-Cath is seen with its tip overlying the cavoatrial junction region. No pneumothorax. Electronically Signed   By: Ree Molt M.D.   On: 02/03/2024 13:21   DG Abd Portable 1V Result Date: 01/23/2024 CLINICAL DATA:  Constipation EXAM: PORTABLE ABDOMEN - 1 VIEW COMPARISON:  None Available. FINDINGS: The bowel gas pattern is normal. No radio-opaque calculi or other significant radiographic abnormality are seen. IMPRESSION: Negative.  No evidence of constipation Electronically Signed   By: Franky Chard M.D.   On: 01/23/2024 08:33   US  PELVIC COMPLETE W TRANSVAGINAL AND TORSION R/O Result Date: 01/16/2024 CLINICAL DATA:  Pelvic pain EXAM: TRANSABDOMINAL AND TRANSVAGINAL ULTRASOUND OF PELVIS DOPPLER ULTRASOUND OF OVARIES TECHNIQUE: Both transabdominal and transvaginal ultrasound examinations of the pelvis were performed. Transabdominal technique was performed for global imaging of the pelvis including uterus, ovaries, adnexal regions, and pelvic cul-de-sac. It was necessary to proceed with endovaginal exam following the transabdominal exam to visualize the ovaries. Color and duplex Doppler ultrasound was utilized to evaluate blood flow to the ovaries. COMPARISON:  CT from earlier in the same day. FINDINGS: Uterus Measurements: 8.9 x 3.5 x 4.3 cm. = volume: 69 mL. No fibroids or  other mass visualized. Endometrium Thickness: 4 mm.  No focal abnormality visualized. Right ovary Measurements: 0.3 x 2.9 x 4.2 cm. = volume: 21 mL. Normal appearance/no adnexal mass. Left ovary Measurements: 2.5 x 1.5 x 2.1 cm. = volume: 4.2 mL. Normal appearance/no adnexal mass. Pulsed Doppler evaluation of both ovaries demonstrates normal low-resistance arterial and venous waveforms. Other findings Trace free fluid is noted within the pelvis likely physiologic in nature. IMPRESSION: No acute abnormality noted. Electronically Signed   By: Oneil Devonshire M.D.   On: 01/16/2024 23:59   DG Chest Portable 1 View Result Date:  01/16/2024 CLINICAL DATA:  Sepsis EXAM: PORTABLE CHEST 1 VIEW COMPARISON:  CT 06/12/2008 FINDINGS: The heart size and mediastinal contours are within normal limits. Both lungs are clear. The visualized skeletal structures are unremarkable. IMPRESSION: No active disease. Electronically Signed   By: Franky Crease M.D.   On: 01/16/2024 20:58   CT ABDOMEN PELVIS W CONTRAST Result Date: 01/16/2024 CLINICAL DATA:  Abdominal pain, acute, nonlocalized EXAM: CT ABDOMEN AND PELVIS WITH CONTRAST TECHNIQUE: Multidetector CT imaging of the abdomen and pelvis was performed using the standard protocol following bolus administration of intravenous contrast. RADIATION DOSE REDUCTION: This exam was performed according to the departmental dose-optimization program which includes automated exposure control, adjustment of the mA and/or kV according to patient size and/or use of iterative reconstruction technique. CONTRAST:  75mL OMNIPAQUE  IOHEXOL  350 MG/ML SOLN COMPARISON:  None Available. FINDINGS: Lower chest: No acute abnormality Hepatobiliary: 2.8 cm cyst in the left hepatic lobe appears simple. No suspicious focal hepatic abnormality. Gallbladder unremarkable. Pancreas: No focal abnormality or ductal dilatation. Spleen: No focal abnormality.  Normal size. Adrenals/Urinary Tract: No adrenal abnormality. No focal renal abnormality. No stones or hydronephrosis. Urinary bladder is unremarkable. Stomach/Bowel: Multiple appendicoliths within the appendix. No appendiceal dilatation or surrounding inflammation. Stomach, large and small bowel grossly unremarkable. Vascular/Lymphatic: No evidence of aneurysm or adenopathy. Reproductive: Uterus and adnexa unremarkable.  No mass. Other: No free fluid or free air. Rim calcified structure in the right lower quadrant measures 2.5 x 1.3 cm, likely calcified lymph node. Musculoskeletal: No acute bony abnormality. IMPRESSION: No acute findings in the abdomen or pelvis. Electronically Signed   By:  Franky Crease M.D.   On: 01/16/2024 20:02    CODE STATUS:  Code Status History     Date Active Date Inactive Code Status Order ID Comments User Context   01/17/2024 0459 01/23/2024 1727 Full Code [id number]  Alfornia Madison, MD ED    Questions for Most Recent Historical Code Status (Order [id number])     Question Answer   By: Consent: discussion documented in EHR            No orders of the defined types were placed in this encounter.    Future Appointments  Date Time Provider Department Center  02/17/2024  1:45 PM [facility] FLUSH [facility] None  02/27/2024 10:30 AM [facility]USH [facility] None  02/27/2024 11:00 AM Ann Mayme POUR, NP [facility] None  02/27/2024 12:00 PM [facility] INFUSION [facility] None  02/27/2024  1:45 PM Dasie Simple, RD [facility] None  02/29/2024  1:00 PM [facility] FLUSH [facility] None  04/16/2024  9:00 AM Woodard Burnard BROCKS, Counselor [facility] None  04/16/2024 10:15 AM [facility] LAB [facility] None      This document was completed utilizing speech recognition software. Grammatical errors, random word insertions, pronoun errors, and incomplete sentences are an occasional consequence of this system due to software limitations, ambient noise, and hardware issues.  Any formal questions or concerns about the content, text or information contained within the body of this dictation should be directly addressed to the provider for clarification.

## 2024-02-15 NOTE — Progress Notes (Signed)
.  [facility] CSW Progress Note  Visual merchandiser met with patient to follow-up on adjustment to diagnosis and treatment. Patient reported late night / early morning anxiety within expected range of beginning treatment. Patient believes mood and anxiousness will level out as she gets adjusted to the appointments.     Interventions: CSW described expected emotions and thoughts for patients diagnosed with cancer.    Follow Up Plan:  Patient will contact CSW with any support or resource needs   Lizbeth Sprague, LCSW Clinical Social Worker [facility]

## 2024-02-16 ENCOUNTER — Encounter (HOSPITAL_COMMUNITY): Payer: Self-pay | Admitting: Emergency Medicine

## 2024-02-16 ENCOUNTER — Other Ambulatory Visit: Payer: Self-pay

## 2024-02-16 ENCOUNTER — Emergency Department (HOSPITAL_COMMUNITY)
Admission: EM | Admit: 2024-02-16 | Discharge: 2024-02-16 | Disposition: A | Discharge status: Home / Self Care | Attending: Emergency Medicine | Admitting: Emergency Medicine

## 2024-02-16 ENCOUNTER — Telehealth: Payer: Self-pay

## 2024-02-16 DIAGNOSIS — D649 Anemia, unspecified: Secondary | ICD-10-CM | POA: Diagnosis not present

## 2024-02-16 DIAGNOSIS — T451X5A Adverse effect of antineoplastic and immunosuppressive drugs, initial encounter: Secondary | ICD-10-CM | POA: Insufficient documentation

## 2024-02-16 DIAGNOSIS — K148 Other diseases of tongue: Secondary | ICD-10-CM | POA: Insufficient documentation

## 2024-02-16 DIAGNOSIS — R6884 Jaw pain: Secondary | ICD-10-CM | POA: Insufficient documentation

## 2024-02-16 DIAGNOSIS — K0889 Other specified disorders of teeth and supporting structures: Secondary | ICD-10-CM | POA: Insufficient documentation

## 2024-02-16 DIAGNOSIS — T50905A Adverse effect of unspecified drugs, medicaments and biological substances, initial encounter: Secondary | ICD-10-CM

## 2024-02-16 LAB — COMPREHENSIVE METABOLIC PANEL WITH GFR
ALT: 19 U/L (ref 0–44)
AST: 24 U/L (ref 15–41)
Albumin: 3.8 g/dL (ref 3.5–5.0)
Alkaline Phosphatase: 38 U/L (ref 38–126)
Anion gap: 10 (ref 5–15)
BUN: 12 mg/dL (ref 6–20)
CO2: 21 mmol/L — ABNORMAL LOW (ref 22–32)
Calcium: 9.1 mg/dL (ref 8.9–10.3)
Chloride: 105 mmol/L (ref 98–111)
Creatinine, Ser: 0.79 mg/dL (ref 0.44–1.00)
GFR, Estimated: >60 mL/min (ref >60)
Glucose, Bld: 150 mg/dL — ABNORMAL HIGH (ref 70–99)
Potassium: 3.6 mmol/L (ref 3.5–5.1)
Sodium: 136 mmol/L (ref 135–145)
Total Bilirubin: 0.6 mg/dL (ref 0.0–1.2)
Total Protein: 7.4 g/dL (ref 6.5–8.1)

## 2024-02-16 LAB — CBC WITH DIFFERENTIAL/PLATELET
Abs Immature Granulocytes: 0.02 K/uL (ref 0.00–0.07)
Basophils Absolute: 0 K/uL (ref 0.0–0.1)
Basophils Relative: 0 %
Eosinophils Absolute: 0 K/uL (ref 0.0–0.5)
Eosinophils Relative: 0 %
HCT: 28 % — ABNORMAL LOW (ref 36.0–46.0)
Hemoglobin: 9.4 g/dL — ABNORMAL LOW (ref 12.0–15.0)
Immature Granulocytes: 0 %
Lymphocytes Relative: 8 %
Lymphs Abs: 0.6 K/uL — ABNORMAL LOW (ref 0.7–4.0)
MCH: 28.4 pg (ref 26.0–34.0)
MCHC: 33.6 g/dL (ref 30.0–36.0)
MCV: 84.6 fL (ref 80.0–100.0)
Monocytes Absolute: 0.1 K/uL (ref 0.1–1.0)
Monocytes Relative: 1 %
Neutro Abs: 7 K/uL (ref 1.7–7.7)
Neutrophils Relative %: 91 %
Platelets: 226 K/uL (ref 150–400)
RBC: 3.31 MIL/uL — ABNORMAL LOW (ref 3.87–5.11)
RDW: 15.9 % — ABNORMAL HIGH (ref 11.5–15.5)
WBC: 7.7 K/uL (ref 4.0–10.5)
nRBC: 0 % (ref 0.0–0.2)

## 2024-02-16 MED ORDER — FAMOTIDINE 20 MG PO TABS
20.0000 mg | ORAL_TABLET | Freq: Two times a day (BID) | ORAL | 0 refills | Status: AC
Start: 2024-02-16 — End: ?

## 2024-02-16 MED ORDER — DIAZEPAM 5 MG/ML IJ SOLN
5.0000 mg | Freq: Once | INTRAMUSCULAR | Status: AC
  Administered 2024-02-16: 5 mg via INTRAVENOUS
  Filled 2024-02-16: qty 2

## 2024-02-16 MED ORDER — DIAZEPAM 5 MG PO TABS: 5.0000 mg | ORAL_TABLET | Freq: Two times a day (BID) | ORAL | 0 refills | Status: DC

## 2024-02-16 MED ORDER — HEPARIN SOD (PORK) LOCK FLUSH 100 UNIT/ML IV SOLN
500.0000 [IU] | INTRAVENOUS | Status: AC | PRN
  Administered 2024-02-16: 500 [IU]

## 2024-02-16 MED ORDER — FAMOTIDINE IN NACL 20-0.9 MG/50ML-% IV SOLN
20.0000 mg | Freq: Once | INTRAVENOUS | Status: AC
Start: 2024-02-16 — End: 2024-02-16
  Administered 2024-02-16: 20 mg via INTRAVENOUS
  Filled 2024-02-16: qty 50

## 2024-02-16 MED ORDER — DIPHENHYDRAMINE HCL 25 MG PO TABS
25.0000 mg | ORAL_TABLET | Freq: Four times a day (QID) | ORAL | 0 refills | Status: AC | PRN
Start: 2024-02-16 — End: ?

## 2024-02-16 MED ORDER — DIPHENHYDRAMINE HCL 50 MG/ML IJ SOLN
25.0000 mg | Freq: Once | INTRAMUSCULAR | Status: AC
  Administered 2024-02-16: 25 mg via INTRAVENOUS
  Filled 2024-02-16: qty 1

## 2024-02-16 MED ORDER — DOCUSATE SODIUM 100 MG PO CAPS: 200.0000 mg | ORAL_CAPSULE | Freq: Two times a day (BID) | ORAL | 0 refills | Status: DC

## 2024-02-16 MED ORDER — METHYLPREDNISOLONE SODIUM SUCC 125 MG IJ SOLR
125.0000 mg | Freq: Once | INTRAMUSCULAR | Status: AC
  Administered 2024-02-16: 125 mg via INTRAVENOUS
  Filled 2024-02-16: qty 2

## 2024-02-16 MED ORDER — PREDNISONE 10 MG PO TABS: 40.0000 mg | ORAL_TABLET | Freq: Every day | ORAL | 0 refills | Status: DC

## 2024-02-16 NOTE — Telephone Encounter (Signed)
 Sherry Abbott  states that she  has been tired.She has also been nauseated. She has used the compazine  with good effect. She is eating, drinking, and urinating well. She is working on navigating the cold sensitivity. Her fingers have been intermittently tingling/burning.She knows to call the office at [phone] if  she has any questions or concerns.

## 2024-02-16 NOTE — Discharge Instructions (Addendum)
 Today you were seen for a medication reaction.  Please pick up your medications and take as prescribed.  Please be careful while taking diazepam  and Benadryl  as these medications may make you drowsy.  Please follow-up with your oncologist as soon as possible for further evaluation and workup.  Please return to the ED if you have worsening symptoms, difficulty breathing, or mouth swelling.  Thank you for letting us  treat you today. After reviewing your labs, I feel you are safe to go home. Please follow up with your PCP in the next several days and provide them with your records from this visit. Return to the Emergency Room if pain becomes severe or symptoms worsen.

## 2024-02-16 NOTE — ED Notes (Signed)
 Pt has chemo pump that was placed in [facility].  Ozell, RN has stopped pump.  Port still accessed at this time. EDP to contact oncologist for further instructions.

## 2024-02-16 NOTE — Telephone Encounter (Signed)
 Returned a phone call to patient's husband who LVM that his wife was experiencing concerning symptoms and that he was taking her to the Emergency Dept. At [facility]. Upon reaching the husband, Lupita, he handed his phone to a PA named Kayla at the ED who described the patient as having tongue and/or jaw spasms. Dr. Autumn was made aware immediately and he called the PA directly.

## 2024-02-16 NOTE — ED Notes (Addendum)
IV team at bedside de-accessing port.  

## 2024-02-16 NOTE — Telephone Encounter (Signed)
-----   Message from Nurse Almarie POUR sent at 02/15/2024  2:55 PM EDT ----- Regarding: First time/ FOLFOX/ Dr Autumn Pt Pt had first time FOLFOX today, tolerated well.

## 2024-02-16 NOTE — ED Provider Notes (Signed)
 [place] EMERGENCY DEPARTMENT AT [facility] Provider Note   CSN: [id number] Arrival date & time: 02/16/24  1530     Patient presents with: Allergic Reaction   Sherry Abbott is a 48 y.o. female presents today for tongue cramping/spasm and right side of jaw clenching/spasm that began approximately an hour prior to arrival.  Patient started home chemo pump yesterday.  Patient denies chest pain, shortness of breath, fever, chills, difficulty breathing, or any other complaints.    Allergic Reaction      Prior to Admission medications   Medication Sig Start Date End Date Taking? Authorizing Provider  diazepam  (VALIUM ) 5 MG tablet Take 1 tablet (5 mg total) by mouth 2 (two) times daily. 02/16/24  Yes Chiquitta Matty N, PA-C  diphenhydrAMINE  (BENADRYL ) 25 MG tablet Take 1 tablet (25 mg total) by mouth every 6 (six) hours as needed. 02/16/24  Yes Francis Ileana SAILOR, PA-C  docusate sodium  (COLACE) 100 MG capsule Take 2 capsules (200 mg total) by mouth every 12 (twelve) hours. 02/16/24  Yes Johnedward Brodrick N, PA-C  famotidine  (PEPCID ) 20 MG tablet Take 1 tablet (20 mg total) by mouth 2 (two) times daily. 02/16/24  Yes Arley Garant N, PA-C  predniSONE  (DELTASONE ) 10 MG tablet Take 4 tablets (40 mg total) by mouth daily. 02/16/24  Yes Carlinda Ohlson N, PA-C  B Complex Vitamins (VITAMIN B-COMPLEX) TABS Take 1 tablet by mouth daily at 6 (six) AM. 06/23/20   [provider]  cetirizine (ZYRTEC ALLERGY) 10 MG tablet Take 10 mg by mouth daily.    [provider]  dexamethasone  (DECADRON ) 4 MG tablet Take 2 tablets (8 mg total) by mouth daily. Start the day after chemotherapy for 2 days. Take with food. 01/25/24   Pasam, Chinita, MD  docusate sodium  (COLACE) 100 MG capsule Take 2 capsules (200 mg total) by mouth 2 (two) times daily as needed for mild constipation. 01/23/24   Singh, Prashant K, MD  Fluocinolone Acetonide Scalp 0.01 % OIL Apply 1 application  topically once a week.  Apply to scalp and neck    [provider]  fluticasone (FLONASE) 50 MCG/ACT nasal spray Place 2 sprays into both nostrils daily as needed.    [provider]  folic acid (FOLVITE) 1 MG tablet Take 1 mg by mouth daily.    [provider]  lidocaine -prilocaine  (EMLA ) cream Apply to affected area once 01/25/24   Pasam, Avinash, MD  methotrexate (RHEUMATREX) 2.5 MG tablet Take 15 mg by mouth every Friday. 6 tablets Patient not taking: Reported on 02/15/2024 06/30/22   [provider]  mometasone (NASONEX) 50 MCG/ACT nasal spray Place 2 sprays into the nose daily as needed (Allergies).    [provider]  montelukast (SINGULAIR) 10 MG tablet Take 10 mg by mouth at bedtime. 06/11/20   [provider]  Multiple Vitamin (MULTIVITAMIN WITH MINERALS) TABS tablet Take 1 tablet by mouth daily.    [provider]  ondansetron  (ZOFRAN ) 4 MG tablet Take 1 tablet (4 mg total) by mouth every 8 (eight) hours as needed for nausea or vomiting. 01/23/24   Singh, Prashant K, MD  ondansetron  (ZOFRAN ) 8 MG tablet Take 1 tablet (8 mg total) by mouth every 8 (eight) hours as needed for nausea or vomiting. Start on the third day after chemotherapy. 01/25/24   Pasam, Chinita, MD  oxyCODONE  (OXY IR/ROXICODONE ) 5 MG immediate release tablet Take 1 tablet (5 mg total) by mouth every 8 (eight) hours as needed  for severe pain (pain score 7-10) (5 mg for pain score 4-6, 10 mg for pain score 7-10). 01/23/24   Singh, Prashant K, MD  pantoprazole  (PROTONIX ) 20 MG tablet Take 20 mg by mouth 2 (two) times daily. Patient taking differently: Take 20 mg by mouth 2 (two) times daily. Taking as needed 12/29/23   [provider]  polyethylene glycol powder (GLYCOLAX /MIRALAX ) 17 GM/SCOOP powder Take 17 g by mouth daily as needed. 01/23/24   Singh, Prashant K, MD  prochlorperazine  (COMPAZINE ) 10 MG tablet Take 1 tablet (10 mg total) by mouth every 6 (six) hours as needed for nausea or  vomiting. 01/25/24   Pasam, Chinita, MD    Allergies: Betadine [povidone iodine], Blueberry [vaccinium angustifolium], Milk-related compounds, Neosporin [neomycin-bacitracin zn-polymyx], Shellfish allergy, and Sulfa antibiotics    Review of Systems  Musculoskeletal:        Right jaw pain and tongue spasm    Updated Vital Signs BP 111/67   Pulse 76   Temp 99.7 F (37.6 C)   Resp 16   LMP 12/26/2023 (Approximate) Comment: DOS UPREG NEGATIVE  SpO2 100%   Physical Exam Vitals and nursing note reviewed.  Constitutional:      General: She is not in acute distress.    Appearance: Normal appearance. She is well-developed.  HENT:     Head: Normocephalic and atraumatic.     Mouth/Throat:     Mouth: Mucous membranes are moist.     Pharynx: Oropharynx is clear.     Comments: Patient with mild difficulty opening jaw due to right sided jaw pain, tongue spasm into a U-shaped on exam Eyes:     Conjunctiva/sclera: Conjunctivae normal.  Cardiovascular:     Rate and Rhythm: Normal rate and regular rhythm.     Pulses: Normal pulses.     Heart sounds: Normal heart sounds. No murmur heard. Pulmonary:     Effort: Pulmonary effort is normal. No respiratory distress.     Breath sounds: Normal breath sounds. No wheezing.  Abdominal:     Palpations: Abdomen is soft.     Tenderness: There is no abdominal tenderness.  Musculoskeletal:        General: No swelling.     Cervical back: Normal range of motion and neck supple.  Skin:    General: Skin is warm and dry.     Capillary Refill: Capillary refill takes less than 2 seconds.  Neurological:     General: No focal deficit present.     Mental Status: She is alert and oriented to person, place, and time.  Psychiatric:        Mood and Affect: Mood normal.     (all labs ordered are listed, but only abnormal results are displayed) Labs Reviewed  CBC WITH DIFFERENTIAL/PLATELET - Abnormal; Notable for the following components:      Result Value    RBC 3.31 (*)    Hemoglobin 9.4 (*)    HCT 28.0 (*)    RDW 15.9 (*)    Lymphs Abs 0.6 (*)    All other components within normal limits  COMPREHENSIVE METABOLIC PANEL WITH GFR - Abnormal; Notable for the following components:   CO2 21 (*)    Glucose, Bld 150 (*)    All other components within normal limits    EKG: None  Radiology: No results found.   Procedures   Medications Ordered in the ED  diphenhydrAMINE  (BENADRYL ) injection 25 mg (25 mg Intravenous Given 02/16/24 1627)  methylPREDNISolone  sodium succinate (SOLU-MEDROL )  125 mg/2 mL injection 125 mg (125 mg Intravenous Given 02/16/24 1626)  famotidine  (PEPCID ) IVPB 20 mg premix (0 mg Intravenous Stopped 02/16/24 1714)  diazepam  (VALIUM ) injection 5 mg (5 mg Intravenous Given 02/16/24 1628)  heparin  lock flush 100 unit/mL (500 Units Intracatheter Given 02/16/24 1645)                                    Medical Decision Making  This patient presents to the ED for concern of right jaw pain and dental pain differential diagnosis includes anaphylaxis, medication reaction, muscle spasm   Additional history obtained   Additional history obtained from Electronic Medical Record External records from outside source obtained and reviewed including oncology notes   Lab Tests:  I Ordered, and personally interpreted labs.  The pertinent results include: Anemia at 9.4 which is chronic per historical values, mildly decreased CO2 at 21   Medicines ordered and prescription drug management:  I ordered medication including Solu-Medrol , Benadryl , Pepcid , Valium  I have reviewed the patients home medicines and have made adjustments as needed   Problem List / ED Course:  Consulted, Dr. Autumn with oncology who recommended the accessing her port, giving the patient Pepcid , Benadryl , Solu-Medrol , and muscle relaxer while in ED and early outpatient and to follow-up in clinic tomorrow. Reassessment of patient after medications and patient  states that her symptoms are already improving, patient's speech has improved drastically. Considered for admission or further workup however patient's vital signs, physical exam and labs are reassuring.  Patient given outpatient course of Benadryl , Pepcid , prednisone , and Robaxin  for symptomatic management and to follow-up with her oncologist tomorrow at a scheduled appointment.  Patient also requesting refill of Colace for mild constipation.  Patient given return precautions.  I feel patient is safe for discharge at this time.        Final diagnoses:  Adverse effect of drug, initial encounter    ED Discharge Orders          Ordered    predniSONE  (DELTASONE ) 10 MG tablet  Daily        02/16/24 1804    famotidine  (PEPCID ) 20 MG tablet  2 times daily        02/16/24 1804    diphenhydrAMINE  (BENADRYL ) 25 MG tablet  Every 6 hours PRN        02/16/24 1804    docusate sodium  (COLACE) 100 MG capsule  Every 12 hours        02/16/24 1804    diazepam  (VALIUM ) 5 MG tablet  2 times daily        02/16/24 1804               Francis Ileana SAILOR, PA-C 02/16/24 1804    Ruthe Cornet, DO 02/16/24 1816

## 2024-02-16 NOTE — ED Triage Notes (Signed)
 Pt reports starting home chemo pump yesterday. Pt reports that today her tongue began to swell and mouth started clinching. Infusion pump stopped at this time in triage. PA notified.

## 2024-02-17 ENCOUNTER — Inpatient Hospital Stay (HOSPITAL_BASED_OUTPATIENT_CLINIC_OR_DEPARTMENT_OTHER): Admitting: Oncology

## 2024-02-17 ENCOUNTER — Inpatient Hospital Stay (HOSPITAL_BASED_OUTPATIENT_CLINIC_OR_DEPARTMENT_OTHER)

## 2024-02-17 ENCOUNTER — Encounter: Payer: Self-pay | Admitting: Oncology

## 2024-02-17 VITALS — BP 108/72 | HR 73 | Temp 99.6°F | Resp 17

## 2024-02-17 DIAGNOSIS — C180 Malignant neoplasm of cecum: Secondary | ICD-10-CM

## 2024-02-17 DIAGNOSIS — T451X5S Adverse effect of antineoplastic and immunosuppressive drugs, sequela: Secondary | ICD-10-CM

## 2024-02-17 MED ORDER — HEPARIN SOD (PORK) LOCK FLUSH 100 UNIT/ML IV SOLN
500.0000 [IU] | Freq: Once | INTRAVENOUS | Status: DC | PRN
Start: 2024-02-17 — End: 2024-02-17

## 2024-02-17 MED ORDER — SODIUM CHLORIDE 0.9% FLUSH: 10.0000 mL | INTRAVENOUS | Status: DC | PRN

## 2024-02-17 NOTE — Progress Notes (Unsigned)
 [facility]  ONCOLOGY CLINIC PROGRESS NOTE   Patient Care Team: Sun, Vyvyan, MD as PCP - General (Family Medicine) Dasie Leonor CROME, MD as Consulting Physician (General Surgery) Autumn Millman, MD as Consulting Physician (Oncology) Mai Lynwood FALCON, MD as Consulting Physician (Rheumatology)  PATIENT NAME: Sherry Abbott   MR#: [id number] DOB: 1975-08-16  Date of visit: 02/17/2024   ASSESSMENT & PLAN:   Nekeisha Denise Jerry is a 48 y.o. 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  pT3, pN1a, cM0, stage III B disease.  MMR proficient.   No problem-specific Assessment & Plan notes found for this encounter.     I reviewed lab results and outside records for this visit and discussed relevant results with the patient. Diagnosis, plan of care and treatment options were also discussed in detail with the patient. Opportunity provided to ask questions and answers provided to her apparent satisfaction. Provided instructions to call our clinic with any problems, questions or concerns prior to return visit. I recommended to continue follow-up with PCP and sub-specialists. She verbalized understanding and agreed with the plan.   NCCN guidelines have been consulted in the planning of this patient's care.  I spent a total of 30 minutes during this encounter with the patient including review of chart and various tests results, discussions about plan of care and coordination of care plan.   Millman Autumn, MD  02/17/2024 3:25 PM  [facility] - A DEPT OF [facility] [address] [place] [address] Dept: [phone] Dept Fax: [phone]    CHIEF COMPLAINT/ REASON FOR  VISIT:   Invasive adenocarcinoma of the cecum, status post hemicolectomy on 01/19/2024.  pT3, pN1a, cM0, stage III B disease.  MMR proficient.   Current Treatment: Adjuvant systemic chemotherapy with FOLFOX started from 02/15/2024.  INTERVAL HISTORY:    Discussed the use of AI scribe software for clinical note transcription with the patient, who gave verbal consent to proceed.  History of Present Illness   She is scheduled to see a nurse practitioner, Lacie Burton, while her regular provider is out of the office on next visit. She has been in contact with a nutritionist and a Child psychotherapist to help manage her condition and dietary needs.     I have reviewed the past medical history, past surgical history, social history and family history with the patient and they are unchanged from previous note.  HISTORY OF PRESENT ILLNESS:   ONCOLOGY HISTORY:   Patient had routine screening colonoscopy on 01/12/2024, performed by Dr. Saintclair.  It showed a fungating, infiltrative, sessile, polypoid and ulcerated nonobstructing large mass in the cecum and in the ascending colon.  The mass was partially circumferential involving two thirds of the lumen circumference, measuring at least 7 cm in length.  Biopsies were obtained.  3 sessile polyps were found in the transverse colon, descending colon and sigmoid colon measuring up to 6 mm, removed.  Pathology from the cecal mass came back positive for invasive moderately differentiated adenocarcinoma.  MMR proficient.   On 01/16/2024, CT abdomen and pelvis showed 2.8 cm cyst in the left hepatic lobe, simple appearing.  No pathologic lymphadenopathy or other acute findings were noted.   She was referred for surgical oncology evaluation.  She was supposed to see Dr. Ann on 01/17/2024 but presented to the ED that same day with complaints of worsening right lower quadrant abdominal pain, nausea, vomiting and inability to tolerate oral intake.  She was admitted to the  hospital for further evaluation and management.   She was seen by Dr. Dasie in the hospital and she underwent laparoscopic-assisted right hemicolectomy, excision of peritoneal cyst on 01/19/2024.  Final pathology showed invasive moderately differentiated adenocarcinoma with focal mucinous differentiation arising within a tubulovillous adenoma, measuring 6.7 cm in greatest dimension with involvement of submucosa, muscularis propria and into pericolonic adipose tissue.  1 out of 26 lymph nodes were positive for malignancy.  There was another single positive tumor deposit.  All margins were negative.  Grade 2.  No evidence of LVI or PNI.  MMR proficient.     On her consultation with us  on 01/25/2024, request placed for CT of the chest for staging.  On 02/07/2024, staging CT of the chest showed no evidence of intrathoracic metastatic disease.  pT3,pN1a, cM0, Stage III B disease.    Plan for adjuvant chemotherapy with modified FOLFOX-6, every 2 weeks for up to 12 cycles.  Started cycle 1 on 02/15/2024.  Oncology History  Adenocarcinoma of cecum (HCC)  01/17/2024 Initial Diagnosis   Adenocarcinoma of cecum (HCC)   01/25/2024 Cancer Staging   Staging form: Colon and Rectum, AJCC 8th Edition - Pathologic: Stage IIIB (pT3, pN1a, cM0) - Signed by Autumn Millman, MD on 02/15/2024 Total positive nodes: 1 Histologic grading system: 4 grade system Histologic grade (G): G2 Residual tumor (R): R0   02/15/2024 -  Chemotherapy   Patient is on Treatment Plan : COLORECTAL FOLFOX q14d x 3 months         REVIEW OF SYSTEMS:   Review of Systems - Oncology  All other pertinent systems were reviewed with the patient and are negative.  ALLERGIES: She is allergic to betadine [povidone iodine], blueberry [vaccinium angustifolium], milk-related compounds, neosporin [neomycin-bacitracin zn-polymyx], shellfish allergy, and sulfa antibiotics.  MEDICATIONS:  Current Outpatient Medications  Medication Sig Dispense Refill    B Complex Vitamins (VITAMIN B-COMPLEX) TABS Take 1 tablet by mouth daily at 6 (six) AM.     cetirizine (ZYRTEC ALLERGY) 10 MG tablet Take 10 mg by mouth daily.     dexamethasone  (DECADRON ) 4 MG tablet Take 2 tablets (8 mg total) by mouth daily. Start the day after chemotherapy for 2 days. Take with food. 30 tablet 1   diazepam  (VALIUM ) 5 MG tablet Take 1 tablet (5 mg total) by mouth 2 (two) times daily. 10 tablet 0   diphenhydrAMINE  (BENADRYL ) 25 MG tablet Take 1 tablet (25 mg total) by mouth every 6 (six) hours as needed. 30 tablet 0   docusate sodium  (COLACE) 100 MG capsule Take 2 capsules (200 mg total) by mouth 2 (two) times daily as needed for mild constipation. 25 capsule 0   docusate sodium  (COLACE) 100 MG capsule Take 2 capsules (200 mg total) by mouth every 12 (twelve) hours. 120 capsule 0   famotidine  (PEPCID ) 20 MG tablet Take 1 tablet (20 mg total) by mouth 2 (two) times daily. 30 tablet 0   Fluocinolone Acetonide Scalp 0.01 % OIL Apply 1 application  topically once a week. Apply to scalp and neck     fluticasone (FLONASE) 50 MCG/ACT nasal spray Place 2 sprays into both nostrils daily as needed.     folic acid (FOLVITE) 1 MG tablet Take 1 mg by mouth daily.  lidocaine -prilocaine  (EMLA ) cream Apply to affected area once 30 g 3   methotrexate (RHEUMATREX) 2.5 MG tablet Take 15 mg by mouth every Friday. 6 tablets (Patient not taking: Reported on 02/15/2024)     mometasone (NASONEX) 50 MCG/ACT nasal spray Place 2 sprays into the nose daily as needed (Allergies).     montelukast (SINGULAIR) 10 MG tablet Take 10 mg by mouth at bedtime.     Multiple Vitamin (MULTIVITAMIN WITH MINERALS) TABS tablet Take 1 tablet by mouth daily.     ondansetron  (ZOFRAN ) 4 MG tablet Take 1 tablet (4 mg total) by mouth every 8 (eight) hours as needed for nausea or vomiting. 10 tablet 0   ondansetron  (ZOFRAN ) 8 MG tablet Take 1 tablet (8 mg total) by mouth every 8 (eight) hours as needed for nausea or vomiting.  Start on the third day after chemotherapy. 30 tablet 1   oxyCODONE  (OXY IR/ROXICODONE ) 5 MG immediate release tablet Take 1 tablet (5 mg total) by mouth every 8 (eight) hours as needed for severe pain (pain score 7-10) (5 mg for pain score 4-6, 10 mg for pain score 7-10). 12 tablet 0   pantoprazole  (PROTONIX ) 20 MG tablet Take 20 mg by mouth 2 (two) times daily. (Patient taking differently: Take 20 mg by mouth 2 (two) times daily. Taking as needed)     polyethylene glycol powder (GLYCOLAX /MIRALAX ) 17 GM/SCOOP powder Take 17 g by mouth daily as needed. 238 g 0   predniSONE  (DELTASONE ) 10 MG tablet Take 4 tablets (40 mg total) by mouth daily. 12 tablet 0   prochlorperazine  (COMPAZINE ) 10 MG tablet Take 1 tablet (10 mg total) by mouth every 6 (six) hours as needed for nausea or vomiting. 30 tablet 1   Current Facility-Administered Medications  Medication Dose Route Frequency Provider Last Rate Last Admin   Fremanezumab -vfrm SOSY 225 mg  225 mg Subcutaneous Once Ahern, Antonia B, MD       Facility-Administered Medications Ordered in Other Visits  Medication Dose Route Frequency Provider Last Rate Last Admin   heparin  lock flush 100 unit/mL  500 Units Intracatheter Once PRN Issa Kosmicki, MD       sodium chloride  flush (NS) 0.9 % injection 10 mL  10 mL Intracatheter PRN Anjulie Dipierro, MD         VITALS:   Last menstrual period 12/26/2023.  Wt Readings from Last 3 Encounters:  02/15/24 149 lb (67.6 kg)  02/03/24 144 lb (65.3 kg)  02/01/24 146 lb 4.8 oz (66.4 kg)    There is no height or weight on file to calculate BMI.      PHYSICAL EXAM:   Physical Exam Constitutional:      General: She is not in acute distress.    Appearance: Normal appearance.  HENT:     Head: Normocephalic and atraumatic.  Cardiovascular:     Rate and Rhythm: Normal rate.  Pulmonary:     Effort: Pulmonary effort is normal. No respiratory distress.  Chest:     Comments: Right-sided Port-A-Cath in place  without any signs of infection Abdominal:     General: There is no distension.  Neurological:     General: No focal deficit present.     Mental Status: She is alert and oriented to person, place, and time.  Psychiatric:        Mood and Affect: Mood normal.        Behavior: Behavior normal.       LABORATORY DATA:   I have reviewed  the data as listed.  No results found for any visits on 02/17/24.     RADIOGRAPHIC STUDIES:  I have personally reviewed the radiological images as listed and agree with the findings in the report.  CT Chest W Contrast Result Date: 02/09/2024 CLINICAL DATA:  Staging adenocarcinoma of the cecum. * Tracking Code: BO * EXAM: CT CHEST WITH CONTRAST TECHNIQUE: Multidetector CT imaging of the chest was performed during intravenous contrast administration. RADIATION DOSE REDUCTION: This exam was performed according to the departmental dose-optimization program which includes automated exposure control, adjustment of the mA and/or kV according to patient size and/or use of iterative reconstruction technique. CONTRAST:  75mL OMNIPAQUE  IOHEXOL  300 MG/ML  SOLN COMPARISON:  CTA chest 06/12/2008.  Abdomen pelvis CT 01/16/2024. FINDINGS: Cardiovascular: Right IJ chest port in place with tip extending to the SVC right atrial junction region. Mild soft tissue stranding adjacent to the port. This could be postprocedural. Heart is nonenlarged. No pericardial effusion. The thoracic aorta has a normal course and caliber. Slight pulsation artifact along the ascending aorta. Mediastinum/Nodes: No specific abnormal lymph node enlargement identified in the axillary regions, hilum or mediastinum. Preserved thyroid gland. Normal caliber thoracic esophagus. Lungs/Pleura: There is some breathing motion. No consolidation, pneumothorax or effusion. Calcified left lower lobe lung nodule identified on image 109 of series 6 consistent with old granulomatous disease. There is a noncalcified solid  lobular nodule in the posterior right lower lobe measuring 6 mm on series 6, image 96. Of interest distant was present in 2009 consistent with long-term stability and a benign nodule. Upper Abdomen: Adrenal glands are preserved in the upper abdomen. Please correlate with recent CT scan of the abdomen and pelvis. Musculoskeletal: Slight curvature of the spine. Mild degenerative changes. IMPRESSION: No CT evidence of metastatic disease to the thorax. No lymph node enlargement, new lung mass or pleural effusion. Chest port. Electronically Signed   By: Ranell Bring M.D.   On: 02/09/2024 10:47   DG C-Arm 1-60 Min Result Date: 02/03/2024 CLINICAL DATA:  Surgery. EXAM: DG C-ARM 1-60 MIN FLUOROSCOPY: Fluoroscopy Time:  9.2 seconds Radiation Exposure Index (if provided by the fluoroscopic device): 1.27 mGy Number of Acquired Spot Images: 1 COMPARISON:  None Available. FINDINGS: Single fluoroscopic spot view submitted from the operating room. Right chest port is in place. IMPRESSION: Fluoroscopic spot view post chest port placement. Electronically Signed   By: Andrea Gasman M.D.   On: 02/03/2024 16:39   DG CHEST PORT 1 VIEW Result Date: 02/03/2024 CLINICAL DATA:  [id number] Port-A-Cath in place [id number]. EXAM: PORTABLE CHEST 1 VIEW COMPARISON:  01/16/2024. FINDINGS: Low lung volume. Mild diffuse pulmonary vascular congestion, likely accentuated by low lung volume. No frank pulmonary edema. Bilateral lung fields are otherwise clear. No dense consolidation or lung collapse. Bilateral costophrenic angles are clear. Normal cardio-mediastinal silhouette. No acute osseous abnormalities. The soft tissues are within normal limits. Right-sided CT Port-A-Cath is seen with its tip overlying the cavoatrial junction region. No pneumothorax. IMPRESSION: *Mild diffuse pulmonary vascular congestion, likely accentuated by low lung volume. No frank pulmonary edema. *Right-sided CT Port-A-Cath is seen with its tip overlying the cavoatrial  junction region. No pneumothorax. Electronically Signed   By: Ree Molt M.D.   On: 02/03/2024 13:21   DG Abd Portable 1V Result Date: 01/23/2024 CLINICAL DATA:  Constipation EXAM: PORTABLE ABDOMEN - 1 VIEW COMPARISON:  None Available. FINDINGS: The bowel gas pattern is normal. No radio-opaque calculi or other significant radiographic abnormality are seen. IMPRESSION: Negative.  No evidence  of constipation Electronically Signed   By: Franky Chard M.D.   On: 01/23/2024 08:33    CODE STATUS:  Code Status History     Date Active Date Inactive Code Status Order ID Comments User Context   01/17/2024 0459 01/23/2024 1727 Full Code [id number]  Alfornia Madison, MD ED    Questions for Most Recent Historical Code Status (Order [id number])     Question Answer   By: Consent: discussion documented in EHR            No orders of the defined types were placed in this encounter.    Future Appointments  Date Time Provider Department Center  02/27/2024 10:30 AM [facility] FLUSH [facility] None  02/27/2024 11:00 AM Ann Mayme POUR, NP [facility] None  02/27/2024 12:00 PM [facility] INFUSION [facility] None  02/27/2024  1:45 PM Dasie Simple, RD [facility] None  02/29/2024  1:00 PM [facility] FLUSH [facility] None  04/16/2024  9:00 AM Woodard Burnard BROCKS, Counselor [facility] None  04/16/2024 10:15 AM [facility] LAB [facility] None      This document was completed utilizing speech recognition software. Grammatical errors, random word insertions, pronoun errors, and incomplete sentences are an occasional consequence of this system due to software limitations, ambient noise, and hardware issues. Any formal questions or concerns about the content, text or information contained within the body of this dictation should be directly addressed to the provider for clarification.

## 2024-02-20 LAB — GUARDANT REVEAL

## 2024-02-21 ENCOUNTER — Other Ambulatory Visit: Payer: Self-pay

## 2024-02-21 NOTE — Progress Notes (Signed)
 I followed up on a VM from this pt. Sherry Abbott has concerns around her lack of energy and wants some tips on what she could eat to bring more energy. 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 She would like to talk with a dietitian sooner than her next scheduled appt with Joli on 07/21. Both Dr. Pasam and Joli were informed of the issue this pt is having and Diego Dawn, Dietitian was asked to reach back out to the patient.

## 2024-02-22 ENCOUNTER — Encounter: Payer: Self-pay | Admitting: Oncology

## 2024-02-22 ENCOUNTER — Inpatient Hospital Stay

## 2024-02-22 ENCOUNTER — Telehealth: Payer: Self-pay | Admitting: Oncology

## 2024-02-22 DIAGNOSIS — T451X5A Adverse effect of antineoplastic and immunosuppressive drugs, initial encounter: Secondary | ICD-10-CM | POA: Insufficient documentation

## 2024-02-22 NOTE — Progress Notes (Signed)
 Nutrition  Message received from nursing.    RD called but no answer. Left message with call back number.    Sherry Abbott B. Dasie SOLON, CSO, LDN Registered Dietitian [phone]

## 2024-02-22 NOTE — Assessment & Plan Note (Addendum)
 Please review oncology history for additional details and timeline of events.    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 MMR proficient.   Previously I discussed diagnosis, staging, prognosis, plan of care, treatment options.  Reviewed NCCN guidelines.  On 02/07/2024, staging CT of the chest showed no evidence of intrathoracic metastatic disease.  Chemotherapy is planned to target residual microscopic disease. FOLFOX regimen, consisting of fluorouracil  and oxaliplatin , will be administered biweekly for six months. Potential side effects include nausea, diarrhea, myelosuppression, mucositis, cold sensitivity, and neuropathy. The goal is curative, emphasizing chemotherapy's role in recurrence prevention. Treatment timing will be adjusted to accommodate her work schedule and personal commitments. Chemotherapy dosage may be modified based on side effects and hematologic parameters.   She seemed to have suffered an allergic reaction to oxaliplatin  with tongue muscle twitching, difficulty speaking, and jaw muscle tightness, the day after chemo.  Discussed alternatives.  Plan is to proceed with reduced dose oxaliplatin  at 50% at a slower rate over 3 hours with extra allergy medicines.  She is scheduled to receive cycle 2 on 02/27/2024.  Also given progressive fatigue, we will skip 5-FU bolus from cycle 2 onwards.  She was made aware of my absence in clinic on her return visit and she will be seen by Lacie Burton, NP.  I will communicate recent developments with her for continuation of care.  - She was previously referred to genetic counseling for genetic testing.  - Monitor with circulating tumor DNA test and CT scans every three months for two years.  -We will schedule colonoscopy one year post-surgery.

## 2024-02-22 NOTE — Progress Notes (Signed)
 Nutrition Follow-up:  Patient stage III adenocarcinoma of cecum with lymph node involvement.  S/p hemicolectomy on 6/12.  Has received her first cycle of folfox but had allergic reaction (mouth spasms).   Spoke with patient via phone.  Reports that she is suffering from fatigue and wants to know foods that will improve her fatigue.  Typically follows a pescatarian diet and has increased consumption of fish.  She does not tolerate dairy.  Eats mostly plant foods but has not been post op due to low fiber diet that surgeon wants her to follow.  Has been trying to drink 2-3 liters of water  a day.  Has added almond butter.  Has continued to work remotely and has been trying to move.  Has added orgain vegan protein   Medications: Vit B complex, MVI, folic acid  Labs: Hgb 9.4, Fe 51, Vit D 28, Vit B 12 1046  Anthropometrics:   Weight 149 lb on 7/9  146 lb on 6/25 157 lb on 6/16   NUTRITION DIAGNOSIS: Altered GI function continues   INTERVENTION:  Patient now ~5 weeks post op. Encouraged patient to discuss with surgeon options of gradually introducing some higher fiber foods back in diet.  She has follow-up at the end of the month. Discussed foods to add following Intestinal surgery.  Will email Diet and Nutrition After Intestinal Surgery to patient Emailed Anemia handout with foods to choose.  Patient limited as patient not able to eat many of the foods on the list.   Continue MVI, Vit B complex and folic acid Recommend adding Vit D 1000 international units daily due to low level. Discussed that Vit D level should be checked annually while on supplement.  Encouraged movement to help with fatigue Patient has contact number    MONITORING, EVALUATION, GOAL: weight trends, intake   NEXT VISIT: Monday, July 21 during infusion  Malory Spurr B. Dasie SOLON, CSO, LDN Registered Dietitian [phone]

## 2024-02-22 NOTE — Progress Notes (Signed)
 Nutrition  Called patient again at 2:50 pm but no answer.  Left message with call back number.   Lark Runk B. Dasie SOLON, CSO, LDN Registered Dietitian [phone]

## 2024-02-22 NOTE — Telephone Encounter (Signed)
 Pt stated that she had reaction to tx pt stated that Dr. Autumn will change medication due to mouth spasms. Dr. Autumn to consult w/ Dr. Lanny  per the patient. I followed up with Dr. Pasam and [place] to inquire about the next steps for scheduling.

## 2024-02-22 NOTE — Assessment & Plan Note (Signed)
 She seemed to have suffered an allergic reaction to oxaliplatin  with tongue muscle twitching, difficulty speaking, and jaw muscle tightness, the day after chemo.  Discussed alternatives.  Plan is to proceed with reduced dose oxaliplatin  at 50% at a slower rate over 3 hours with extra allergy medicines including Benadryl  and Pepcid .  She is scheduled to receive cycle 2 on 02/27/2024.

## 2024-02-22 NOTE — Telephone Encounter (Signed)
 Sindia called in to change her treatment dates to be scheduled for Wednesdays. I informed Lexia that we can make that change for the week of 8/5 with de coupled appointments. Kashari was acceptable to that change.

## 2024-02-23 ENCOUNTER — Telehealth: Payer: Self-pay

## 2024-02-23 NOTE — Telephone Encounter (Signed)
 Notified the pt spouse regarding his FMLA forms being completed,faxed, and confirmation received. Mr. Blow stated that he will pick up his copy on Monday 02/27/2024. No questions or concerns to be noted at this time.

## 2024-02-27 ENCOUNTER — Encounter: Payer: Self-pay | Admitting: Nurse Practitioner

## 2024-02-27 ENCOUNTER — Inpatient Hospital Stay (HOSPITAL_BASED_OUTPATIENT_CLINIC_OR_DEPARTMENT_OTHER): Admitting: Nurse Practitioner

## 2024-02-27 ENCOUNTER — Inpatient Hospital Stay

## 2024-02-27 ENCOUNTER — Encounter: Payer: Self-pay | Admitting: Oncology

## 2024-02-27 VITALS — BP 130/60 | HR 75 | Temp 97.5°F | Resp 17 | Wt 152.0 lb

## 2024-02-27 VITALS — BP 112/70 | HR 74 | Resp 16

## 2024-02-27 DIAGNOSIS — C180 Malignant neoplasm of cecum: Secondary | ICD-10-CM

## 2024-02-27 DIAGNOSIS — Z5111 Encounter for antineoplastic chemotherapy: Secondary | ICD-10-CM

## 2024-02-27 DIAGNOSIS — Z95828 Presence of other vascular implants and grafts: Secondary | ICD-10-CM

## 2024-02-27 LAB — CMP (CANCER CENTER ONLY)
ALT: 23 U/L (ref 0–44)
AST: 22 U/L (ref 15–41)
Albumin: 3.9 g/dL (ref 3.5–5.0)
Alkaline Phosphatase: 42 U/L (ref 38–126)
Anion gap: 5 (ref 5–15)
BUN: 11 mg/dL (ref 6–20)
CO2: 31 mmol/L (ref 22–32)
Calcium: 9 mg/dL (ref 8.9–10.3)
Chloride: 104 mmol/L (ref 98–111)
Creatinine: 0.83 mg/dL (ref 0.44–1.00)
GFR, Estimated: >60 mL/min (ref >60)
Glucose, Bld: 96 mg/dL (ref 70–99)
Potassium: 3.9 mmol/L (ref 3.5–5.1)
Sodium: 140 mmol/L (ref 135–145)
Total Bilirubin: 0.4 mg/dL (ref 0.0–1.2)
Total Protein: 6.9 g/dL (ref 6.5–8.1)

## 2024-02-27 LAB — CBC WITH DIFFERENTIAL (CANCER CENTER ONLY)
Abs Immature Granulocytes: 0.01 K/uL (ref 0.00–0.07)
Basophils Absolute: 0 K/uL (ref 0.0–0.1)
Basophils Relative: 1 %
Eosinophils Absolute: 0.1 K/uL (ref 0.0–0.5)
Eosinophils Relative: 3 %
HCT: 28 % — ABNORMAL LOW (ref 36.0–46.0)
Hemoglobin: 9.5 g/dL — ABNORMAL LOW (ref 12.0–15.0)
Immature Granulocytes: 0 %
Lymphocytes Relative: 32 %
Lymphs Abs: 1.3 K/uL (ref 0.7–4.0)
MCH: 28.4 pg (ref 26.0–34.0)
MCHC: 33.9 g/dL (ref 30.0–36.0)
MCV: 83.6 fL (ref 80.0–100.0)
Monocytes Absolute: 0.3 K/uL (ref 0.1–1.0)
Monocytes Relative: 9 %
Neutro Abs: 2.2 K/uL (ref 1.7–7.7)
Neutrophils Relative %: 55 %
Platelet Count: 222 K/uL (ref 150–400)
RBC: 3.35 MIL/uL — ABNORMAL LOW (ref 3.87–5.11)
RDW: 16.1 % — ABNORMAL HIGH (ref 11.5–15.5)
WBC Count: 4 K/uL (ref 4.0–10.5)
nRBC: 0 % (ref 0.0–0.2)

## 2024-02-27 LAB — PREGNANCY, URINE: Preg Test, Ur: NEGATIVE

## 2024-02-27 MED ORDER — OXALIPLATIN CHEMO INJECTION 100 MG/20ML
42.0000 mg/m2 | Freq: Once | INTRAVENOUS | Status: AC
  Administered 2024-02-27: 75 mg via INTRAVENOUS
  Filled 2024-02-27: qty 15

## 2024-02-27 MED ORDER — DIPHENHYDRAMINE HCL 50 MG/ML IJ SOLN
25.0000 mg | Freq: Once | INTRAMUSCULAR | Status: AC
  Administered 2024-02-27: 25 mg via INTRAVENOUS
  Filled 2024-02-27: qty 1

## 2024-02-27 MED ORDER — DEXAMETHASONE SODIUM PHOSPHATE 10 MG/ML IJ SOLN
10.0000 mg | Freq: Once | INTRAMUSCULAR | Status: AC
  Administered 2024-02-27: 10 mg via INTRAVENOUS
  Filled 2024-02-27: qty 1

## 2024-02-27 MED ORDER — SODIUM CHLORIDE 0.9% FLUSH: 10.0000 mL | INTRAVENOUS | Status: DC | PRN

## 2024-02-27 MED ORDER — FAMOTIDINE IN NACL 20-0.9 MG/50ML-% IV SOLN
20.0000 mg | Freq: Once | INTRAVENOUS | Status: AC
  Administered 2024-02-27: 20 mg via INTRAVENOUS
  Filled 2024-02-27: qty 50

## 2024-02-27 MED ORDER — HEPARIN SOD (PORK) LOCK FLUSH 100 UNIT/ML IV SOLN: 500.0000 [IU] | Freq: Once | INTRAVENOUS | Status: DC | PRN

## 2024-02-27 MED ORDER — LEUCOVORIN CALCIUM INJECTION 350 MG
400.0000 mg/m2 | Freq: Once | INTRAVENOUS | Status: AC
  Administered 2024-02-27: 700 mg via INTRAVENOUS
  Filled 2024-02-27: qty 35

## 2024-02-27 MED ORDER — SODIUM CHLORIDE 0.9% FLUSH
10.0000 mL | Freq: Once | INTRAVENOUS | Status: AC
  Administered 2024-02-27: 10 mL

## 2024-02-27 MED ORDER — SODIUM CHLORIDE 0.9 % IV SOLN
2400.0000 mg/m2 | INTRAVENOUS | Status: DC
  Administered 2024-02-27: 4200 mg via INTRAVENOUS
  Filled 2024-02-27: qty 84

## 2024-02-27 MED ORDER — PALONOSETRON HCL INJECTION 0.25 MG/5ML
0.2500 mg | Freq: Once | INTRAVENOUS | Status: AC
  Administered 2024-02-27: 0.25 mg via INTRAVENOUS
  Filled 2024-02-27: qty 5

## 2024-02-27 MED ORDER — PROCHLORPERAZINE MALEATE 10 MG PO TABS: 10.0000 mg | ORAL_TABLET | Freq: Four times a day (QID) | ORAL | 2 refills | Status: DC | PRN

## 2024-02-27 MED ORDER — DEXTROSE 5 % IV SOLN
INTRAVENOUS | Status: DC
Start: 2024-02-27 — End: 2024-02-27

## 2024-02-27 NOTE — Progress Notes (Signed)
 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 14 days x 6 months)  INTERVAL HISTORY Ms. Vilar returns for follow-up and treatment as scheduled, last seen by Dr. Autumn 02/15/2024 with first FOLFOX.  She presented to ED 7/10 with concern for allergic reaction reporting tongue fasciculations, jaw tightness, and difficulty speaking.  This was felt to be related to oxaliplatin .  5-FU pump was disconnected.  Dr. Autumn spoke to the ED team and the patient for plan going forward.   Patient presents with her spouse.  She reports extreme cold sensitivity days 1 through 3, only able to tolerate hot liquids, then improved on days 4 and 5.  She is still little bit sensitive, no neuropathy in the absence of cold exposure.  Denies mucositis.  Had a hard time staying hydrated due to limited cold options.  She had nausea from days 1 through 5, took Compazine  until day 3 then stopped and started Zofran  twice a day which she continues, no vomiting.  Did not use Decadron .  The past week she was able to eat better.  Managing  constipation with MiraLAX  and stool softener.  Still little bit sore on the right side, will see surgeon this week.  ROS  All other systems reviewed and negative  Past Medical History:  Diagnosis Date   Allergic rhinitis    Allergic to food    blueberry   Allergic to shellfish    Asthma    Cancer (HCC)    Dermatographic urticaria    Hidradenitis suppurativa    Labral tear of right hip joint    surgery   Low back pain    Nausea and vomiting 01/17/2024   Other chronic allergic conjunctivitis    Polyarthralgia      Past Surgical History:  Procedure Laterality Date   CESAREAN SECTION  2008   DERMOID CYST  EXCISION Right 03/2022   right arm in dermatologist office   LABRAL REPAIR Right 04/2021   right hip   LAPAROSCOPIC PARTIAL COLECTOMY Right 01/19/2024   Procedure: LAPAROSCOPIC PARTIAL COLECTOMY;  Surgeon: Dasie Leonor CROME, MD;  Location: [facility] OR;  Service: General;  Laterality: Right;  LAPAROSCOPIC ASSISTED RIGHT HEMI COLECTOMY   PORTACATH PLACEMENT N/A 02/03/2024   Procedure: INSERTION PORTACATH RIGHT SUBCLAVIAN;  Surgeon: Dasie Leonor CROME, MD;  Location: [facility] OR;  Service: General;  Laterality: N/A;   ROTATOR CUFF REPAIR Right    2015     Outpatient Encounter Medications as of 02/27/2024  Medication Sig   B Complex Vitamins (VITAMIN B-COMPLEX) TABS Take 1 tablet by mouth daily at 6 (  six) AM.   cetirizine (ZYRTEC ALLERGY) 10 MG tablet Take 10 mg by mouth daily.   dexamethasone  (DECADRON ) 4 MG tablet Take 2 tablets (8 mg total) by mouth daily. Start the day after chemotherapy for 2 days. Take with food.   diazepam  (VALIUM ) 5 MG tablet Take 1 tablet (5 mg total) by mouth 2 (two) times daily.   diphenhydrAMINE  (BENADRYL ) 25 MG tablet Take 1 tablet (25 mg total) by mouth every 6 (six) hours as needed.   docusate sodium  (COLACE) 100 MG capsule Take 2 capsules (200 mg total) by mouth 2 (two) times daily as needed for mild constipation.   docusate sodium  (COLACE) 100 MG capsule Take 2  capsules (200 mg total) by mouth every 12 (twelve) hours.   famotidine  (PEPCID ) 20 MG tablet Take 1 tablet (20 mg total) by mouth 2 (two) times daily.   Fluocinolone Acetonide Scalp 0.01 % OIL Apply 1 application  topically once a week. Apply to scalp and neck   fluticasone (FLONASE) 50 MCG/ACT nasal spray Place 2 sprays into both nostrils daily as needed.   folic acid (FOLVITE) 1 MG tablet Take 1 mg by mouth daily.   lidocaine -prilocaine  (EMLA ) cream Apply to affected area once   methotrexate (RHEUMATREX) 2.5 MG tablet Take 15 mg by mouth every Friday. 6 tablets   mometasone (NASONEX) 50 MCG/ACT nasal spray Place 2 sprays into the nose daily as needed (Allergies).   montelukast (SINGULAIR) 10 MG tablet Take 10 mg by mouth at bedtime.   Multiple Vitamin (MULTIVITAMIN WITH MINERALS) TABS tablet Take 1 tablet by mouth daily.   ondansetron  (ZOFRAN ) 4 MG tablet Take 1 tablet (4 mg total) by mouth every 8 (eight) hours as needed for nausea or vomiting.   ondansetron  (ZOFRAN ) 8 MG tablet Take 1 tablet (8 mg total) by mouth every 8 (eight) hours as needed for nausea or vomiting. Start on the third day after chemotherapy.   oxyCODONE  (OXY IR/ROXICODONE ) 5 MG immediate release tablet Take 1 tablet (5 mg total) by mouth every 8 (eight) hours as needed for severe pain (pain score 7-10) (5 mg for pain score 4-6, 10 mg for pain score 7-10).   pantoprazole  (PROTONIX ) 20 MG tablet Take 20 mg by mouth 2 (two) times daily. (Patient taking differently: Take 20 mg by mouth 2 (two) times daily. Taking as needed)   polyethylene glycol powder (GLYCOLAX /MIRALAX ) 17 GM/SCOOP powder Take 17 g by mouth daily as needed.   predniSONE  (DELTASONE ) 10 MG tablet Take 4 tablets (40 mg total) by mouth daily.   [DISCONTINUED] prochlorperazine  (COMPAZINE ) 10 MG tablet Take 1 tablet (10 mg total) by mouth every 6 (six) hours as needed for nausea or vomiting.   prochlorperazine  (COMPAZINE ) 10 MG tablet Take 1 tablet (10 mg total) by  mouth every 6 (six) hours as needed for nausea or vomiting.   Facility-Administered Encounter Medications as of 02/27/2024  Medication   Fremanezumab -vfrm SOSY 225 mg     Today's Vitals   02/27/24 1124 02/27/24 1139  BP: 130/60   Pulse: 75   Resp: 17   Temp: (!) 97.5 F (36.4 C)   SpO2: 99%   Weight: 152 lb (68.9 kg)   PainSc:  0-No pain   Body mass index is 25.29 kg/m.   ECOG PERFORMANCE STATUS: 1 - Symptomatic but completely ambulatory  PHYSICAL EXAM GENERAL:alert, no distress and comfortable SKIN: no rash  EYES: sclera clear LUNGS: clear with normal breathing effort HEART: regular rate & rhythm, no lower extremity  edema ABDOMEN: abdomen soft, normal bowel sounds.  Mild TTP in the RUQ NEURO: alert & oriented x 3 with fluent speech, no focal motor deficits PAC covered with gauze    CBC    Latest Ref Rng & Units 02/27/2024   10:57 AM 02/16/2024    4:06 PM 02/15/2024    9:03 AM  CBC  WBC 4.0 - 10.5 K/uL 4.0  7.7  4.0   Hemoglobin 12.0 - 15.0 g/dL 9.5  9.4  9.1   Hematocrit 36.0 - 46.0 % 28.0  28.0  26.7   Platelets 150 - 400 K/uL 222  226  212       CMP     Latest Ref Rng & Units 02/16/2024    4:06 PM 02/15/2024    9:03 AM 02/01/2024    9:35 AM  CMP  Glucose 70 - 99 mg/dL 849  87  76   BUN 6 - 20 mg/dL 12  14  9    Creatinine 0.44 - 1.00 mg/dL 9.20  9.31  9.28   Sodium 135 - 145 mmol/L 136  140  134   Potassium 3.5 - 5.1 mmol/L 3.6  3.8  4.1   Chloride 98 - 111 mmol/L 105  106  103   CO2 22 - 32 mmol/L 21  28  27    Calcium  8.9 - 10.3 mg/dL 9.1  9.1  8.9   Total Protein 6.5 - 8.1 g/dL 7.4  7.0  7.3   Total Bilirubin 0.0 - 1.2 mg/dL 0.6  0.3  0.3   Alkaline Phos 38 - 126 U/L 38  42  36   AST 15 - 41 U/L 24  18  26    ALT 0 - 44 U/L 19  15  35       ASSESSMENT & PLAN: 48 year old female  Adenocarcinoma of the cecum -Screening colonoscopy 01/12/2024 Myla) showed a nonobstructing large mass in the cecum/ascending colon. Pathology positive for invasive  moderately differentiated adenocarcinoma. MMR proficient.  - Staging CT 01/16/2024 showed 2.8 cm cyst in the left hepatic lobe, No pathologic lymphadenopathy or other acute findings -Referred for surgical oncology evaluation, supposed to see Dr. Ann on 01/17/2024 but presented to the ED that same day with complaints of worsening right lower quadrant abdominal pain, nausea, vomiting and inability to tolerate oral intake. -Seen by Dr. Dasie in the hospital and she underwent laparoscopic-assisted right hemicolectomy, excision of peritoneal cyst on 01/19/2024.  Final pathology showed invasive moderately differentiated adenocarcinoma with focal mucinous differentiation arising within a tubulovillous adenoma, measuring 6.7 cm in greatest dimension with involvement of submucosa, muscularis propria and into pericolonic adipose tissue.  1 out of 26 lymph nodes were positive for malignancy.  There was another single positive tumor deposit.  All margins were negative.  Grade 2.  No evidence of LVI or PNI.  MMR proficient.   -Preop CEA 01/17/2024: 5.4; postop CEA 01/25/2024 normal, 2.6 -CT DNA 02/06/2024 not detected - CT chest 02/07/2024 negative - Recommendation for adjuvant chemo (FOLFOX q. 14 days x 6 months), starting 02/15/24.  C1 D2 had jaw tightness with tongue fasciculations, seen in ED felt to be related to oxalic.  Pump stopped prematurely.  Recovered - Ms. Fryman appears stable, she completed cycle 1 FOLFOX, tolerating moderately well with cold sensitivity and nausea.  We reviewed symptom management, she will take Decadron , Compazine , and add on Zofran  and alternate when appropriate.  Will arrange IV fluids and antiemetics on day 3 - For side effects from oxaliplatin ,  the plan is to reduce dose to 50%, give over 3 hours, and premed with Benadryl  and Pepcid .  Patient, nursing, and pharmacy aware - Labs reviewed, adequate to proceed - Follow-up and cycle 3 in 2 weeks, or sooner if needed   PLAN: -ED course,  MD communication, and today's labs reviewed -Proceed with C2 FOLFOX, with 50% Oxali dose reduction, benadryl /pepcid  premeds, and prolonged duration 3 hours due to reaction/cold sensitivity  -IVF/anti-emetics with pump d/c on day 3 -Reviewed symptom management for nausea (decadron , compazine -refilled, zofran ) -F/up and C3 in 2 weeks, or sooner if needed   All questions were answered. The patient knows to call the clinic with any problems, questions or concerns. No barriers to learning were detected. I spent 20 minutes counseling the patient face to face. The total time spent in the appointment was 30 minutes and more than 50% was on counseling, review of test results, and coordination of care.   Dabid Godown K Eilene Voigt, NP 02/27/2024

## 2024-02-27 NOTE — Progress Notes (Signed)
 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  S/p hemicolectomy on 6/12.  Receiving folfox.  Followed by Dr Autumn  Met with patient during infusion.  Reports that over the weekend appetite has been doing well.  Had coconut rice, salmon, green beans.  Has been eating dry cereal, diluted juice, oatmeal, eggs, almond butter sandwich and mixed in oatmeal.  Trying owynn shake.  Using orgain protein powder.  Says that she is taking zofran  more frequently which has helped her nausea.     Medications: reviewed  Labs: reviewed  Anthropometrics:   Weight 152 lb today, increased  149 lb on 7/9 146 lb on 6/25 157 lb on 6/16   NUTRITION DIAGNOSIS: Altered GI function continues    INTERVENTION:  Patient has follow up with surgery on 7/25. She will discuss with surgeon ability to liberalize diet.  Continue small frequent meals, including protein source.     MONITORING, EVALUATION, GOAL: weight trends, intake   NEXT VISIT: Wed, Aug 6 with [name]  Sherry Abbott SOLON, CSO, LDN Registered Dietitian [phone]

## 2024-02-27 NOTE — Patient Instructions (Signed)
 [facility] - A DEPT OF [facility]  Discharge Instructions: Thank you for choosing [facility] to provide your oncology and hematology care.   If you have a lab appointment with the Cancer Center, please go directly to the Cancer Center and check in at the registration area.   Wear comfortable clothing and clothing appropriate for easy access to any Portacath or PICC line.   We strive to give you quality time with your provider. You may need to reschedule your appointment if you arrive late (15 or more minutes).  Arriving late affects you and other patients whose appointments are after yours.  Also, if you miss three or more appointments without notifying the office, you may be dismissed from the clinic at the provider's discretion.      For prescription refill requests, have your pharmacy contact our office and allow 72 hours for refills to be completed.    Today you received the following chemotherapy and/or immunotherapy agents: Oxaliplatin, Leucovorin, 5FU      To help prevent nausea and vomiting after your treatment, we encourage you to take your nausea medication as directed.  BELOW ARE SYMPTOMS THAT SHOULD BE REPORTED IMMEDIATELY: *FEVER GREATER THAN 100.4 F (38 C) OR HIGHER *CHILLS OR SWEATING *NAUSEA AND VOMITING THAT IS NOT CONTROLLED WITH YOUR NAUSEA MEDICATION *UNUSUAL SHORTNESS OF BREATH *UNUSUAL BRUISING OR BLEEDING *URINARY PROBLEMS (pain or burning when urinating, or frequent urination) *BOWEL PROBLEMS (unusual diarrhea, constipation, pain near the anus) TENDERNESS IN MOUTH AND THROAT WITH OR WITHOUT PRESENCE OF ULCERS (sore throat, sores in mouth, or a toothache) UNUSUAL RASH, SWELLING OR PAIN  UNUSUAL VAGINAL DISCHARGE OR ITCHING   Items with * indicate a potential emergency and should be followed up as soon as possible or go to the Emergency Department if any problems should occur.  Please show the CHEMOTHERAPY ALERT  CARD or IMMUNOTHERAPY ALERT CARD at check-in to the Emergency Department and triage nurse.  Should you have questions after your visit or need to cancel or reschedule your appointment, please contact [facility] - A DEPT OF [facility]  Dept: [phone]  and follow the prompts.  Office hours are 8:00 a.m. to 4:30 p.m. Monday - Friday. Please note that voicemails left after 4:00 p.m. may not be returned until the following business day.  We are closed weekends and major holidays. You have access to a nurse at all times for urgent questions. Please call the main number to the clinic Dept: [phone] and follow the prompts.   For any non-urgent questions, you may also contact your provider using MyChart. We now offer e-Visits for anyone 47 and older to request care online for non-urgent symptoms. For details visit [email or web address].   Also download the MyChart app! Go to the app store, search "MyChart", open the app, select [place], and log in with your MyChart username and password.

## 2024-02-27 NOTE — Progress Notes (Signed)
 PATIENT NAVIGATOR PROGRESS NOTE  Name: Sherry Abbott Date: 02/27/2024 MRN: [id number]  DOB: 10-Jun-1976  Patient is established with a treatment plan and is actively engaged in care. Nurse Navigator services not currently indicated at this time. Will re-evaluate if needs change or if additional support is requested.

## 2024-02-28 ENCOUNTER — Emergency Department (HOSPITAL_COMMUNITY)
Admission: EM | Admit: 2024-02-28 | Discharge: 2024-02-28 | Disposition: A | Discharge status: Home / Self Care | Attending: Emergency Medicine | Admitting: Emergency Medicine

## 2024-02-28 ENCOUNTER — Telehealth: Payer: Self-pay

## 2024-02-28 ENCOUNTER — Other Ambulatory Visit: Payer: Self-pay

## 2024-02-28 ENCOUNTER — Encounter (HOSPITAL_COMMUNITY): Payer: Self-pay

## 2024-02-28 DIAGNOSIS — G249 Dystonia, unspecified: Secondary | ICD-10-CM | POA: Insufficient documentation

## 2024-02-28 DIAGNOSIS — T7840XA Allergy, unspecified, initial encounter: Secondary | ICD-10-CM | POA: Diagnosis present

## 2024-02-28 MED ORDER — LACTATED RINGERS IV BOLUS
1000.0000 mL | Freq: Once | INTRAVENOUS | Status: AC
  Administered 2024-02-28: 1000 mL via INTRAVENOUS

## 2024-02-28 MED ORDER — DIPHENHYDRAMINE HCL 50 MG/ML IJ SOLN
25.0000 mg | Freq: Once | INTRAMUSCULAR | Status: AC
  Administered 2024-02-28: 25 mg via INTRAVENOUS
  Filled 2024-02-28: qty 1

## 2024-02-28 NOTE — Telephone Encounter (Signed)
 Called from triage, patient presented to ED with jaw pain after chemotherapy. Last time she had similar reaction and thought to be from oxaliplatin . Dose reduced per chart but patient has similar reaction. Chart reviewed and report no cold beverage.  I called and spoke to husband. Report no reaction during or after oxaliplatin . Dose had been reduced by 50%. Reaction started about 24 hours later both time during 5FU. When pump discontinued, reaction dissipated. She then received solumedrol and valium .  Discussed possible reaction from 5FU given the timing of onset. Her ctDNA was negative. Recommend trial of oxaliplatin  only on cycle 3 at dose of cycle 2 without 5FU and reassess.  Heather. You see her next time. I recommend not giving 5FU but just oxaliplatin  at the 50% dose reduction as in C2 and see how she does.  Ok to deaccess and stop 5FU given toxicity this cycle. Give solumedrol and valium  and other supportive medications as indicated per ED evaluation.

## 2024-02-28 NOTE — ED Provider Notes (Signed)
 [facility]  EMERGENCY DEPARTMENT AT [facility] Provider Note   CSN: [id number] Arrival date & time: 02/28/24  8096     Patient presents with: Allergic Reaction   Sherry Abbott is a 48 y.o. female.  {Add pertinent medical, surgical, social history, OB history to HPI:682} 48 year old female, currently receiving chemotherapy, here today after she started to have jaw clenching and abnormal tongue movements while being treated with 5-fluorouracil .  This is the second time she has had a similar reaction during infusion with this medication.  Her oncologist recommended she stop the infusion and have her come to the emergency room.   Allergic Reaction      Prior to Admission medications   Medication Sig Start Date End Date Taking? Authorizing Provider  B Complex Vitamins (VITAMIN B-COMPLEX) TABS Take 1 tablet by mouth daily at 6 (six) AM. 06/23/20   [provider]  cetirizine (ZYRTEC ALLERGY) 10 MG tablet Take 10 mg by mouth daily.    [provider]  dexamethasone  (DECADRON ) 4 MG tablet Take 2 tablets (8 mg total) by mouth daily. Start the day after chemotherapy for 2 days. Take with food. 01/25/24   Pasam, Chinita, MD  diazepam  (VALIUM ) 5 MG tablet Take 1 tablet (5 mg total) by mouth 2 (two) times daily. 02/16/24   Keith, Kayla N, PA-C  diphenhydrAMINE  (BENADRYL ) 25 MG tablet Take 1 tablet (25 mg total) by mouth every 6 (six) hours as needed. 02/16/24   Francis Ileana SAILOR, PA-C  docusate sodium  (COLACE) 100 MG capsule Take 2 capsules (200 mg total) by mouth 2 (two) times daily as needed for mild constipation. 01/23/24   Singh, Prashant K, MD  docusate sodium  (COLACE) 100 MG capsule Take 2 capsules (200 mg total) by mouth every 12 (twelve) hours. 02/16/24   Keith, Kayla N, PA-C  famotidine  (PEPCID ) 20 MG tablet Take 1 tablet (20 mg total) by mouth 2 (two) times daily. 02/16/24   Keith, Kayla N, PA-C  Fluocinolone Acetonide Scalp 0.01 % OIL Apply 1 application   topically once a week. Apply to scalp and neck    [provider]  fluticasone (FLONASE) 50 MCG/ACT nasal spray Place 2 sprays into both nostrils daily as needed.    [provider]  folic acid (FOLVITE) 1 MG tablet Take 1 mg by mouth daily.    [provider]  lidocaine -prilocaine  (EMLA ) cream Apply to affected area once 01/25/24   Pasam, Avinash, MD  methotrexate (RHEUMATREX) 2.5 MG tablet Take 15 mg by mouth every Friday. 6 tablets 06/30/22   [provider]  mometasone (NASONEX) 50 MCG/ACT nasal spray Place 2 sprays into the nose daily as needed (Allergies).    [provider]  montelukast (SINGULAIR) 10 MG tablet Take 10 mg by mouth at bedtime. 06/11/20   [provider]  Multiple Vitamin (MULTIVITAMIN WITH MINERALS) TABS tablet Take 1 tablet by mouth daily.    [provider]  ondansetron  (ZOFRAN ) 4 MG tablet Take 1 tablet (4 mg total) by mouth every 8 (eight) hours as needed for nausea or vomiting. 01/23/24   Singh, Prashant K, MD  ondansetron  (ZOFRAN ) 8 MG tablet Take 1 tablet (8 mg total) by mouth every 8 (eight) hours as needed for nausea or vomiting. Start on the third day after chemotherapy. 01/25/24   Pasam, Chinita, MD  oxyCODONE  (OXY IR/ROXICODONE ) 5 MG immediate release tablet Take 1 tablet (5 mg total) by mouth every 8 (eight) hours as needed for severe pain (pain  score 7-10) (5 mg for pain score 4-6, 10 mg for pain score 7-10). 01/23/24   Singh, Prashant K, MD  pantoprazole  (PROTONIX ) 20 MG tablet Take 20 mg by mouth 2 (two) times daily. Patient taking differently: Take 20 mg by mouth 2 (two) times daily. Taking as needed 12/29/23   [provider]  polyethylene glycol powder (GLYCOLAX /MIRALAX ) 17 GM/SCOOP powder Take 17 g by mouth daily as needed. 01/23/24   Singh, Prashant K, MD  predniSONE  (DELTASONE ) 10 MG tablet Take 4 tablets (40 mg total) by mouth daily. 02/16/24   Keith, Kayla N, PA-C  prochlorperazine   (COMPAZINE ) 10 MG tablet Take 1 tablet (10 mg total) by mouth every 6 (six) hours as needed for nausea or vomiting. 02/27/24 05/27/24  Burton, Lacie K, NP    Allergies: Betadine [povidone iodine], Blueberry [vaccinium angustifolium], Milk-related compounds, Neosporin [neomycin-bacitracin zn-polymyx], Shellfish allergy, and Sulfa antibiotics    Review of Systems  Updated Vital Signs BP 120/76 (BP Location: Right Arm)   Pulse 73   Temp 98.1 F (36.7 C) (Oral)   Resp 17   Ht 5' 5 (1.651 m)   Wt 68 kg   LMP 12/26/2023 (Approximate) Comment: DOS UPREG NEGATIVE  SpO2 100%   BMI 24.96 kg/m   Physical Exam Vitals and nursing note reviewed.  Eyes:     Pupils: Pupils are equal, round, and reactive to light.  Abdominal:     General: Abdomen is flat.     Palpations: Abdomen is soft.  Musculoskeletal:        General: Normal range of motion.     Cervical back: Normal range of motion.  Neurological:     General: No focal deficit present.     Mental Status: She is alert.     Cranial Nerves: No cranial nerve deficit.     Motor: No weakness.     (all labs ordered are listed, but only abnormal results are displayed) Labs Reviewed - No data to display  EKG: None  Radiology: No results found.  {Document cardiac monitor, telemetry assessment procedure when appropriate:32947} Procedures   Medications Ordered in the ED  diphenhydrAMINE  (BENADRYL ) injection 25 mg (25 mg Intravenous Given 02/28/24 2109)  lactated ringers  bolus 1,000 mL (1,000 mLs Intravenous New Bag/Given 02/28/24 2109)      {Click here for ABCD2, HEART and other calculators REFRESH Note before signing:1}                              Medical Decision Making 48 year old female here today with jaw spasm and abnormal tongue movements while receiving medication.  Plan-patient symptoms sound like a likely dystonic reaction.  They have improved, patient still says that her jaw and tongue feel little bit off.   5-fluorouracil  not commonly known to cause dystonic reactions, however in some case reports does have some neurotoxicity.  Discussed this with the on-call oncologist Dr. Tina who agreed treating this like a possible dystonic reaction was appropriate.  Patient does not require any imaging, there is no lateralizing or focal deficits.  No indication for labs.  Reassessment 9:55 PM-patient feeling quite a bit better, is appropriate for discharge.  Risk Prescription drug management.     {Document critical care time when appropriate  Document review of labs and clinical decision tools ie CHADS2VASC2, etc  Document your independent review of radiology images and any outside records  Document your discussion with family members, caretakers and with consultants  Document social determinants of health affecting pt's care  Document your decision making why or why not admission, treatments were needed:32947:::1}   Final diagnoses:  Dystonia    ED Discharge Orders     None

## 2024-02-28 NOTE — Discharge Instructions (Addendum)
 Your symptoms are likely called dystonia.  They should not recur if you stop receiving infusions of your chemotherapy medication.  If you begin to feel as though you are having the symptoms, you can take Benadryl  at home.  Please follow-up with your oncologist at your scheduled appointment.

## 2024-02-28 NOTE — ED Triage Notes (Signed)
 Pt states she is currently receiving chemo tx. This is a new medication for pt. Pt had her chemo medication yesterday and experience jaw tightening and tremors. When pt took second dose today she had the same symptoms.  Pt states she here jaw feels tight and hurts.

## 2024-02-28 NOTE — ED Notes (Signed)
 Pt states she is a hard stick and if possible port can be accessed.

## 2024-02-28 NOTE — ED Triage Notes (Addendum)
 Patient on home chemo pump, just started new cycle yesterday and having reaction to same. Patient has clenched jaw, tongue and jaw tremors. Chemo pump stopped at this time. Patient reports no difficulty breathing or swallowing at this time.

## 2024-02-28 NOTE — ED Triage Notes (Signed)
 Pt denies any sob and hives at this time.

## 2024-02-29 ENCOUNTER — Inpatient Hospital Stay

## 2024-02-29 VITALS — BP 116/73 | HR 74 | Resp 17

## 2024-02-29 DIAGNOSIS — Z95828 Presence of other vascular implants and grafts: Secondary | ICD-10-CM

## 2024-02-29 DIAGNOSIS — C180 Malignant neoplasm of cecum: Secondary | ICD-10-CM | POA: Diagnosis not present

## 2024-02-29 MED ORDER — SODIUM CHLORIDE 0.9% FLUSH
10.0000 mL | Freq: Once | INTRAVENOUS | Status: AC | PRN
Start: 2024-02-29 — End: 2024-02-29
  Administered 2024-02-29: 10 mL

## 2024-02-29 MED ORDER — HEPARIN SOD (PORK) LOCK FLUSH 100 UNIT/ML IV SOLN
500.0000 [IU] | Freq: Once | INTRAVENOUS | Status: AC | PRN
Start: 2024-02-29 — End: 2024-02-29
  Administered 2024-02-29: 500 [IU]

## 2024-02-29 MED ORDER — SODIUM CHLORIDE 0.9 % IV SOLN
Freq: Once | INTRAVENOUS | Status: AC
Start: 2024-02-29 — End: 2024-02-29

## 2024-02-29 MED ORDER — SODIUM CHLORIDE 0.9 % IV SOLN: Freq: Once | INTRAVENOUS | Status: DC

## 2024-02-29 MED ORDER — ONDANSETRON HCL 4 MG/2ML IJ SOLN
8.0000 mg | Freq: Once | INTRAMUSCULAR | Status: AC
  Administered 2024-02-29: 8 mg via INTRAVENOUS
  Filled 2024-02-29: qty 4

## 2024-02-29 NOTE — Progress Notes (Signed)
 Pt to infusion today for pump stop and IVF. Possible reaction to 5FU, see ED notes from 02/28/24. Per note, Dr Tina and Powell, NP both aware and changing future treatment plan. 5FU still attached to [facility] but pump stopped. Pt states she is feeling much better today and has no additional problems. She states that she was given benadryl  in the ED and had resolution of symptoms. She does state she is feeling a little nauseated as she had not eaten today. Zofran  given and food/beverage offered. Removed 5FU and started IVF. VSS and ambulatory to lobby upon discharge.

## 2024-02-29 NOTE — Patient Instructions (Signed)

## 2024-03-06 ENCOUNTER — Other Ambulatory Visit: Payer: Self-pay

## 2024-03-06 ENCOUNTER — Observation Stay (HOSPITAL_COMMUNITY)
Admission: EM | Admit: 2024-03-06 | Discharge: 2024-03-09 | Disposition: A | Discharge status: Home / Self Care | Attending: Internal Medicine | Admitting: Internal Medicine

## 2024-03-06 ENCOUNTER — Telehealth: Payer: Self-pay

## 2024-03-06 ENCOUNTER — Encounter (HOSPITAL_COMMUNITY): Payer: Self-pay | Admitting: Emergency Medicine

## 2024-03-06 ENCOUNTER — Emergency Department (HOSPITAL_COMMUNITY)

## 2024-03-06 DIAGNOSIS — R112 Nausea with vomiting, unspecified: Secondary | ICD-10-CM | POA: Insufficient documentation

## 2024-03-06 DIAGNOSIS — Z7901 Long term (current) use of anticoagulants: Secondary | ICD-10-CM | POA: Diagnosis not present

## 2024-03-06 DIAGNOSIS — E876 Hypokalemia: Secondary | ICD-10-CM | POA: Insufficient documentation

## 2024-03-06 DIAGNOSIS — J019 Acute sinusitis, unspecified: Secondary | ICD-10-CM | POA: Diagnosis not present

## 2024-03-06 DIAGNOSIS — M069 Rheumatoid arthritis, unspecified: Secondary | ICD-10-CM | POA: Diagnosis not present

## 2024-03-06 DIAGNOSIS — K5900 Constipation, unspecified: Secondary | ICD-10-CM | POA: Diagnosis not present

## 2024-03-06 DIAGNOSIS — R509 Fever, unspecified: Principal | ICD-10-CM | POA: Insufficient documentation

## 2024-03-06 DIAGNOSIS — C189 Malignant neoplasm of colon, unspecified: Secondary | ICD-10-CM

## 2024-03-06 DIAGNOSIS — J45909 Unspecified asthma, uncomplicated: Secondary | ICD-10-CM | POA: Insufficient documentation

## 2024-03-06 DIAGNOSIS — R6510 Systemic inflammatory response syndrome (SIRS) of non-infectious origin without acute organ dysfunction: Secondary | ICD-10-CM | POA: Diagnosis not present

## 2024-03-06 DIAGNOSIS — Z85038 Personal history of other malignant neoplasm of large intestine: Secondary | ICD-10-CM | POA: Diagnosis not present

## 2024-03-06 DIAGNOSIS — Z95828 Presence of other vascular implants and grafts: Secondary | ICD-10-CM

## 2024-03-06 DIAGNOSIS — R110 Nausea: Secondary | ICD-10-CM | POA: Diagnosis present

## 2024-03-06 LAB — CBC WITH DIFFERENTIAL/PLATELET
Abs Immature Granulocytes: 0.01 K/uL (ref 0.00–0.07)
Basophils Absolute: 0 K/uL (ref 0.0–0.1)
Basophils Relative: 0 %
Eosinophils Absolute: 0 K/uL (ref 0.0–0.5)
Eosinophils Relative: 1 %
HCT: 34 % — ABNORMAL LOW (ref 36.0–46.0)
Hemoglobin: 11.1 g/dL — ABNORMAL LOW (ref 12.0–15.0)
Immature Granulocytes: 0 %
Lymphocytes Relative: 6 %
Lymphs Abs: 0.3 K/uL — ABNORMAL LOW (ref 0.7–4.0)
MCH: 27.9 pg (ref 26.0–34.0)
MCHC: 32.6 g/dL (ref 30.0–36.0)
MCV: 85.4 fL (ref 80.0–100.0)
Monocytes Absolute: 0.2 K/uL (ref 0.1–1.0)
Monocytes Relative: 4 %
Neutro Abs: 4.1 K/uL (ref 1.7–7.7)
Neutrophils Relative %: 89 %
Platelets: 251 K/uL (ref 150–400)
RBC: 3.98 MIL/uL (ref 3.87–5.11)
RDW: 16.3 % — ABNORMAL HIGH (ref 11.5–15.5)
WBC: 4.7 K/uL (ref 4.0–10.5)
nRBC: 0 % (ref 0.0–0.2)

## 2024-03-06 LAB — COMPREHENSIVE METABOLIC PANEL WITH GFR
ALT: 19 U/L (ref 0–44)
AST: 18 U/L (ref 15–41)
Albumin: 3.8 g/dL (ref 3.5–5.0)
Alkaline Phosphatase: 42 U/L (ref 38–126)
Anion gap: 12 (ref 5–15)
BUN: 14 mg/dL (ref 6–20)
CO2: 24 mmol/L (ref 22–32)
Calcium: 9.2 mg/dL (ref 8.9–10.3)
Chloride: 101 mmol/L (ref 98–111)
Creatinine, Ser: 0.92 mg/dL (ref 0.44–1.00)
GFR, Estimated: >60 mL/min (ref >60)
Glucose, Bld: 102 mg/dL — ABNORMAL HIGH (ref 70–99)
Potassium: 3.2 mmol/L — ABNORMAL LOW (ref 3.5–5.1)
Sodium: 137 mmol/L (ref 135–145)
Total Bilirubin: 0.9 mg/dL (ref 0.0–1.2)
Total Protein: 7.7 g/dL (ref 6.5–8.1)

## 2024-03-06 LAB — URINALYSIS, W/ REFLEX TO CULTURE (INFECTION SUSPECTED)
Bilirubin Urine: NEGATIVE
Glucose, UA: NEGATIVE mg/dL
Hgb urine dipstick: NEGATIVE
Ketones, ur: NEGATIVE mg/dL
Leukocytes,Ua: NEGATIVE
Nitrite: NEGATIVE
Protein, ur: NEGATIVE mg/dL
Specific Gravity, Urine: 1.03 (ref 1.005–1.030)
pH: 7 (ref 5.0–8.0)

## 2024-03-06 LAB — RESP PANEL BY RT-PCR (RSV, FLU A&B, COVID)  RVPGX2
Influenza A by PCR: NEGATIVE
Influenza B by PCR: NEGATIVE
Resp Syncytial Virus by PCR: NEGATIVE
SARS Coronavirus 2 by RT PCR: NEGATIVE

## 2024-03-06 LAB — I-STAT CG4 LACTIC ACID, ED: Lactic Acid, Venous: 0.9 mmol/L (ref 0.5–1.9)

## 2024-03-06 LAB — PROTIME-INR
INR: 0.9 (ref 0.8–1.2)
Prothrombin Time: 12.7 s (ref 11.4–15.2)

## 2024-03-06 LAB — HCG, SERUM, QUALITATIVE: Preg, Serum: NEGATIVE

## 2024-03-06 MED ORDER — IPRATROPIUM-ALBUTEROL 0.5-2.5 (3) MG/3ML IN SOLN: 3.0000 mL | Freq: Four times a day (QID) | RESPIRATORY_TRACT | Status: DC | PRN

## 2024-03-06 MED ORDER — POTASSIUM CHLORIDE CRYS ER 20 MEQ PO TBCR
40.0000 meq | EXTENDED_RELEASE_TABLET | Freq: Once | ORAL | Status: AC
Start: 2024-03-06 — End: 2024-03-06
  Administered 2024-03-06: 40 meq via ORAL
  Filled 2024-03-06: qty 2

## 2024-03-06 MED ORDER — FOLIC ACID 1 MG PO TABS
1.0000 mg | ORAL_TABLET | Freq: Every day | ORAL | Status: DC
  Administered 2024-03-06: 1 mg via ORAL
  Filled 2024-03-06 (×2): qty 1

## 2024-03-06 MED ORDER — ONDANSETRON HCL 4 MG/2ML IJ SOLN
4.0000 mg | Freq: Once | INTRAMUSCULAR | Status: AC
  Administered 2024-03-06: 4 mg via INTRAVENOUS
  Filled 2024-03-06: qty 2

## 2024-03-06 MED ORDER — VANCOMYCIN HCL 1500 MG/300ML IV SOLN
1500.0000 mg | INTRAVENOUS | Status: DC
  Administered 2024-03-07: 1500 mg via INTRAVENOUS
  Filled 2024-03-06: qty 300

## 2024-03-06 MED ORDER — POLYETHYLENE GLYCOL 3350 17 G PO PACK: 17.0000 g | PACK | Freq: Every day | ORAL | Status: DC

## 2024-03-06 MED ORDER — SODIUM CHLORIDE 0.9 % IV BOLUS
1000.0000 mL | Freq: Once | INTRAVENOUS | Status: AC
  Administered 2024-03-06: 1000 mL via INTRAVENOUS

## 2024-03-06 MED ORDER — BISACODYL 5 MG PO TBEC: 5.0000 mg | DELAYED_RELEASE_TABLET | Freq: Every day | ORAL | Status: DC | PRN

## 2024-03-06 MED ORDER — SENNOSIDES-DOCUSATE SODIUM 8.6-50 MG PO TABS
1.0000 | ORAL_TABLET | Freq: Every evening | ORAL | Status: DC | PRN
Start: 2024-03-06 — End: 2024-03-07

## 2024-03-06 MED ORDER — ACETAMINOPHEN 10 MG/ML IV SOLN
1000.0000 mg | Freq: Four times a day (QID) | INTRAVENOUS | Status: AC
  Administered 2024-03-06: 1000 mg via INTRAVENOUS
  Filled 2024-03-06 (×2): qty 100

## 2024-03-06 MED ORDER — MONTELUKAST SODIUM 10 MG PO TABS
10.0000 mg | ORAL_TABLET | Freq: Every day | ORAL | Status: DC
  Administered 2024-03-07 – 2024-03-08 (×3): 10 mg via ORAL
  Filled 2024-03-06 (×3): qty 1

## 2024-03-06 MED ORDER — PANTOPRAZOLE SODIUM 20 MG PO TBEC
20.0000 mg | DELAYED_RELEASE_TABLET | Freq: Two times a day (BID) | ORAL | Status: DC
Start: 2024-03-06 — End: 2024-03-09
  Filled 2024-03-06 (×6): qty 1

## 2024-03-06 MED ORDER — DOCUSATE SODIUM 100 MG PO CAPS
200.0000 mg | ORAL_CAPSULE | Freq: Two times a day (BID) | ORAL | Status: DC
  Administered 2024-03-06: 200 mg via ORAL
  Filled 2024-03-06 (×3): qty 2

## 2024-03-06 MED ORDER — ONDANSETRON HCL 4 MG/2ML IJ SOLN
4.0000 mg | Freq: Four times a day (QID) | INTRAMUSCULAR | Status: DC | PRN
  Administered 2024-03-07: 4 mg via INTRAVENOUS
  Filled 2024-03-06: qty 2

## 2024-03-06 MED ORDER — FAMOTIDINE 20 MG PO TABS: 20.0000 mg | ORAL_TABLET | Freq: Two times a day (BID) | ORAL | Status: DC | PRN

## 2024-03-06 MED ORDER — ENOXAPARIN SODIUM 40 MG/0.4ML IJ SOSY
40.0000 mg | PREFILLED_SYRINGE | INTRAMUSCULAR | Status: DC
  Administered 2024-03-06 – 2024-03-07 (×2): 40 mg via SUBCUTANEOUS
  Filled 2024-03-06 (×3): qty 0.4

## 2024-03-06 MED ORDER — VANCOMYCIN HCL IN DEXTROSE 1-5 GM/200ML-% IV SOLN
1000.0000 mg | Freq: Once | INTRAVENOUS | Status: AC
  Administered 2024-03-06: 1000 mg via INTRAVENOUS
  Filled 2024-03-06: qty 200

## 2024-03-06 MED ORDER — PIPERACILLIN-TAZOBACTAM 3.375 G IVPB
3.3750 g | Freq: Once | INTRAVENOUS | Status: AC
  Administered 2024-03-06: 3.375 g via INTRAVENOUS
  Filled 2024-03-06: qty 50

## 2024-03-06 MED ORDER — VITAMIN B-COMPLEX PO TABS: 1.0000 | ORAL_TABLET | Freq: Every day | ORAL | Status: DC

## 2024-03-06 MED ORDER — LACTATED RINGERS IV SOLN: INTRAVENOUS | Status: AC

## 2024-03-06 MED ORDER — ENOXAPARIN SODIUM 40 MG/0.4ML IJ SOSY: 40.0000 mg | PREFILLED_SYRINGE | INTRAMUSCULAR | Status: DC

## 2024-03-06 MED ORDER — ACETAMINOPHEN 650 MG RE SUPP: 650.0000 mg | Freq: Four times a day (QID) | RECTAL | Status: DC | PRN

## 2024-03-06 MED ORDER — DIPHENHYDRAMINE HCL 25 MG PO CAPS: 25.0000 mg | ORAL_CAPSULE | Freq: Four times a day (QID) | ORAL | Status: DC | PRN

## 2024-03-06 MED ORDER — PIPERACILLIN-TAZOBACTAM 3.375 G IVPB
3.3750 g | Freq: Three times a day (TID) | INTRAVENOUS | Status: DC
  Administered 2024-03-06 – 2024-03-09 (×8): 3.375 g via INTRAVENOUS
  Filled 2024-03-06 (×8): qty 50

## 2024-03-06 MED ORDER — LORATADINE 10 MG PO TABS
10.0000 mg | ORAL_TABLET | Freq: Every day | ORAL | Status: DC
  Administered 2024-03-06 – 2024-03-08 (×3): 10 mg via ORAL
  Filled 2024-03-06 (×3): qty 1

## 2024-03-06 MED ORDER — ACETAMINOPHEN 325 MG PO TABS
650.0000 mg | ORAL_TABLET | Freq: Four times a day (QID) | ORAL | Status: DC | PRN
  Administered 2024-03-07: 650 mg via ORAL
  Filled 2024-03-06: qty 2

## 2024-03-06 MED ORDER — B COMPLEX-C PO TABS
1.0000 | ORAL_TABLET | Freq: Every day | ORAL | Status: DC
  Administered 2024-03-07 – 2024-03-08 (×2): 1 via ORAL
  Filled 2024-03-06 (×2): qty 1

## 2024-03-06 MED ORDER — ONDANSETRON HCL 4 MG PO TABS: 4.0000 mg | ORAL_TABLET | Freq: Four times a day (QID) | ORAL | Status: DC | PRN

## 2024-03-06 MED ORDER — IOHEXOL 300 MG/ML  SOLN
100.0000 mL | Freq: Once | INTRAMUSCULAR | Status: AC | PRN
  Administered 2024-03-06: 100 mL via INTRAVENOUS

## 2024-03-06 MED ORDER — ADULT MULTIVITAMIN W/MINERALS CH
1.0000 | ORAL_TABLET | Freq: Every day | ORAL | Status: DC
  Administered 2024-03-06: 1 via ORAL
  Filled 2024-03-06 (×2): qty 1

## 2024-03-06 NOTE — Telephone Encounter (Signed)
 Patient's husband called back this afternoon stating that the patient had received an enema after getting the ok to administer from the surgeon's office.  The patient had experienced chills earlier in the day and was placed on a warming blanket. As the day has progressed, she has started to experience vomiting and now a fever at 101.5. Husband states they have cut off the warming blanket. After discussion with Lacie, the patient and husband was instructed to be seen in the emergency department to have her evaluated. Husband verbalized understanding.

## 2024-03-06 NOTE — Sepsis Progress Note (Signed)
 Elink will follow per sepsis protocol.

## 2024-03-06 NOTE — H&P (Signed)
 History and Physical  Arnita Koons FMW:[id number] DOB: [birth year] DOA: 03/06/2024  PCP: Sun, Vyvyan, MD   Chief Complaint: Fever, nausea and vomiting  HPI: Sherry Abbott is a 48 y.o. female with medical history significant for rheumatoid arthritis on methotrexate, hidradenitis suppurativa, asthma, allergic rhinitis, migraines, invasive adenocarcinoma of the colon, s/p hemicolectomy on 01/19/2024 who presents to the ED for evaluation of fever, nausea and vomiting.  Patient reports she woke up with nausea and vomiting this morning.  She also had a fever up to 102 with associated chills.  She called her oncologist who was advised to present to the ED for further evaluation.  Reports she has been eating a low fiber diet and ate some peas, rice and salmon yesterday but these are not new foods for her since her colon surgery. States she had constipation for 5 days, took Fleet enema this morning and had 2 good bowel movement that made her feel better.  She reports mild nasal drainage yesterday from her allergies but denies any chest pain, shortness of breath, abdominal pain, diarrhea, headache, dizziness, cough or urinary symptoms.  ED Course: Initial vitals show temp 102, RR 16, HR 131, BP 126/78, SpO2 98% on room air. Initial labs significant for K+ 3.2, glucose 102, normal kidney function, WBC 4.7, Hgb 11.1, normal lactic acid. EKG shows sinus tach. CXR shows no active disease.  CT A/P with no acute intra-abdominal pelvic pathology. Pt received IV fluid, IV Zofran , IV vancomycin  and IV Zosyn . [facility] was consulted for admission.   Review of Systems: Please see HPI for pertinent positives and negatives. A complete 10 system review of systems are otherwise negative.  Past Medical History:  Diagnosis Date   Allergic rhinitis    Allergic to food    blueberry   Allergic to shellfish    Asthma    Cancer (HCC)    Dermatographic urticaria    Hidradenitis suppurativa    Labral tear of right  hip joint    surgery   Low back pain    Nausea and vomiting 01/17/2024   Other chronic allergic conjunctivitis    Polyarthralgia    Past Surgical History:  Procedure Laterality Date   CESAREAN SECTION  2008   DERMOID CYST  EXCISION Right 03/2022   right arm in dermatologist office   LABRAL REPAIR Right 04/2021   right hip   LAPAROSCOPIC PARTIAL COLECTOMY Right 01/19/2024   Procedure: LAPAROSCOPIC PARTIAL COLECTOMY;  Surgeon: Dasie Leonor CROME, MD;  Location: [facility] OR;  Service: General;  Laterality: Right;  LAPAROSCOPIC ASSISTED RIGHT HEMI COLECTOMY   PORTACATH PLACEMENT N/A 02/03/2024   Procedure: INSERTION PORTACATH RIGHT SUBCLAVIAN;  Surgeon: Dasie Leonor CROME, MD;  Location: [facility] OR;  Service: General;  Laterality: N/A;   ROTATOR CUFF REPAIR Right    2015   Social History:  reports that she has never smoked. She has never used smokeless tobacco. She reports that she does not drink alcohol and does not use drugs.  Allergies  Allergen Reactions   Betadine [Povidone Iodine] Rash   Blueberry [Vaccinium Angustifolium] Itching   Milk-Related Compounds Anaphylaxis and Other (See Comments)    No milk or other dairy products   Neosporin [Neomycin-Bacitracin Zn-Polymyx] Other (See Comments)    Blistering rash   Povidone-Iodine Dermatitis   Shellfish Allergy Anaphylaxis   Sulfa Antibiotics Rash, Dermatitis and Other (See Comments)   Neomycin Other (See Comments)   Other Other (See Comments)    5-Fluorouracil  (5-FU) = Bad muscle spasms in  the jaws and tongue    Family History  Problem Relation Age of Onset   Migraines Mother    Pulmonary disease Mother    Cancer Father      Prior to Admission medications   Medication Sig Start Date End Date Taking? Authorizing Provider  B Complex Vitamins (VITAMIN B-COMPLEX) TABS Take 1 tablet by mouth daily at 6 (six) AM. 06/23/20   [provider]  cetirizine (ZYRTEC ALLERGY) 10 MG tablet Take 10 mg by mouth daily.    [provider]  dexamethasone  (DECADRON ) 4 MG tablet Take 2 tablets (8 mg total) by mouth daily. Start the day after chemotherapy for 2 days. Take with food. 01/25/24   Pasam, Chinita, MD  diazepam  (VALIUM ) 5 MG tablet Take 1 tablet (5 mg total) by mouth 2 (two) times daily. 02/16/24   Keith, Kayla N, PA-C  diphenhydrAMINE  (BENADRYL ) 25 MG tablet Take 1 tablet (25 mg total) by mouth every 6 (six) hours as needed. 02/16/24   Francis Ileana SAILOR, PA-C  docusate sodium  (COLACE) 100 MG capsule Take 2 capsules (200 mg total) by mouth 2 (two) times daily as needed for mild constipation. 01/23/24   Singh, Prashant K, MD  docusate sodium  (COLACE) 100 MG capsule Take 2 capsules (200 mg total) by mouth every 12 (twelve) hours. 02/16/24   Keith, Kayla N, PA-C  famotidine  (PEPCID ) 20 MG tablet Take 1 tablet (20 mg total) by mouth 2 (two) times daily. 02/16/24   Keith, Kayla N, PA-C  Fluocinolone Acetonide Scalp 0.01 % OIL Apply 1 application  topically once a week. Apply to scalp and neck    [provider]  fluticasone (FLONASE) 50 MCG/ACT nasal spray Place 2 sprays into both nostrils daily as needed.    [provider]  folic acid  (FOLVITE ) 1 MG tablet Take 1 mg by mouth daily.    [provider]  lidocaine -prilocaine  (EMLA ) cream Apply to affected area once 01/25/24   Pasam, Avinash, MD  methotrexate (RHEUMATREX) 2.5 MG tablet Take 15 mg by mouth every Friday. 6 tablets 06/30/22   [provider]  mometasone (NASONEX) 50 MCG/ACT nasal spray Place 2 sprays into the nose daily as needed (Allergies).    [provider]  montelukast  (SINGULAIR ) 10 MG tablet Take 10 mg by mouth at bedtime. 06/11/20   [provider]  Multiple Vitamin (MULTIVITAMIN WITH MINERALS) TABS tablet Take 1 tablet by mouth daily.    [provider]  ondansetron  (ZOFRAN ) 4 MG tablet Take 1 tablet (4 mg total) by mouth every 8 (eight) hours as needed for nausea or vomiting. 01/23/24   Singh, Prashant K, MD   ondansetron  (ZOFRAN ) 8 MG tablet Take 1 tablet (8 mg total) by mouth every 8 (eight) hours as needed for nausea or vomiting. Start on the third day after chemotherapy. 01/25/24   Pasam, Chinita, MD  oxyCODONE  (OXY IR/ROXICODONE ) 5 MG immediate release tablet Take 1 tablet (5 mg total) by mouth every 8 (eight) hours as needed for severe pain (pain score 7-10) (5 mg for pain score 4-6, 10 mg for pain score 7-10). 01/23/24   Singh, Prashant K, MD  pantoprazole  (PROTONIX ) 20 MG tablet Take 20 mg by mouth 2 (two) times daily. Patient taking differently: Take 20 mg by mouth 2 (two) times daily. Taking as needed 12/29/23   [provider]  polyethylene glycol powder (GLYCOLAX /MIRALAX ) 17 GM/SCOOP powder Take 17 g by mouth daily as needed. 01/23/24   Singh, Prashant K, MD  predniSONE  (DELTASONE ) 10 MG tablet Take 4 tablets (40 mg total) by mouth daily. 02/16/24   Keith, Kayla N, PA-C  prochlorperazine  (COMPAZINE ) 10 MG tablet Take 1 tablet (10 mg total) by mouth every 6 (six) hours as needed for nausea or vomiting. 02/27/24 05/27/24  Burton, Lacie K, NP    Physical Exam: BP (!) 126/96   Pulse (!) 101   Temp 100.1 F (37.8 C) (Oral)   Resp 15   Ht 5' 5 (1.651 m)   Wt 67.6 kg   SpO2 100%   BMI 24.79 kg/m  General: Pleasant, chronically ill and thin appearing middle-aged woman laying in bed. No acute distress. HEENT: /AT. Anicteric sclera Chest: Port-A-Cath on right chest wall with no signs of surrounding infection CV: Tachycardic. Regular rhythm. No murmurs, rubs, or gallops. No LE edema Pulmonary: Lungs CTAB. Normal effort. No wheezing or rales. Abdominal: Soft, nondistended. Mild tenderness around incision site. Normal bowel sounds. Extremities: Palpable radial and DP pulses. Normal ROM. Skin: Warm and dry. No obvious rash or lesions. Neuro: A&Ox3. Moves all extremities. Normal sensation to light touch. No focal deficit. Psych: Normal mood and affect          Labs on Admission:   Basic Metabolic Panel: Recent Labs  Lab 03/06/24 1418  NA 137  K 3.2*  CL 101  CO2 24  GLUCOSE 102*  BUN 14  CREATININE 0.92  CALCIUM  9.2   Liver Function Tests: Recent Labs  Lab 03/06/24 1418  AST 18  ALT 19  ALKPHOS 42  BILITOT 0.9  PROT 7.7  ALBUMIN  3.8   No results for input(s): LIPASE, AMYLASE in the last 168 hours. No results for input(s): AMMONIA in the last 168 hours. CBC: Recent Labs  Lab 03/06/24 1418  WBC 4.7  NEUTROABS 4.1  HGB 11.1*  HCT 34.0*  MCV 85.4  PLT 251   Cardiac Enzymes: No results for input(s): CKTOTAL, CKMB, CKMBINDEX, TROPONINI in the last 168 hours. BNP (last 3 results) No results for input(s): BNP in the last 8760 hours.  ProBNP (last 3 results) No results for input(s): PROBNP in the last 8760 hours.  CBG: No results for input(s): GLUCAP in the last 168 hours.  Radiological Exams on Admission: CT ABDOMEN PELVIS W CONTRAST Result Date: 03/06/2024 CLINICAL DATA:  History of colorectal cancer status post resection. EXAM: CT ABDOMEN AND PELVIS WITH CONTRAST TECHNIQUE: Multidetector CT imaging of the abdomen and pelvis was performed using the standard protocol following bolus administration of intravenous contrast. RADIATION DOSE REDUCTION: This exam was performed according to the departmental dose-optimization program which includes automated exposure control, adjustment of the mA and/or kV according to patient size and/or use of iterative reconstruction technique. CONTRAST:  OMNIPAQUE  IOHEXOL  300 MG/ML  SOLN COMPARISON:  CT abdomen pelvis dated 01/16/2024. FINDINGS: Lower chest: The visualized lung bases are clear. No intra-abdominal free air or free fluid. Hepatobiliary: The liver is unremarkable. The gallbladder is unremarkable. No biliary dilatation. Pancreas: Unremarkable. No pancreatic ductal dilatation or surrounding inflammatory changes. Spleen: Normal in size without focal abnormality. Adrenals/Urinary  Tract: The adrenal glands unremarkable the kidneys, visualized ureters, and urinary bladder appear unremarkable. Stomach/Bowel: Right hemicolectomy with ileocolic anastomosis. There is no bowel obstruction or active inflammation. Vascular/Lymphatic: The abdominal aorta and IVC unremarkable. No portal venous gas. There is no adenopathy. Reproductive: The uterus is anteverted. No suspicious adnexal masses. Other: None Musculoskeletal: No acute or significant osseous findings. IMPRESSION: 1. No acute intra-abdominal or pelvic pathology. No evidence of recurrent  or metastatic disease. 2. Right hemicolectomy with ileocolic anastomosis. No bowel obstruction. The Electronically Signed   By: Vanetta Chou M.D.   On: 03/06/2024 17:19   DG Chest 2 View Result Date: 03/06/2024 CLINICAL DATA:  Questionable sepsis EXAM: CHEST - 2 VIEW COMPARISON:  None Available. FINDINGS: Port in the anterior chest wall with tip in distal SVC. Normal mediastinum and cardiac silhouette. Normal pulmonary vasculature. No evidence of effusion, infiltrate, or pneumothorax. No acute bony abnormality. IMPRESSION: No acute cardiopulmonary process. Electronically Signed   By: Jackquline Boxer M.D.   On: 03/06/2024 15:35   Assessment/Plan Rokhaya Denise Ishaq is a 48 y.o. female with medical history significant for rheumatoid arthritis on methotrexate, hidradenitis suppurativa, asthma, allergic rhinitis, migraines, invasive adenocarcinoma of the colon, s/p hemicolectomy on 01/19/2024 who presents to the ED for evaluation of fever, nausea and vomiting and admitted for SIRS and sepsis rule out.   # SIRS # Fever # Nausea and vomiting - Patient presented with acute onset of fever, nausea and vomiting - Patient meets SIRS criteria with fever and tachycardia - So far workup has been unremarkable with normal CT A/P, CXR, urinalysis, negative flu, RSV and COVID test.  No headache, neurological symptoms or meningeal signs to indicate meningitis  or encephalitis. - Presentation could be due to recent chemotherapy however, will need to rule out sepsis or bacteremia - Due to her immunocompromise state, will continue empirical antibiotics with vancomycin  and Zosyn  for now - Continue IV LR 150 cc/h - Follow-up blood culture and full RVP - Trend CBC, fever curve  # Colon cancer - S/p hemicolectomy on adjuvant chemo (FOLFOX q. 14 days x 6 months) - Completed last chemotherapy on 7/21 - Follow-up with oncology in the outpatient  # Rheumatoid arthritis - On weekly methotrexate however currently on hold for now  # Asthma - Stable and chronic - As needed DuoNebs  # Allergic rhinitis - Continue Singulair  and loratadine   # Constipation - Reports 5 days of no bowel movement, attempted Fleet enema this morning followed by 2 bowel movements - Continue daily Colace and MiraLAX   DVT prophylaxis: Lovenox      Code Status: Full Code  Consults called: None  Family Communication: Discussed admission with spouse at bedside  Severity of Illness: The appropriate patient status for this patient is OBSERVATION. Observation status is judged to be reasonable and necessary in order to provide the required intensity of service to ensure the patient's safety. The patient's presenting symptoms, physical exam findings, and initial radiographic and laboratory data in the context of their medical condition is felt to place them at decreased risk for further clinical deterioration. Furthermore, it is anticipated that the patient will be medically stable for discharge from the hospital within 2 midnights of admission.   Level of care: Telemetry   This record has been created using Conservation officer, historic buildings. Errors have been sought and corrected, but may not always be located. Such creation errors do not reflect on the standard of care.   Lou Claretta HERO, MD 03/06/2024, 8:03 PM [facility] Pager: [phone] Isaiah 41:10   If  7PM-7AM, please contact night-coverage [email or web address] Password [email or web address]

## 2024-03-06 NOTE — ED Provider Notes (Signed)
 [place] EMERGENCY DEPARTMENT AT [facility] Provider Note   CSN: [id number] Arrival date & time: 03/06/24  1348     Patient presents with: Fever and Emesis   Sherry Abbott is a 48 y.o. female.  With a history of colon cancer status post resection who presents to the ED given concern for acute infection.  Patient underwent chemotherapy on July 21.  Status post partial colectomy on June 12.  Recent constipation at home but was able to have a bowel movement today.  Nausea vomiting fevers chills began at home today.  Instructed to come in by her oncology team for further evaluation given acute infection concerns.  No chest pain or shortness of breath.    Fever Associated symptoms: vomiting   Emesis Associated symptoms: fever        Prior to Admission medications   Medication Sig Start Date End Date Taking? Authorizing Provider  B Complex Vitamins (VITAMIN B-COMPLEX) TABS Take 1 tablet by mouth daily at 6 (six) AM. 06/23/20   [provider]  cetirizine (ZYRTEC ALLERGY) 10 MG tablet Take 10 mg by mouth daily.    [provider]  dexamethasone  (DECADRON ) 4 MG tablet Take 2 tablets (8 mg total) by mouth daily. Start the day after chemotherapy for 2 days. Take with food. 01/25/24   Pasam, Chinita, MD  diazepam  (VALIUM ) 5 MG tablet Take 1 tablet (5 mg total) by mouth 2 (two) times daily. 02/16/24   Keith, Kayla N, PA-C  diphenhydrAMINE  (BENADRYL ) 25 MG tablet Take 1 tablet (25 mg total) by mouth every 6 (six) hours as needed. 02/16/24   Francis Ileana SAILOR, PA-C  docusate sodium  (COLACE) 100 MG capsule Take 2 capsules (200 mg total) by mouth 2 (two) times daily as needed for mild constipation. 01/23/24   Singh, Prashant K, MD  docusate sodium  (COLACE) 100 MG capsule Take 2 capsules (200 mg total) by mouth every 12 (twelve) hours. 02/16/24   Keith, Kayla N, PA-C  famotidine  (PEPCID ) 20 MG tablet Take 1 tablet (20 mg total) by mouth 2 (two) times daily. 02/16/24    Keith, Kayla N, PA-C  Fluocinolone Acetonide Scalp 0.01 % OIL Apply 1 application  topically once a week. Apply to scalp and neck    [provider]  fluticasone (FLONASE) 50 MCG/ACT nasal spray Place 2 sprays into both nostrils daily as needed.    [provider]  folic acid  (FOLVITE ) 1 MG tablet Take 1 mg by mouth daily.    [provider]  lidocaine -prilocaine  (EMLA ) cream Apply to affected area once 01/25/24   Pasam, Avinash, MD  methotrexate (RHEUMATREX) 2.5 MG tablet Take 15 mg by mouth every Friday. 6 tablets 06/30/22   [provider]  mometasone (NASONEX) 50 MCG/ACT nasal spray Place 2 sprays into the nose daily as needed (Allergies).    [provider]  montelukast  (SINGULAIR ) 10 MG tablet Take 10 mg by mouth at bedtime. 06/11/20   [provider]  Multiple Vitamin (MULTIVITAMIN WITH MINERALS) TABS tablet Take 1 tablet by mouth daily.    [provider]  ondansetron  (ZOFRAN ) 4 MG tablet Take 1 tablet (4 mg total) by mouth every 8 (eight) hours as needed for nausea or vomiting. 01/23/24   Singh, Prashant K, MD  ondansetron  (ZOFRAN ) 8 MG tablet Take 1 tablet (8 mg total) by mouth every 8 (eight) hours as needed for nausea or vomiting. Start on the third day after chemotherapy. 01/25/24   Autumn Chinita, MD  oxyCODONE  (OXY IR/ROXICODONE ) 5 MG immediate release tablet Take 1 tablet (5 mg total) by mouth every 8 (eight) hours as needed for severe pain (pain score 7-10) (5 mg for pain score 4-6, 10 mg for pain score 7-10). 01/23/24   Singh, Prashant K, MD  pantoprazole  (PROTONIX ) 20 MG tablet Take 20 mg by mouth 2 (two) times daily. Patient taking differently: Take 20 mg by mouth 2 (two) times daily. Taking as needed 12/29/23   [provider]  polyethylene glycol powder (GLYCOLAX /MIRALAX ) 17 GM/SCOOP powder Take 17 g by mouth daily as needed. 01/23/24   Singh, Prashant K, MD  predniSONE  (DELTASONE ) 10 MG tablet Take 4 tablets (40 mg  total) by mouth daily. 02/16/24   Keith, Kayla N, PA-C  prochlorperazine  (COMPAZINE ) 10 MG tablet Take 1 tablet (10 mg total) by mouth every 6 (six) hours as needed for nausea or vomiting. 02/27/24 05/27/24  Burton, Lacie K, NP    Allergies: Betadine [povidone iodine], Blueberry [vaccinium angustifolium], Milk-related compounds, Neosporin [neomycin-bacitracin zn-polymyx], Shellfish allergy, and Sulfa antibiotics    Review of Systems  Constitutional:  Positive for fever.  Gastrointestinal:  Positive for vomiting.    Updated Vital Signs BP 126/73   Pulse (!) 101   Temp (!) 102 F (38.9 C) (Oral)   Resp 17   Ht 5' 5 (1.651 m)   Wt 67.6 kg   SpO2 100%   BMI 24.79 kg/m   Physical Exam Vitals and nursing note reviewed.  HENT:     Head: Normocephalic and atraumatic.  Eyes:     Pupils: Pupils are equal, round, and reactive to light.  Cardiovascular:     Rate and Rhythm: Normal rate and regular rhythm.  Pulmonary:     Effort: Pulmonary effort is normal.     Breath sounds: Normal breath sounds.  Abdominal:     Palpations: Abdomen is soft.     Tenderness: There is no abdominal tenderness.     Comments: Small periumbilical surgical incision with overlying glue no evidence of erythema or induration  Skin:    General: Skin is warm and dry.  Neurological:     Mental Status: She is alert.  Psychiatric:        Mood and Affect: Mood normal.     (all labs ordered are listed, but only abnormal results are displayed) Labs Reviewed  COMPREHENSIVE METABOLIC PANEL WITH GFR - Abnormal; Notable for the following components:      Result Value   Potassium 3.2 (*)    Glucose, Bld 102 (*)    All other components within normal limits  CBC WITH DIFFERENTIAL/PLATELET - Abnormal; Notable for the following components:   Hemoglobin 11.1 (*)    HCT 34.0 (*)    RDW 16.3 (*)    Lymphs Abs 0.3 (*)    All other components within normal limits  RESP PANEL BY RT-PCR (RSV, FLU A&B, COVID)  [id number]   CULTURE, BLOOD (ROUTINE X 2)  CULTURE, BLOOD (ROUTINE X 2)  PROTIME-INR  HCG, SERUM, QUALITATIVE  URINALYSIS, W/ REFLEX TO CULTURE (INFECTION SUSPECTED)  I-STAT CG4 LACTIC ACID, ED  I-STAT CG4 LACTIC ACID, ED  I-STAT CG4 LACTIC ACID, ED  I-STAT CG4 LACTIC ACID, ED    EKG: EKG Interpretation Date/Time:  Tuesday March 06 2024 15:39:58 EDT Ventricular Rate:  106 PR Interval:  180 QRS Duration:  98 QT Interval:  329 QTC Calculation: 437 R Axis:   72  Text Interpretation: Sinus tachycardia Borderline T abnormalities, anterior leads Confirmed  by Pamella Sharper [phone]) on 03/06/2024 4:36:35 PM  Radiology: [facility] Chest 2 View Result Date: 03/06/2024 CLINICAL DATA:  Questionable sepsis EXAM: CHEST - 2 VIEW COMPARISON:  None Available. FINDINGS: Port in the anterior chest wall with tip in distal SVC. Normal mediastinum and cardiac silhouette. Normal pulmonary vasculature. No evidence of effusion, infiltrate, or pneumothorax. No acute bony abnormality. IMPRESSION: No acute cardiopulmonary process. Electronically Signed   By: Jackquline Boxer M.D.   On: 03/06/2024 15:35     Procedures   Medications Ordered in the ED  lactated ringers  infusion (has no administration in time range)  piperacillin -tazobactam (ZOSYN ) IVPB 3.375 g (3.375 g Intravenous New Bag/Given 03/06/24 1502)  acetaminophen  (OFIRMEV ) IV 1,000 mg (has no administration in time range)  sodium chloride  0.9 % bolus 1,000 mL (has no administration in time range)  sodium chloride  0.9 % bolus 1,000 mL (1,000 mLs Intravenous New Bag/Given 03/06/24 1500)  vancomycin  (VANCOCIN ) IVPB 1000 mg/200 mL premix (1,000 mg Intravenous New Bag/Given 03/06/24 1512)  ondansetron  (ZOFRAN ) injection 4 mg (4 mg Intravenous Given 03/06/24 1509)  iohexol  (OMNIPAQUE ) 300 MG/ML solution 100 mL (100 mLs Intravenous Contrast Given 03/06/24 1650)    Clinical Course as of 03/06/24 1704  Tue Mar 06, 2024  1635 Reevaluated patient.  Blood pressure remained stable  at this time.  After her second liter she will have had about 30 cc/kg.  No leukocytosis.  No elevation in his lactate.  Pregnancy test negative.  Remainder of laboratory workup unremarkable overall.  Some mild hypokalemia.  Chest x-ray without evidence of focal consolidation awaiting CT abdomen pelvis [MP]  1703 I, Sharper Pamella DO, am transitioning care of this patient to the oncoming provider pending CT abdomen pelvis, UA reevaluation and disposition [MP]    Clinical Course User Index [MP] Pamella Sharper LABOR, DO                                 Medical Decision Making 48 year old female with history as above presents to the ED given concern for acute infection.  Currently undergoing chemotherapy.  Status post colon resection last month.  Tachycardic and febrile meeting SIRS criteria.  Code sepsis activated.  Will initiate fluid resuscitation, laboratory workup and give vancomycin  and Zosyn  for broad-spectrum antibiotic coverage.  Will obtain chest x-ray urinalysis draw blood cultures.  Will also obtain CT abdomen pelvis given concern for acute intra-abdominal infection considering her history of colon cancer with recent resection.  Will continue to monitor hemodynamic status closely.  Amount and/or Complexity of Data Reviewed Radiology: ordered.  Risk Prescription drug management.        Final diagnoses:  Fever, unspecified fever cause  Nausea and vomiting, unspecified vomiting type    ED Discharge Orders     None          Pamella Sharper LABOR, DO 03/06/24 1704

## 2024-03-06 NOTE — Progress Notes (Signed)
 Patients husband called in stating his wife is 6 weeks out from surgery and she has nausea, vomiting and constipation. She hasn't had a good bowel movement in 5 days. He wanted to know if it was ok for her to do an enema to help relieve the constipation. I spoke with Rayfield Rummer RN she stated it was ok to do a fleets enema to help but if she gets no relief to let us  know, she also stated that the patient should contact the surgeon to make them aware of the symptoms as well. I let the husband know all the information and he had no further question and stated he would contact the surgeon as well.

## 2024-03-06 NOTE — ED Provider Triage Note (Signed)
 Emergency Medicine Provider Triage Evaluation Note  Sherry Abbott , a 48 y.o. female  was evaluated in triage.  Pt complains of fever and vomiting.  Patient is on active chemotherapy for colon cancer.  She has had previous colectomy.  She had a runny nose yesterday, today felt worse.  Fever 102 F on arrival.  She denies focal cough, dysuria.  She has had abdominal pain since previous surgery.  Review of Systems  Positive: Fever, vomiting Negative: Dysuria  Physical Exam  BP 126/78   Pulse (!) 131   Temp (!) 102 F (38.9 C) (Oral)   Resp 16   SpO2 98%  Gen:   Awake, no distress   Resp:  Normal effort  MSK:   Moves extremities without difficulty  Other:  Tachycardia noted, abdomen is generally tender  Medical Decision Making  Medically screening exam initiated at 2:20 PM.  Appropriate orders placed.  [name] was informed that the remainder of the evaluation will be completed by another provider, this initial triage assessment does not replace that evaluation, and the importance of remaining in the ED until their evaluation is complete.  Sepsis orders placed as patient is high risk, fever and tachycardia, unclear source of infection.   Desiderio Chew, PA-C 03/06/24 1422

## 2024-03-06 NOTE — ED Triage Notes (Signed)
 Patient is a chemo patient that has been having nausea, vomiting, chills and fever today. Her last chemo was 02/27/24. Take home pump was removed this past Tuesday. She denies sick contacts, diarrhea and shortness of breath.  6/12 she had a partial colectomy and now complains of constipation. After 5 days, she was able to have a bowel movement post enema.

## 2024-03-06 NOTE — Progress Notes (Signed)
 ED Pharmacy Antibiotic Sign Off An antibiotic consult was received from an ED provider for vanc/zosyn  per pharmacy dosing for sepsis. A chart review was completed to assess appropriateness.   The following one time order(s) were placed:  Vanc 1g and Zosyn  3.375g  Further antibiotic and/or antibiotic pharmacy consults should be ordered by the admitting provider if indicated.   Thank you for allowing pharmacy to be a part of this patient's care.   Britta Eva Na, [facility]  Clinical Pharmacist 03/06/24 2:41 PM

## 2024-03-06 NOTE — Progress Notes (Signed)
 Pharmacy Antibiotic Note  Sherry Abbott is a 48 y.o. female admitted on 03/06/2024 with sepsis.  Pharmacy has been consulted for Vancomycin  & Zosyn  dosing.  Plan: Zosyn  3.375g IV q8h (4 hour infusion). Vancomycin  1500mg  IV q24h to target AUC 400-550.   Estimated AUC on this regimen = 522 Monitor renal function and cx data  Check levels as needed   Height: 5' 5 (165.1 cm) Weight: 67.6 kg (149 lb) IBW/kg (Calculated) : 57  Temp (24hrs), Avg:101.1 F (38.4 C), Min:100.1 F (37.8 C), Max:102 F (38.9 C)  Recent Labs  Lab 03/06/24 1418 03/06/24 1512  WBC 4.7  --   CREATININE 0.92  --   LATICACIDVEN  --  0.9    Estimated Creatinine Clearance: 65.8 mL/min (by C-G formula based on SCr of 0.92 mg/dL).    Allergies  Allergen Reactions   Betadine [Povidone Iodine] Rash   Blueberry [Vaccinium Angustifolium] Itching   Milk-Related Compounds Anaphylaxis and Other (See Comments)    No milk or other dairy products   Neosporin [Neomycin-Bacitracin Zn-Polymyx] Other (See Comments)    Blistering rash   Povidone-Iodine Dermatitis   Shellfish Allergy Anaphylaxis   Sulfa Antibiotics Rash, Dermatitis and Other (See Comments)   Neomycin Other (See Comments)   Other Other (See Comments)    5-Fluorouracil  (5-FU) = Bad muscle spasms in the jaws and tongue    Antimicrobials this admission: 7/29 Vancomycin  >>  7/29 Zosyn  >>   Dose adjustments this admission:  Microbiology results: 7/29 BCx:  7/29 Resp PCR: negative  Thank you for allowing pharmacy to be a part of this patient's care.  Rosaline Millet PharmD 03/06/2024 9:21 PM

## 2024-03-07 ENCOUNTER — Other Ambulatory Visit: Payer: Self-pay

## 2024-03-07 DIAGNOSIS — R509 Fever, unspecified: Secondary | ICD-10-CM

## 2024-03-07 DIAGNOSIS — C189 Malignant neoplasm of colon, unspecified: Secondary | ICD-10-CM

## 2024-03-07 DIAGNOSIS — E876 Hypokalemia: Secondary | ICD-10-CM | POA: Diagnosis not present

## 2024-03-07 DIAGNOSIS — M059 Rheumatoid arthritis with rheumatoid factor, unspecified: Secondary | ICD-10-CM | POA: Diagnosis not present

## 2024-03-07 DIAGNOSIS — K5900 Constipation, unspecified: Secondary | ICD-10-CM

## 2024-03-07 DIAGNOSIS — R6510 Systemic inflammatory response syndrome (SIRS) of non-infectious origin without acute organ dysfunction: Secondary | ICD-10-CM | POA: Diagnosis not present

## 2024-03-07 DIAGNOSIS — J3089 Other allergic rhinitis: Secondary | ICD-10-CM

## 2024-03-07 LAB — CBC
HCT: 28.7 % — ABNORMAL LOW (ref 36.0–46.0)
Hemoglobin: 9.5 g/dL — ABNORMAL LOW (ref 12.0–15.0)
MCH: 28.5 pg (ref 26.0–34.0)
MCHC: 33.1 g/dL (ref 30.0–36.0)
MCV: 86.2 fL (ref 80.0–100.0)
Platelets: 199 K/uL (ref 150–400)
RBC: 3.33 MIL/uL — ABNORMAL LOW (ref 3.87–5.11)
RDW: 16.8 % — ABNORMAL HIGH (ref 11.5–15.5)
WBC: 2.7 K/uL — ABNORMAL LOW (ref 4.0–10.5)
nRBC: 0 % (ref 0.0–0.2)

## 2024-03-07 LAB — RESPIRATORY PANEL BY PCR

## 2024-03-07 LAB — BASIC METABOLIC PANEL WITH GFR
Anion gap: 7 (ref 5–15)
BUN: 10 mg/dL (ref 6–20)
CO2: 23 mmol/L (ref 22–32)
Calcium: 8 mg/dL — ABNORMAL LOW (ref 8.9–10.3)
Chloride: 106 mmol/L (ref 98–111)
Creatinine, Ser: 0.71 mg/dL (ref 0.44–1.00)
GFR, Estimated: >60 mL/min (ref >60)
Glucose, Bld: 99 mg/dL (ref 70–99)
Potassium: 3.5 mmol/L (ref 3.5–5.1)
Sodium: 136 mmol/L (ref 135–145)

## 2024-03-07 LAB — MRSA NEXT GEN BY PCR, NASAL: MRSA by PCR Next Gen: NOT DETECTED

## 2024-03-07 MED ORDER — CHLORHEXIDINE GLUCONATE CLOTH 2 % EX PADS
6.0000 | MEDICATED_PAD | Freq: Every day | CUTANEOUS | Status: DC
  Administered 2024-03-07 – 2024-03-08 (×2): 6 via TOPICAL

## 2024-03-07 MED ORDER — FOLIC ACID 1 MG PO TABS
1.0000 mg | ORAL_TABLET | Freq: Every day | ORAL | Status: DC
  Administered 2024-03-07 – 2024-03-08 (×2): 1 mg via ORAL
  Filled 2024-03-07 (×2): qty 1

## 2024-03-07 MED ORDER — BISACODYL 5 MG PO TBEC: 5.0000 mg | DELAYED_RELEASE_TABLET | Freq: Every day | ORAL | Status: DC | PRN

## 2024-03-07 MED ORDER — SODIUM CHLORIDE 0.9% FLUSH: 10.0000 mL | INTRAVENOUS | Status: DC | PRN

## 2024-03-07 MED ORDER — POLYETHYLENE GLYCOL 3350 17 G PO PACK: 17.0000 g | PACK | Freq: Every day | ORAL | Status: DC | PRN

## 2024-03-07 MED ORDER — LACTATED RINGERS IV SOLN: INTRAVENOUS | Status: AC

## 2024-03-07 MED ORDER — ADULT MULTIVITAMIN W/MINERALS CH
1.0000 | ORAL_TABLET | Freq: Every day | ORAL | Status: DC
  Administered 2024-03-07 – 2024-03-08 (×2): 1 via ORAL
  Filled 2024-03-07 (×2): qty 1

## 2024-03-07 MED ORDER — SODIUM CHLORIDE 0.9% FLUSH
10.0000 mL | Freq: Two times a day (BID) | INTRAVENOUS | Status: DC
  Administered 2024-03-07 – 2024-03-08 (×3): 10 mL

## 2024-03-07 NOTE — Plan of Care (Signed)
  Problem: Health Behavior/Discharge Planning: Goal: Ability to manage health-related needs will improve Outcome: Progressing   Problem: Clinical Measurements: Goal: Diagnostic test results will improve Outcome: Progressing   Problem: Pain Managment: Goal: General experience of comfort will improve and/or be controlled Outcome: Progressing

## 2024-03-07 NOTE — Progress Notes (Signed)
 PROGRESS NOTE    Sherry Abbott  FMW:[id number] DOB: 07-25-1976 DOA: 03/06/2024 PCP: Sun, Vyvyan, MD    Brief Narrative:   Sherry Abbott is a 48 y.o. 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  Patient called her oncologist who advised to present to the ED for further evaluation.  Has been eating a low fiber diet since surgery, reportedly ate some peas, rice and salmon yesterday which are not new foods for her.  She has been having constipation over the last 5 days, took a Fleet enema and reports 2 good bowel movements that made her feel improved.  Does endorse some mild nasal discharge, frontal headache with concern for possible sinusitis.  Denies chest pain, no shortness of breath, no abdominal pain, no diarrhea, no dizziness, no cough, no urinary symptoms.  In the ED, temperature 102.0 F, HR 131, RR 16, BP 126/78, SpO2 98% on room air.  WBC 4.7, hemoglobin 11.1, platelet count 251.  Sodium 137, potassium 3.2, chloride 101, CO2 24, glucose 102, BUN 14, creatinine 0.92.  AST 18, ALT 19, total bilirubin 0.9.  Lactic acid 0.9.  hCG negative.  COVID/RSV/influenza PCR negative.  Respiratory viral panel negative.  Urinalysis unrevealing.  Chest x-ray with no acute cardiopulmonary disease process.  CT abdomen/pelvis with contrast with no acute intraabdominal or pelvic pathology, no evidence of recurrent or metastatic disease; right hemicolectomy with ileocolic anastomosis with no bowel obstruction.  Patient received IV fluids, IV Zofran , started on IV vancomycin  and Zosyn .  TRH consulted for admission for further evaluation management of fever.  Assessment & Plan:   Fever Concern for acute sinusitis Patient  presenting with fever.  Temperature 102.0 F on arrival with elevated heart rate 131.  WBC count within normal limits.  Complicated by active chemotherapy. Workup unrevealing with normal lactic acid, COVID/RSV/influenza PCR and respiratory viral panel negative.  Urinalysis unrevealing and chest x-ray with no acute cardiopulmonary disease process.  CT abdomen/pelvis with no acute intra-abdominal/pelvic pathology.  MRSA PCR negative.  Patient complaining of frontal headache and nasal drainage, concern for possible bacterial sinusitis.  Initially started on empiric antibiotics with vancomycin  and Zosyn . -- MRSA PCR negative, will discontinue vancomycin  -- Blood cultures x 2: Pending -- Continue Zosyn  -- Check CBC, procalcitonin in a.m. -- Monitor fever curve, remains afebrile over the next 24 hours, anticipate discharge home tomorrow with likely course of Augmentin outpatient.  Hypokalemia Potassium 3.2 on admission, repleted. -- Repeat electrolytes in a.m.  Invasive adenocarcinoma of the colon Patient underwent hemicolectomy 01/19/2024, currently on chemotherapy with FOLFOX q. 14 days x 6 months.  Last chemotherapy on 02/27/2024.  Continue outpatient follow-up with medical oncology, Dr. Autumn.   Rheumatoid arthritis On methotrexate weekly outpatient.  Currently on hold.  Asthma, stable --DuoNebs as needed  Allergic rhinitis -- Singulair  10 mg p.o. daily -- Claritin  10 mg p.o. daily  Constipation -- Continue Colace twice daily -- MiraLAX  as needed   DVT prophylaxis: enoxaparin  (LOVENOX ) injection 40 mg Start: 03/06/24 2200    Code Status: Full Code Family Communication: Updated spouse present at bedside this morning  Disposition Plan:  Level of care: Telemetry Status is: Observation The patient remains OBS appropriate and will d/c before 2 midnights.    Consultants:  None  Procedures:  None  Antimicrobials:  Vancomycin  Zosyn    Subjective: Patient seen examined bedside,  lying in bed.  Spouse present at bedside.  Currently afebrile.  No specific complaints this morning.  Headache from frontal sinus resolved.  MRSA PCR this morning negative, will discontinue vancomycin .  Continues on Zosyn .  Discussed that remains afebrile next 24 hours anticipate discharge home tomorrow with likely continue oral antibiotics with Augmentin.  No other questions or concerns at this time.  Denies headache, no visual changes, no chest pain, no palpitations, no cough/congestion, no current fever, no chills/night sweats, no nausea/vomiting, no abdominal pain, no focal weakness, no fatigue, no paresthesias.  No acute events overnight per nurse staff.  Objective: Vitals:   03/06/24 2158 03/07/24 0058 03/07/24 0545 03/07/24 1018  BP: 105/63 113/61 110/69 101/68  Pulse: 83 82 79 78  Resp: 16 16 17 15   Temp: 98.3 F (36.8 C) 98.4 F (36.9 C) 98.4 F (36.9 C) 98.9 F (37.2 C)  TempSrc: Oral Oral Oral   SpO2: 100% 100% 100% 100%  Weight:      Height:        Intake/Output Summary (Last 24 hours) at 03/07/2024 1309 Last data filed at 03/07/2024 1037 Gross per 24 hour  Intake 1146.44 ml  Output --  Net 1146.44 ml   Filed Weights   03/06/24 1538 03/06/24 1549  Weight: 67.6 kg 67.6 kg    Examination:  Physical Exam: GEN: NAD, alert and oriented x 3, wd/wn HEENT: NCAT, PERRL, EOMI, sclera clear, MMM PULM: CTAB w/o wheezes/crackles, normal respiratory effort, on room air CV: RRR w/o M/G/R GI: abd soft, NTND, + BS MSK: no peripheral edema, moves all extremities independently NEURO: CN II-XII intact, no focal deficits, sensation to light touch intact PSYCH: normal mood/affect Integumentary: dry/intact, no rashes or wounds    Data Reviewed: I have personally reviewed following labs and imaging studies  CBC: Recent Labs  Lab 03/06/24 1418 03/07/24 0615  WBC 4.7 2.7*  NEUTROABS 4.1  --   HGB 11.1* 9.5*  HCT 34.0* 28.7*  MCV 85.4 86.2  PLT 251 199   Basic Metabolic  Panel: Recent Labs  Lab 03/06/24 1418 03/07/24 0615  NA 137 136  K 3.2* 3.5  CL 101 106  CO2 24 23  GLUCOSE 102* 99  BUN 14 10  CREATININE 0.92 0.71  CALCIUM  9.2 8.0*   GFR: Estimated Creatinine Clearance: 75.7 mL/min (by C-G formula based on SCr of 0.71 mg/dL). Liver Function Tests: Recent Labs  Lab 03/06/24 1418  AST 18  ALT 19  ALKPHOS 42  BILITOT 0.9  PROT 7.7  ALBUMIN  3.8   No results for input(s): LIPASE, AMYLASE in the last 168 hours. No results for input(s): AMMONIA in the last 168 hours. Coagulation Profile: Recent Labs  Lab 03/06/24 1418  INR 0.9   Cardiac Enzymes: No results for input(s): CKTOTAL, CKMB, CKMBINDEX, TROPONINI in the last 168 hours. BNP (last 3 results) No results for input(s): PROBNP in the last 8760 hours. HbA1C: No results for input(s): HGBA1C in the last 72 hours. CBG: No results for input(s): GLUCAP in the last 168 hours. Lipid Profile: No results for input(s): CHOL, HDL, LDLCALC, TRIG, CHOLHDL, LDLDIRECT in the last 72 hours. Thyroid Function Tests: No results for input(s): TSH, T4TOTAL, FREET4, T3FREE, THYROIDAB in the last 72 hours. Anemia Panel: No results for input(s): VITAMINB12, FOLATE, FERRITIN, TIBC, IRON , RETICCTPCT in the last 72 hours. Sepsis Labs: Recent Labs  Lab 03/06/24 1512  LATICACIDVEN 0.9    Recent Results (from  the past 240 hours)  Resp panel by RT-PCR (RSV, Flu A&B, Covid) Anterior Nasal Swab     Status: None   Collection Time: 03/06/24  6:43 PM   Specimen: Anterior Nasal Swab  Result Value Ref Range Status   SARS Coronavirus 2 by RT PCR NEGATIVE NEGATIVE Final    Comment: (NOTE) SARS-CoV-2 target nucleic acids are NOT DETECTED.  The SARS-CoV-2 RNA is generally detectable in upper respiratory specimens during the acute phase of infection. The lowest concentration of SARS-CoV-2 viral copies this assay can detect is 138 copies/mL. A negative  result does not preclude SARS-Cov-2 infection and should not be used as the sole basis for treatment or other patient management decisions. A negative result may occur with  improper specimen collection/handling, submission of specimen other than nasopharyngeal swab, presence of viral mutation(s) within the areas targeted by this assay, and inadequate number of viral copies(<138 copies/mL). A negative result must be combined with clinical observations, patient history, and epidemiological information. The expected result is Negative.  Fact Sheet for Patients:  BloggerCourse.com  Fact Sheet for Healthcare Providers:  SeriousBroker.it  This test is no t yet approved or cleared by the [place]  FDA and  has been authorized for detection and/or diagnosis of SARS-CoV-2 by FDA under an Emergency Use Authorization (EUA). This EUA will remain  in effect (meaning this test can be used) for the duration of the COVID-19 declaration under Section 564(b)(1) of the Act, 21 U.S.C.section 360bbb-3(b)(1), unless the authorization is terminated  or revoked sooner.       Influenza A by PCR NEGATIVE NEGATIVE Final   Influenza B by PCR NEGATIVE NEGATIVE Final    Comment: (NOTE) The Xpert Xpress SARS-CoV-2/FLU/RSV plus assay is intended as an aid in the diagnosis of influenza from Nasopharyngeal swab specimens and should not be used as a sole basis for treatment. Nasal washings and aspirates are unacceptable for Xpert Xpress SARS-CoV-2/FLU/RSV testing.  Fact Sheet for Patients: BloggerCourse.com  Fact Sheet for Healthcare Providers: SeriousBroker.it  This test is not yet approved or cleared by the [place]  FDA and has been authorized for detection and/or diagnosis of SARS-CoV-2 by FDA under an Emergency Use Authorization (EUA). This EUA will remain in effect (meaning this test can be used)  for the duration of the COVID-19 declaration under Section 564(b)(1) of the Act, 21 U.S.C. section 360bbb-3(b)(1), unless the authorization is terminated or revoked.     Resp Syncytial Virus by PCR NEGATIVE NEGATIVE Final    Comment: (NOTE) Fact Sheet for Patients: BloggerCourse.com  Fact Sheet for Healthcare Providers: SeriousBroker.it  This test is not yet approved or cleared by the [place]  FDA and has been authorized for detection and/or diagnosis of SARS-CoV-2 by FDA under an Emergency Use Authorization (EUA). This EUA will remain in effect (meaning this test can be used) for the duration of the COVID-19 declaration under Section 564(b)(1) of the Act, 21 U.S.C. section 360bbb-3(b)(1), unless the authorization is terminated or revoked.  Performed at [facility], [address], [place], [place] [address]   Respiratory (~20 pathogens) panel by PCR     Status: None   Collection Time: 03/06/24  8:03 PM   Specimen: Nasopharyngeal Swab; Respiratory  Result Value Ref Range Status   Adenovirus NOT DETECTED NOT DETECTED Final   Coronavirus 229E NOT DETECTED NOT DETECTED Final    Comment: (NOTE) The Coronavirus on the Respiratory Panel, DOES NOT test for the novel  Coronavirus (2019 nCoV)    Coronavirus  HKU1 NOT DETECTED NOT DETECTED Final   Coronavirus NL63 NOT DETECTED NOT DETECTED Final   Coronavirus OC43 NOT DETECTED NOT DETECTED Final   Metapneumovirus NOT DETECTED NOT DETECTED Final   Rhinovirus / Enterovirus NOT DETECTED NOT DETECTED Final   Influenza A NOT DETECTED NOT DETECTED Final   Influenza B NOT DETECTED NOT DETECTED Final   Parainfluenza Virus 1 NOT DETECTED NOT DETECTED Final   Parainfluenza Virus 2 NOT DETECTED NOT DETECTED Final   Parainfluenza Virus 3 NOT DETECTED NOT DETECTED Final   Parainfluenza Virus 4 NOT DETECTED NOT DETECTED Final   Respiratory Syncytial Virus NOT DETECTED NOT  DETECTED Final   Bordetella pertussis NOT DETECTED NOT DETECTED Final   Bordetella Parapertussis NOT DETECTED NOT DETECTED Final   Chlamydophila pneumoniae NOT DETECTED NOT DETECTED Final   Mycoplasma pneumoniae NOT DETECTED NOT DETECTED Final    Comment: Performed at [facility], [address] [place], [place] [address]  Blood Culture (routine x 2)     Status: None (Preliminary result)   Collection Time: 03/07/24  6:15 AM   Specimen: BLOOD LEFT ARM  Result Value Ref Range Status   Specimen Description   Final    BLOOD LEFT ARM Performed at [facility], [address] [place], [place] [address]    Special Requests   Final    BOTTLES DRAWN AEROBIC AND ANAEROBIC Blood Culture adequate volume Performed at [facility], [address], [place], [place] [address]    Culture   Final    NO GROWTH < 12 HOURS Performed at [facility], [address] [place], [place] [address]    Report Status PENDING  Incomplete  MRSA Next Gen by PCR, Nasal     Status: None   Collection Time: 03/07/24  6:52 AM   Specimen: Nasal Mucosa; Nasal Swab  Result Value Ref Range Status   MRSA by PCR Next Gen NOT DETECTED NOT DETECTED Final    Comment: (NOTE) The GeneXpert MRSA Assay (FDA approved for NASAL specimens only), is one component of a comprehensive MRSA colonization surveillance program. It is not intended to diagnose MRSA infection nor to guide or monitor treatment for MRSA infections. Test performance is not FDA approved in patients less than 57 years old. Performed at [facility], [address], [place], [place] [address]          Radiology Studies: CT ABDOMEN PELVIS W CONTRAST Result Date: 03/06/2024 CLINICAL DATA:  History of colorectal cancer status post resection. EXAM: CT ABDOMEN AND PELVIS WITH CONTRAST TECHNIQUE: Multidetector CT imaging of the abdomen and pelvis was performed using the standard protocol following bolus  administration of intravenous contrast. RADIATION DOSE REDUCTION: This exam was performed according to the departmental dose-optimization program which includes automated exposure control, adjustment of the mA and/or kV according to patient size and/or use of iterative reconstruction technique. CONTRAST:  OMNIPAQUE  IOHEXOL  300 MG/ML  SOLN COMPARISON:  CT abdomen pelvis dated 01/16/2024. FINDINGS: Lower chest: The visualized lung bases are clear. No intra-abdominal free air or free fluid. Hepatobiliary: The liver is unremarkable. The gallbladder is unremarkable. No biliary dilatation. Pancreas: Unremarkable. No pancreatic ductal dilatation or surrounding inflammatory changes. Spleen: Normal in size without focal abnormality. Adrenals/Urinary Tract: The adrenal glands unremarkable the kidneys, visualized ureters, and urinary bladder appear unremarkable. Stomach/Bowel: Right hemicolectomy with ileocolic anastomosis. There is no bowel obstruction or active inflammation. Vascular/Lymphatic: The abdominal aorta and IVC unremarkable. No portal venous gas.  There is no adenopathy. Reproductive: The uterus is anteverted. No suspicious adnexal masses. Other: None Musculoskeletal: No acute or significant osseous findings. IMPRESSION: 1. No acute intra-abdominal or pelvic pathology. No evidence of recurrent or metastatic disease. 2. Right hemicolectomy with ileocolic anastomosis. No bowel obstruction. The Electronically Signed   By: Vanetta Chou M.D.   On: 03/06/2024 17:19   DG Chest 2 View Result Date: 03/06/2024 CLINICAL DATA:  Questionable sepsis EXAM: CHEST - 2 VIEW COMPARISON:  None Available. FINDINGS: Port in the anterior chest wall with tip in distal SVC. Normal mediastinum and cardiac silhouette. Normal pulmonary vasculature. No evidence of effusion, infiltrate, or pneumothorax. No acute bony abnormality. IMPRESSION: No acute cardiopulmonary process. Electronically Signed   By: Jackquline Boxer M.D.   On:  03/06/2024 15:35        Scheduled Meds:  B-complex with vitamin C   1 tablet Oral Q supper   Chlorhexidine  Gluconate Cloth  6 each Topical Daily   docusate sodium   200 mg Oral BID   enoxaparin  (LOVENOX ) injection  40 mg Subcutaneous Q24H   folic acid   1 mg Oral QHS   loratadine   10 mg Oral QHS   montelukast   10 mg Oral QHS   multivitamin with minerals  1 tablet Oral QHS   pantoprazole   20 mg Oral BID   polyethylene glycol  17 g Oral Daily   sodium chloride  flush  10-40 mL Intracatheter Q12H   Continuous Infusions:  acetaminophen  Stopped (03/06/24 1833)   piperacillin -tazobactam (ZOSYN )  IV 3.375 g (03/07/24 0602)     LOS: 0 days    Time spent: 52 minutes spent on 03/07/2024 caring for this patient face-to-face including chart review, ordering labs/tests, documenting, discussion with nursing staff, consultants, updating family and interview/physical exam    Camellia PARAS [place], DO [facility] Available via Epic secure chat 7am-7pm After these hours, please refer to coverage provider listed on [email or web address] 03/07/2024, 1:09 PM

## 2024-03-07 NOTE — Progress Notes (Signed)
   03/07/24 0922  TOC Brief Assessment  Insurance and Status Reviewed  Patient has primary care physician Yes  Home environment has been reviewed single family home  Prior level of function: independent  Prior/Current Home Services No current home services  Social Drivers of Health Review SDOH reviewed no interventions necessary  Readmission risk has been reviewed Yes  Transition of care needs no transition of care needs at this time    Heather Saltness, MSW, LCSW Clinical Social Worker Inpatient Care Management 03/07/2024 9:23 AM

## 2024-03-07 NOTE — Plan of Care (Signed)

## 2024-03-08 ENCOUNTER — Telehealth: Payer: Self-pay

## 2024-03-08 DIAGNOSIS — M059 Rheumatoid arthritis with rheumatoid factor, unspecified: Secondary | ICD-10-CM | POA: Diagnosis not present

## 2024-03-08 DIAGNOSIS — E876 Hypokalemia: Secondary | ICD-10-CM | POA: Diagnosis not present

## 2024-03-08 DIAGNOSIS — R509 Fever, unspecified: Secondary | ICD-10-CM | POA: Diagnosis not present

## 2024-03-08 DIAGNOSIS — C189 Malignant neoplasm of colon, unspecified: Secondary | ICD-10-CM | POA: Diagnosis not present

## 2024-03-08 DIAGNOSIS — R6510 Systemic inflammatory response syndrome (SIRS) of non-infectious origin without acute organ dysfunction: Secondary | ICD-10-CM | POA: Diagnosis not present

## 2024-03-08 LAB — CBC
HCT: 28.9 % — ABNORMAL LOW (ref 36.0–46.0)
Hemoglobin: 9.4 g/dL — ABNORMAL LOW (ref 12.0–15.0)
MCH: 28.1 pg (ref 26.0–34.0)
MCHC: 32.5 g/dL (ref 30.0–36.0)
MCV: 86.5 fL (ref 80.0–100.0)
Platelets: 192 K/uL (ref 150–400)
RBC: 3.34 MIL/uL — ABNORMAL LOW (ref 3.87–5.11)
RDW: 16.7 % — ABNORMAL HIGH (ref 11.5–15.5)
WBC: 2.4 K/uL — ABNORMAL LOW (ref 4.0–10.5)
nRBC: 0 % (ref 0.0–0.2)

## 2024-03-08 LAB — BASIC METABOLIC PANEL WITH GFR
Anion gap: 7 (ref 5–15)
BUN: 8 mg/dL (ref 6–20)
CO2: 24 mmol/L (ref 22–32)
Calcium: 8.2 mg/dL — ABNORMAL LOW (ref 8.9–10.3)
Chloride: 106 mmol/L (ref 98–111)
Creatinine, Ser: 0.95 mg/dL (ref 0.44–1.00)
GFR, Estimated: >60 mL/min (ref >60)
Glucose, Bld: 98 mg/dL (ref 70–99)
Potassium: 3.2 mmol/L — ABNORMAL LOW (ref 3.5–5.1)
Sodium: 137 mmol/L (ref 135–145)

## 2024-03-08 LAB — PROCALCITONIN: Procalcitonin: <0.1 ng/mL

## 2024-03-08 LAB — MAGNESIUM: Magnesium: 2 mg/dL (ref 1.7–2.4)

## 2024-03-08 MED ORDER — LACTATED RINGERS IV SOLN: INTRAVENOUS | Status: AC

## 2024-03-08 MED ORDER — POTASSIUM CHLORIDE CRYS ER 20 MEQ PO TBCR
40.0000 meq | EXTENDED_RELEASE_TABLET | ORAL | Status: AC
  Administered 2024-03-08 (×2): 40 meq via ORAL
  Filled 2024-03-08 (×2): qty 2

## 2024-03-08 NOTE — Progress Notes (Signed)
 PROGRESS NOTE    Libni Fusaro  FMW:[id number] DOB: 1975-08-16 DOA: 03/06/2024 PCP: Sun, Vyvyan, MD    Brief Narrative:   Sherry Abbott is a 48 y.o. female with past medical history significant for RA on methotrexate, hidradenitis suppurativa, asthma, allergic rhinitis, history of migraine headache, invasive adenocarcinoma of the colon s/p hemicolectomy 01/19/2024 on chemotherapy who presented to [facility] ED on 03/06/2024 with complaints of nausea, vomiting, chills and fever.  Patient called her oncologist who advised to present to the ED for further evaluation.  Has been eating a low fiber diet since surgery, reportedly ate some peas, rice and salmon yesterday which are not new foods for her.  She has been having constipation over the last 5 days, took a Fleet enema and reports 2 good bowel movements that made her feel improved.  Does endorse some mild nasal discharge, frontal headache with concern for possible sinusitis.  Denies chest pain, no shortness of breath, no abdominal pain, no diarrhea, no dizziness, no cough, no urinary symptoms.  In the ED, temperature 102.0 F, HR 131, RR 16, BP 126/78, SpO2 98% on room air.  WBC 4.7, hemoglobin 11.1, platelet count 251.  Sodium 137, potassium 3.2, chloride 101, CO2 24, glucose 102, BUN 14, creatinine 0.92.  AST 18, ALT 19, total bilirubin 0.9.  Lactic acid 0.9.  hCG negative.  COVID/RSV/influenza PCR negative.  Respiratory viral panel negative.  Urinalysis unrevealing.  Chest x-ray with no acute cardiopulmonary disease process.  CT abdomen/pelvis with contrast with no acute intraabdominal or pelvic pathology, no evidence of recurrent or metastatic disease; right hemicolectomy with ileocolic anastomosis with no bowel obstruction.  Patient received IV fluids, IV Zofran , started on IV vancomycin  and Zosyn .  TRH consulted for admission for further evaluation management of fever.  Assessment & Plan:   Fever Concern for acute sinusitis Patient  presenting with fever.  Temperature 102.0 F on arrival with elevated heart rate 131.  WBC count within normal limits.  Complicated by active chemotherapy. Workup unrevealing with normal lactic acid, COVID/RSV/influenza PCR and respiratory viral panel negative.  Urinalysis unrevealing and chest x-ray with no acute cardiopulmonary disease process.  CT abdomen/pelvis with no acute intra-abdominal/pelvic pathology.  MRSA PCR negative.  Patient complaining of frontal headache and nasal drainage, concern for possible bacterial sinusitis.  Initially started on empiric antibiotics with vancomycin  and Zosyn . -- MRSA PCR negative, discontinued vancomycin  7/30 -- Continues with intermittent fever, Tmax 101.3 past 24 hours -- Procalcitonin <0.10 -- Blood cultures x 2: no growth <24h -- Continue Zosyn  -- Check CBC, ESR/CRP in am -- Monitor fever curve, remains afebrile over the next 24 hours, anticipate discharge home tomorrow with likely course of Augmentin outpatient.  Hypokalemia Potassium 3.2 today, will replete -- Repeat electrolytes in a.m.  Invasive adenocarcinoma of the colon Patient underwent hemicolectomy 01/19/2024, currently on chemotherapy with FOLFOX q. 14 days x 6 months.  Last chemotherapy on 02/27/2024.  Continue outpatient follow-up with medical oncology, Dr. Autumn.   Rheumatoid arthritis On methotrexate weekly outpatient.  Currently on hold.  Asthma, stable -- DuoNebs as needed  Allergic rhinitis -- Singulair  10 mg p.o. daily -- Claritin  10 mg p.o. daily  Constipation -- Continue Colace twice daily -- MiraLAX  as needed   DVT prophylaxis: enoxaparin  (LOVENOX ) injection 40 mg Start: 03/06/24 2200    Code Status: Full Code Family Communication: Updated spouse present at bedside this morning  Disposition Plan:  Level of care: Telemetry Status is: Observation The patient remains OBS appropriate and will  d/c before 2 midnights.    Consultants:  None  Procedures:   None  Antimicrobials:  Vancomycin  Zosyn    Subjective: Patient seen examined bedside, lying in bed.  Spouse present at bedside.  Tmax one 1.3 past 24 hours.  Blood cultures with no growth less than 12 hours.  Remains on IV Zosyn .  No other questions or concerns at this time.  Denies headache, no visual changes, no chest pain, no palpitations, no cough/congestion, no current fever, no chills/night sweats, no nausea/vomiting, no abdominal pain, no focal weakness, no fatigue, no paresthesias.  No acute events overnight per nursing staff.  Objective: Vitals:   03/07/24 1018 03/07/24 1857 03/07/24 2023 03/08/24 0533  BP: 101/68 108/74 112/71 111/73  Pulse: 78 93 87 73  Resp: 15  18 18   Temp: 98.9 F (37.2 C) (!) 101.3 F (38.5 C) (!) 100.6 F (38.1 C) 98.3 F (36.8 C)  TempSrc:   Oral Oral  SpO2: 100% 100% 100% 100%  Weight:      Height:        Intake/Output Summary (Last 24 hours) at 03/08/2024 1143 Last data filed at 03/08/2024 1026 Gross per 24 hour  Intake 1121.59 ml  Output --  Net 1121.59 ml   Filed Weights   03/06/24 1538 03/06/24 1549  Weight: 67.6 kg 67.6 kg    Examination:  Physical Exam: GEN: NAD, alert and oriented x 3, wd/wn HEENT: NCAT, PERRL, EOMI, sclera clear, MMM PULM: CTAB w/o wheezes/crackles, normal respiratory effort, on room air CV: RRR w/o M/G/R GI: abd soft, NTND, + BS MSK: no peripheral edema, moves all extremities independently NEURO: CN II-XII intact, no focal deficits, sensation to light touch intact PSYCH: normal mood/affect Integumentary: dry/intact, no rashes or wounds    Data Reviewed: I have personally reviewed following labs and imaging studies  CBC: Recent Labs  Lab 03/06/24 1418 03/07/24 0615 03/08/24 0640  WBC 4.7 2.7* 2.4*  NEUTROABS 4.1  --   --   HGB 11.1* 9.5* 9.4*  HCT 34.0* 28.7* 28.9*  MCV 85.4 86.2 86.5  PLT 251 199 192   Basic Metabolic Panel: Recent Labs  Lab 03/06/24 1418 03/07/24 0615 03/08/24 0640   NA 137 136 137  K 3.2* 3.5 3.2*  CL 101 106 106  CO2 24 23 24   GLUCOSE 102* 99 98  BUN 14 10 8   CREATININE 0.92 0.71 0.95  CALCIUM  9.2 8.0* 8.2*  MG  --   --  2.0   GFR: Estimated Creatinine Clearance: 63.8 mL/min (by C-G formula based on SCr of 0.95 mg/dL). Liver Function Tests: Recent Labs  Lab 03/06/24 1418  AST 18  ALT 19  ALKPHOS 42  BILITOT 0.9  PROT 7.7  ALBUMIN  3.8   No results for input(s): LIPASE, AMYLASE in the last 168 hours. No results for input(s): AMMONIA in the last 168 hours. Coagulation Profile: Recent Labs  Lab 03/06/24 1418  INR 0.9   Cardiac Enzymes: No results for input(s): CKTOTAL, CKMB, CKMBINDEX, TROPONINI in the last 168 hours. BNP (last 3 results) No results for input(s): PROBNP in the last 8760 hours. HbA1C: No results for input(s): HGBA1C in the last 72 hours. CBG: No results for input(s): GLUCAP in the last 168 hours. Lipid Profile: No results for input(s): CHOL, HDL, LDLCALC, TRIG, CHOLHDL, LDLDIRECT in the last 72 hours. Thyroid Function Tests: No results for input(s): TSH, T4TOTAL, FREET4, T3FREE, THYROIDAB in the last 72 hours. Anemia Panel: No results for input(s): VITAMINB12, FOLATE, FERRITIN, TIBC, IRON ,  RETICCTPCT in the last 72 hours. Sepsis Labs: Recent Labs  Lab 03/06/24 1512 03/08/24 0640  PROCALCITON  --  <0.10  LATICACIDVEN 0.9  --     Recent Results (from the past 240 hours)  Resp panel by RT-PCR (RSV, Flu A&B, Covid) Anterior Nasal Swab     Status: None   Collection Time: 03/06/24  6:43 PM   Specimen: Anterior Nasal Swab  Result Value Ref Range Status   SARS Coronavirus 2 by RT PCR NEGATIVE NEGATIVE Final    Comment: (NOTE) SARS-CoV-2 target nucleic acids are NOT DETECTED.  The SARS-CoV-2 RNA is generally detectable in upper respiratory specimens during the acute phase of infection. The lowest concentration of SARS-CoV-2 viral copies this assay can  detect is 138 copies/mL. A negative result does not preclude SARS-Cov-2 infection and should not be used as the sole basis for treatment or other patient management decisions. A negative result may occur with  improper specimen collection/handling, submission of specimen other than nasopharyngeal swab, presence of viral mutation(s) within the areas targeted by this assay, and inadequate number of viral copies(<138 copies/mL). A negative result must be combined with clinical observations, patient history, and epidemiological information. The expected result is Negative.  Fact Sheet for Patients:  BloggerCourse.com  Fact Sheet for Healthcare Providers:  SeriousBroker.it  This test is no t yet approved or cleared by the [place]  FDA and  has been authorized for detection and/or diagnosis of SARS-CoV-2 by FDA under an Emergency Use Authorization (EUA). This EUA will remain  in effect (meaning this test can be used) for the duration of the COVID-19 declaration under Section 564(b)(1) of the Act, 21 U.S.C.section 360bbb-3(b)(1), unless the authorization is terminated  or revoked sooner.       Influenza A by PCR NEGATIVE NEGATIVE Final   Influenza B by PCR NEGATIVE NEGATIVE Final    Comment: (NOTE) The Xpert Xpress SARS-CoV-2/FLU/RSV plus assay is intended as an aid in the diagnosis of influenza from Nasopharyngeal swab specimens and should not be used as a sole basis for treatment. Nasal washings and aspirates are unacceptable for Xpert Xpress SARS-CoV-2/FLU/RSV testing.  Fact Sheet for Patients: BloggerCourse.com  Fact Sheet for Healthcare Providers: SeriousBroker.it  This test is not yet approved or cleared by the [place]  FDA and has been authorized for detection and/or diagnosis of SARS-CoV-2 by FDA under an Emergency Use Authorization (EUA). This EUA will remain in  effect (meaning this test can be used) for the duration of the COVID-19 declaration under Section 564(b)(1) of the Act, 21 U.S.C. section 360bbb-3(b)(1), unless the authorization is terminated or revoked.     Resp Syncytial Virus by PCR NEGATIVE NEGATIVE Final    Comment: (NOTE) Fact Sheet for Patients: BloggerCourse.com  Fact Sheet for Healthcare Providers: SeriousBroker.it  This test is not yet approved or cleared by the [place]  FDA and has been authorized for detection and/or diagnosis of SARS-CoV-2 by FDA under an Emergency Use Authorization (EUA). This EUA will remain in effect (meaning this test can be used) for the duration of the COVID-19 declaration under Section 564(b)(1) of the Act, 21 U.S.C. section 360bbb-3(b)(1), unless the authorization is terminated or revoked.  Performed at [facility], [address] [place], [place] [address]   Respiratory (~20 pathogens) panel by PCR     Status: None   Collection Time: 03/06/24  8:03 PM   Specimen: Nasopharyngeal Swab; Respiratory  Result Value Ref Range Status   Adenovirus NOT DETECTED NOT DETECTED Final  Coronavirus 229E NOT DETECTED NOT DETECTED Final    Comment: (NOTE) The Coronavirus on the Respiratory Panel, DOES NOT test for the novel  Coronavirus (2019 nCoV)    Coronavirus HKU1 NOT DETECTED NOT DETECTED Final   Coronavirus NL63 NOT DETECTED NOT DETECTED Final   Coronavirus OC43 NOT DETECTED NOT DETECTED Final   Metapneumovirus NOT DETECTED NOT DETECTED Final   Rhinovirus / Enterovirus NOT DETECTED NOT DETECTED Final   Influenza A NOT DETECTED NOT DETECTED Final   Influenza B NOT DETECTED NOT DETECTED Final   Parainfluenza Virus 1 NOT DETECTED NOT DETECTED Final   Parainfluenza Virus 2 NOT DETECTED NOT DETECTED Final   Parainfluenza Virus 3 NOT DETECTED NOT DETECTED Final   Parainfluenza Virus 4 NOT DETECTED NOT DETECTED Final    Respiratory Syncytial Virus NOT DETECTED NOT DETECTED Final   Bordetella pertussis NOT DETECTED NOT DETECTED Final   Bordetella Parapertussis NOT DETECTED NOT DETECTED Final   Chlamydophila pneumoniae NOT DETECTED NOT DETECTED Final   Mycoplasma pneumoniae NOT DETECTED NOT DETECTED Final    Comment: Performed at [facility], [address], [place], [place] [address]  Blood Culture (routine x 2)     Status: None (Preliminary result)   Collection Time: 03/07/24  6:15 AM   Specimen: BLOOD LEFT ARM  Result Value Ref Range Status   Specimen Description   Final    BLOOD LEFT ARM Performed at [facility], [address] [place], [place] [address]    Special Requests   Final    BOTTLES DRAWN AEROBIC AND ANAEROBIC Blood Culture adequate volume Performed at [facility], [address] [place], [place] [address]    Culture   Final    NO GROWTH < 12 HOURS Performed at [facility], [address], [place], [place] [address]    Report Status PENDING  Incomplete  MRSA Next Gen by PCR, Nasal     Status: None   Collection Time: 03/07/24  6:52 AM   Specimen: Nasal Mucosa; Nasal Swab  Result Value Ref Range Status   MRSA by PCR Next Gen NOT DETECTED NOT DETECTED Final    Comment: (NOTE) The GeneXpert MRSA Assay (FDA approved for NASAL specimens only), is one component of a comprehensive MRSA colonization surveillance program. It is not intended to diagnose MRSA infection nor to guide or monitor treatment for MRSA infections. Test performance is not FDA approved in patients less than 62 years old. Performed at [facility], [address], [place], [place] [address]          Radiology Studies: CT ABDOMEN PELVIS W CONTRAST Result Date: 03/06/2024 CLINICAL DATA:  History of colorectal cancer status post resection. EXAM: CT ABDOMEN AND PELVIS WITH CONTRAST TECHNIQUE: Multidetector CT imaging of the abdomen and pelvis was performed  using the standard protocol following bolus administration of intravenous contrast. RADIATION DOSE REDUCTION: This exam was performed according to the departmental dose-optimization program which includes automated exposure control, adjustment of the mA and/or kV according to patient size and/or use of iterative reconstruction technique. CONTRAST:  OMNIPAQUE  IOHEXOL  300 MG/ML  SOLN COMPARISON:  CT abdomen pelvis dated 01/16/2024. FINDINGS: Lower chest: The visualized lung bases are clear. No intra-abdominal free air or free fluid. Hepatobiliary: The liver is unremarkable. The gallbladder is unremarkable. No biliary dilatation. Pancreas: Unremarkable. No pancreatic ductal dilatation or surrounding inflammatory changes. Spleen: Normal in size without focal abnormality. Adrenals/Urinary Tract: The adrenal glands unremarkable the kidneys,  visualized ureters, and urinary bladder appear unremarkable. Stomach/Bowel: Right hemicolectomy with ileocolic anastomosis. There is no bowel obstruction or active inflammation. Vascular/Lymphatic: The abdominal aorta and IVC unremarkable. No portal venous gas. There is no adenopathy. Reproductive: The uterus is anteverted. No suspicious adnexal masses. Other: None Musculoskeletal: No acute or significant osseous findings. IMPRESSION: 1. No acute intra-abdominal or pelvic pathology. No evidence of recurrent or metastatic disease. 2. Right hemicolectomy with ileocolic anastomosis. No bowel obstruction. The Electronically Signed   By: Vanetta Chou M.D.   On: 03/06/2024 17:19   DG Chest 2 View Result Date: 03/06/2024 CLINICAL DATA:  Questionable sepsis EXAM: CHEST - 2 VIEW COMPARISON:  None Available. FINDINGS: Port in the anterior chest wall with tip in distal SVC. Normal mediastinum and cardiac silhouette. Normal pulmonary vasculature. No evidence of effusion, infiltrate, or pneumothorax. No acute bony abnormality. IMPRESSION: No acute cardiopulmonary process.  Electronically Signed   By: Jackquline Boxer M.D.   On: 03/06/2024 15:35        Scheduled Meds:  B-complex with vitamin C   1 tablet Oral Q supper   Chlorhexidine  Gluconate Cloth  6 each Topical Daily   docusate sodium   200 mg Oral BID   enoxaparin  (LOVENOX ) injection  40 mg Subcutaneous Q24H   folic acid   1 mg Oral QHS   loratadine   10 mg Oral QHS   montelukast   10 mg Oral QHS   multivitamin with minerals  1 tablet Oral QHS   pantoprazole   20 mg Oral BID   sodium chloride  flush  10-40 mL Intracatheter Q12H   Continuous Infusions:  lactated ringers  75 mL/hr at 03/08/24 1026   piperacillin -tazobactam (ZOSYN )  IV Stopped (03/08/24 0956)     LOS: 0 days    Time spent: 52 minutes spent on 03/08/2024 caring for this patient face-to-face including chart review, ordering labs/tests, documenting, discussion with nursing staff, consultants, updating family and interview/physical exam    Camellia PARAS [place], DO [facility] Available via Epic secure chat 7am-7pm After these hours, please refer to coverage provider listed on [email or web address] 03/08/2024, 11:43 AM

## 2024-03-08 NOTE — Plan of Care (Signed)

## 2024-03-08 NOTE — Telephone Encounter (Signed)
 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 I was able to assist him, with his form. I faxed off the updated form and waited for fax confirmation. No questions or concerns to be noted at this time.

## 2024-03-08 NOTE — Plan of Care (Signed)
  Problem: Clinical Measurements: Goal: Diagnostic test results will improve Outcome: Progressing   Problem: Activity: Goal: Risk for activity intolerance will decrease Outcome: Progressing   Problem: Elimination: Goal: Will not experience complications related to urinary retention Outcome: Progressing   Problem: Pain Managment: Goal: General experience of comfort will improve and/or be controlled Outcome: Progressing

## 2024-03-09 DIAGNOSIS — M059 Rheumatoid arthritis with rheumatoid factor, unspecified: Secondary | ICD-10-CM | POA: Diagnosis not present

## 2024-03-09 DIAGNOSIS — R6510 Systemic inflammatory response syndrome (SIRS) of non-infectious origin without acute organ dysfunction: Secondary | ICD-10-CM | POA: Diagnosis not present

## 2024-03-09 DIAGNOSIS — E876 Hypokalemia: Secondary | ICD-10-CM | POA: Diagnosis not present

## 2024-03-09 DIAGNOSIS — C189 Malignant neoplasm of colon, unspecified: Secondary | ICD-10-CM | POA: Diagnosis not present

## 2024-03-09 DIAGNOSIS — R509 Fever, unspecified: Secondary | ICD-10-CM | POA: Diagnosis not present

## 2024-03-09 LAB — MAGNESIUM: Magnesium: 2.1 mg/dL (ref 1.7–2.4)

## 2024-03-09 LAB — CBC
HCT: 28 % — ABNORMAL LOW (ref 36.0–46.0)
Hemoglobin: 9.1 g/dL — ABNORMAL LOW (ref 12.0–15.0)
MCH: 28.1 pg (ref 26.0–34.0)
MCHC: 32.5 g/dL (ref 30.0–36.0)
MCV: 86.4 fL (ref 80.0–100.0)
Platelets: 174 K/uL (ref 150–400)
RBC: 3.24 MIL/uL — ABNORMAL LOW (ref 3.87–5.11)
RDW: 16.4 % — ABNORMAL HIGH (ref 11.5–15.5)
WBC: 2.7 K/uL — ABNORMAL LOW (ref 4.0–10.5)
nRBC: 0 % (ref 0.0–0.2)

## 2024-03-09 LAB — BASIC METABOLIC PANEL WITH GFR
Anion gap: 8 (ref 5–15)
BUN: 9 mg/dL (ref 6–20)
CO2: 25 mmol/L (ref 22–32)
Calcium: 8.6 mg/dL — ABNORMAL LOW (ref 8.9–10.3)
Chloride: 106 mmol/L (ref 98–111)
Creatinine, Ser: 0.83 mg/dL (ref 0.44–1.00)
GFR, Estimated: >60 mL/min (ref >60)
Glucose, Bld: 110 mg/dL — ABNORMAL HIGH (ref 70–99)
Potassium: 3.7 mmol/L (ref 3.5–5.1)
Sodium: 139 mmol/L (ref 135–145)

## 2024-03-09 LAB — C-REACTIVE PROTEIN: CRP: 0.7 mg/dL (ref <1.0)

## 2024-03-09 LAB — SEDIMENTATION RATE: Sed Rate: 24 mm/h — ABNORMAL HIGH (ref 0–22)

## 2024-03-09 MED ORDER — AMOXICILLIN-POT CLAVULANATE 875-125 MG PO TABS: 1.0000 | ORAL_TABLET | Freq: Two times a day (BID) | ORAL | 0 refills | Status: AC

## 2024-03-09 MED ORDER — HEPARIN SOD (PORK) LOCK FLUSH 100 UNIT/ML IV SOLN
500.0000 [IU] | Freq: Once | INTRAVENOUS | Status: AC
  Administered 2024-03-09: 500 [IU] via INTRAVENOUS
  Filled 2024-03-09: qty 5

## 2024-03-09 MED ORDER — FLUTICASONE PROPIONATE 50 MCG/ACT NA SUSP: 2.0000 | Freq: Every day | NASAL | 0 refills | Status: AC

## 2024-03-09 MED ORDER — CETIRIZINE HCL 10 MG PO TABS: 10.0000 mg | ORAL_TABLET | Freq: Every day | ORAL | 0 refills | Status: AC

## 2024-03-09 NOTE — Plan of Care (Signed)
  Problem: Education: Goal: Knowledge of General Education information will improve Description: Including pain rating scale, medication(s)/side effects and non-pharmacologic comfort measures Outcome: Adequate for Discharge   Problem: Health Behavior/Discharge Planning: Goal: Ability to manage health-related needs will improve Outcome: Adequate for Discharge   Problem: Clinical Measurements: Goal: Ability to maintain clinical measurements within normal limits will improve Outcome: Adequate for Discharge Goal: Will remain free from infection Outcome: Adequate for Discharge Goal: Respiratory complications will improve Outcome: Adequate for Discharge Goal: Cardiovascular complication will be avoided Outcome: Adequate for Discharge   Problem: Activity: Goal: Risk for activity intolerance will decrease Outcome: Adequate for Discharge   Problem: Nutrition: Goal: Adequate nutrition will be maintained Outcome: Adequate for Discharge   Problem: Coping: Goal: Level of anxiety will decrease Outcome: Adequate for Discharge   Problem: Elimination: Goal: Will not experience complications related to bowel motility Outcome: Adequate for Discharge Goal: Will not experience complications related to urinary retention Outcome: Adequate for Discharge   Problem: Pain Managment: Goal: General experience of comfort will improve and/or be controlled Outcome: Adequate for Discharge   Problem: Safety: Goal: Ability to remain free from injury will improve Outcome: Adequate for Discharge   Problem: Skin Integrity: Goal: Risk for impaired skin integrity will decrease Outcome: Adequate for Discharge

## 2024-03-09 NOTE — Discharge Summary (Signed)
 Physician Discharge Summary  Rexann Lueras FMW:[id number] DOB: [birth year] DOA: 03/06/2024  PCP: Sun, Vyvyan, MD  Admit date: 03/06/2024 Discharge date: 03/09/2024  Admitted From: Home Disposition: Home  Recommendations for Outpatient Follow-up:  Follow up with PCP in 1-2 weeks Outpatient follow-up with neurology as scheduled Continue Augmentin to complete 7-day course for acute bacterial sinusitis  Home Health: No Equipment/Devices: None  Discharge Condition: Stable CODE STATUS: Full code Diet recommendation:   History of present illness:  Danyelle Brookover is a 48 y.o. female with past medical history significant for rheumatoid arthritis, hidradenitis suppurativa, asthma, allergic rhinitis, history of migraine headache, invasive adenocarcinoma of the colon s/p hemicolectomy 01/19/2024 on chemotherapy who presented to [facility] ED on 03/06/2024 with complaints of nausea, vomiting, chills and fever.  Patient called her oncologist who advised to present to the ED for further evaluation.  Has been eating a low fiber diet since surgery, reportedly ate some peas, rice and salmon yesterday which are not new foods for her.  She has been having constipation over the last 5 days, took a Fleet enema and reports 2 good bowel movements that made her feel improved.  Does endorse some mild nasal discharge, frontal headache with concern for possible sinusitis.  Denies chest pain, no shortness of breath, no abdominal pain, no diarrhea, no dizziness, no cough, no urinary symptoms.   In the ED, temperature 102.0 F, HR 131, RR 16, BP 126/78, SpO2 98% on room air.  WBC 4.7, hemoglobin 11.1, platelet count 251.  Sodium 137, potassium 3.2, chloride 101, CO2 24, glucose 102, BUN 14, creatinine 0.92.  AST 18, ALT 19, total bilirubin 0.9.  Lactic acid 0.9.  hCG negative.  COVID/RSV/influenza PCR negative.  Respiratory viral panel negative.  Urinalysis unrevealing.  Chest x-ray with no acute cardiopulmonary  disease process.  CT abdomen/pelvis with contrast with no acute intraabdominal or pelvic pathology, no evidence of recurrent or metastatic disease; right hemicolectomy with ileocolic anastomosis with no bowel obstruction.  Patient received IV fluids, IV Zofran , started on IV vancomycin  and Zosyn .  TRH consulted for admission for further evaluation management of fever.  Hospital course:  Fever Concern for acute sinusitis Patient presenting with fever.  Temperature 102.0 F on arrival with elevated heart rate 131.  WBC count within normal limits.  Complicated by active chemotherapy. Workup unrevealing with normal lactic acid, COVID/RSV/influenza PCR and respiratory viral panel negative.  Urinalysis unrevealing and chest x-ray with no acute cardiopulmonary disease process.  CT abdomen/pelvis with no acute intra-abdominal/pelvic pathology.  MRSA PCR negative.  Patient complaining of frontal headache and nasal drainage, concern for possible bacterial sinusitis.  Initially started on empiric antibiotics with vancomycin  and Zosyn . MRSA PCR negative, discontinued vancomycin  7/30.  Procalcitonin less than 0.10, reassuring.  Blood culture showed no growth during hospitalization.  Fever curve improved/resolved.  Will will discharge on Augmentin to complete 7-day course for presumed acute bacterial sinusitis.  Continue Flonase and Zyrtec.  Outpatient follow-up with PCP.    Hypokalemia: Resolved Repleted during hospitalization.  Potassium 3.7 at time of discharge.   Invasive adenocarcinoma of the colon Patient underwent hemicolectomy 01/19/2024, currently on chemotherapy with FOLFOX q. 14 days x 6 months.  Last chemotherapy on 02/27/2024.  Continue outpatient follow-up with medical oncology, Dr. Autumn.    Rheumatoid arthritis On methotrexate weekly outpatient.  Currently on hold.  Outpatient follow-up with rheumatology.   Asthma, stable Albuterol  as needed   Allergic rhinitis Singulair  10 mg p.o. daily,  Zyrtec   Constipation Continue Colace  twice daily, MiraLAX  as needed    Discharge Diagnoses:  Principal Problem:   SIRS (systemic inflammatory response syndrome) (HCC) Active Problems:   Nausea and vomiting   Fever   Malignant neoplasm of colon [facility])    Discharge Instructions  Discharge Instructions     Call MD for:  difficulty breathing, headache or visual disturbances   Complete by: As directed    Call MD for:  extreme fatigue   Complete by: As directed    Call MD for:  persistant dizziness or light-headedness   Complete by: As directed    Call MD for:  persistant nausea and vomiting   Complete by: As directed    Call MD for:  severe uncontrolled pain   Complete by: As directed    Call MD for:  temperature >100.4   Complete by: As directed    Diet - low sodium heart healthy   Complete by: As directed    Increase activity slowly   Complete by: As directed       Allergies as of 03/09/2024       Reactions   Betadine [povidone Iodine] Rash   Blueberry [vaccinium Angustifolium] Itching   Milk-related Compounds Anaphylaxis, Other (See Comments)   No milk or other dairy products   Neosporin [neomycin-bacitracin Zn-polymyx] Other (See Comments)   Blistering rash   Povidone-iodine Dermatitis   Shellfish Allergy Anaphylaxis   Sulfa Antibiotics Rash, Dermatitis, Other (See Comments)   Neomycin Other (See Comments)   Other Other (See Comments)   5-Fluorouracil  (5-FU) = Bad muscle spasms in the jaws and tongue        Medication List     STOP taking these medications    diazepam  5 MG tablet Commonly known as: VALIUM    methotrexate 2.5 MG tablet Commonly known as: RHEUMATREX       TAKE these medications    amoxicillin-clavulanate 875-125 MG tablet Commonly known as: AUGMENTIN Take 1 tablet by mouth 2 (two) times daily for 4 days.   cetirizine 10 MG tablet Commonly known as: ZyrTEC Allergy Take 1 tablet (10 mg total) by mouth at bedtime.   Colace 100  MG capsule Generic drug: docusate sodium  Take 200 mg by mouth in the morning and at bedtime. What changed: Another medication with the same name was removed. Continue taking this medication, and follow the directions you see here.   dexamethasone  4 MG tablet Commonly known as: DECADRON  Take 2 tablets (8 mg total) by mouth daily. Start the day after chemotherapy for 2 days. Take with food.   diphenhydrAMINE  25 MG tablet Commonly known as: BENADRYL  Take 1 tablet (25 mg total) by mouth every 6 (six) hours as needed. What changed: reasons to take this   famotidine  20 MG tablet Commonly known as: PEPCID  Take 1 tablet (20 mg total) by mouth 2 (two) times daily. What changed:  when to take this reasons to take this   fluticasone 50 MCG/ACT nasal spray Commonly known as: FLONASE Place 2 sprays into both nostrils daily. What changed:  when to take this reasons to take this   folic acid  1 MG tablet Commonly known as: FOLVITE  Take 1 mg by mouth daily.   lidocaine -prilocaine  cream Commonly known as: EMLA  Apply to affected area once What changed:  how much to take how to take this when to take this reasons to take this additional instructions   mometasone 50 MCG/ACT nasal spray Commonly known as: NASONEX Place 2 sprays into the nose daily as needed (  Allergies).   montelukast  10 MG tablet Commonly known as: SINGULAIR  Take 10 mg by mouth at bedtime.   multivitamin with minerals Tabs tablet Take 1 tablet by mouth daily.   ondansetron  8 MG tablet Commonly known as: Zofran  Take 1 tablet (8 mg total) by mouth every 8 (eight) hours as needed for nausea or vomiting. Start on the third day after chemotherapy.   oxyCODONE  5 MG immediate release tablet Commonly known as: Oxy IR/ROXICODONE  Take 1 tablet (5 mg total) by mouth every 8 (eight) hours as needed for severe pain (pain score 7-10) (5 mg for pain score 4-6, 10 mg for pain score 7-10).   pantoprazole  20 MG tablet Commonly  known as: PROTONIX  Take 20 mg by mouth 2 (two) times daily. What changed:  when to take this reasons to take this   polyethylene glycol powder 17 GM/SCOOP powder Commonly known as: GLYCOLAX /MIRALAX  Take 17 g by mouth daily as needed. What changed: when to take this   prochlorperazine  10 MG tablet Commonly known as: COMPAZINE  Take 1 tablet (10 mg total) by mouth every 6 (six) hours as needed for nausea or vomiting.   TYLENOL  500 MG tablet Generic drug: acetaminophen  Take 500-1,000 mg by mouth every 6 (six) hours as needed for mild pain (pain score 1-3), headache or fever.   Vitamin B-Complex Tabs Take 1 tablet by mouth daily with supper.        Allergies  Allergen Reactions   Betadine [Povidone Iodine] Rash   Blueberry [Vaccinium Angustifolium] Itching   Milk-Related Compounds Anaphylaxis and Other (See Comments)    No milk or other dairy products   Neosporin [Neomycin-Bacitracin Zn-Polymyx] Other (See Comments)    Blistering rash   Povidone-Iodine Dermatitis   Shellfish Allergy Anaphylaxis   Sulfa Antibiotics Rash, Dermatitis and Other (See Comments)   Neomycin Other (See Comments)   Other Other (See Comments)    5-Fluorouracil  (5-FU) = Bad muscle spasms in the jaws and tongue    Consultations: None   Procedures/Studies: CT ABDOMEN PELVIS W CONTRAST Result Date: 03/06/2024 CLINICAL DATA:  History of colorectal cancer status post resection. EXAM: CT ABDOMEN AND PELVIS WITH CONTRAST TECHNIQUE: Multidetector CT imaging of the abdomen and pelvis was performed using the standard protocol following bolus administration of intravenous contrast. RADIATION DOSE REDUCTION: This exam was performed according to the departmental dose-optimization program which includes automated exposure control, adjustment of the mA and/or kV according to patient size and/or use of iterative reconstruction technique. CONTRAST:  OMNIPAQUE  IOHEXOL  300 MG/ML  SOLN COMPARISON:  CT abdomen pelvis  dated 01/16/2024. FINDINGS: Lower chest: The visualized lung bases are clear. No intra-abdominal free air or free fluid. Hepatobiliary: The liver is unremarkable. The gallbladder is unremarkable. No biliary dilatation. Pancreas: Unremarkable. No pancreatic ductal dilatation or surrounding inflammatory changes. Spleen: Normal in size without focal abnormality. Adrenals/Urinary Tract: The adrenal glands unremarkable the kidneys, visualized ureters, and urinary bladder appear unremarkable. Stomach/Bowel: Right hemicolectomy with ileocolic anastomosis. There is no bowel obstruction or active inflammation. Vascular/Lymphatic: The abdominal aorta and IVC unremarkable. No portal venous gas. There is no adenopathy. Reproductive: The uterus is anteverted. No suspicious adnexal masses. Other: None Musculoskeletal: No acute or significant osseous findings. IMPRESSION: 1. No acute intra-abdominal or pelvic pathology. No evidence of recurrent or metastatic disease. 2. Right hemicolectomy with ileocolic anastomosis. No bowel obstruction. The Electronically Signed   By: Vanetta Chou M.D.   On: 03/06/2024 17:19   DG Chest 2 View Result Date: 03/06/2024 CLINICAL DATA:  Questionable sepsis EXAM: CHEST - 2 VIEW COMPARISON:  None Available. FINDINGS: Port in the anterior chest wall with tip in distal SVC. Normal mediastinum and cardiac silhouette. Normal pulmonary vasculature. No evidence of effusion, infiltrate, or pneumothorax. No acute bony abnormality. IMPRESSION: No acute cardiopulmonary process. Electronically Signed   By: Jackquline Boxer M.D.   On: 03/06/2024 15:35     Subjective: Patient seen examined bedside, lying in bed.  Family present at bedside.  Has been afebrile past 24 hours.  Discussed discharging home on Augmentin to complete 7-day course for acute bacterial sinusitis.  Also encouraged continued use of her Zyrtec and Flonase.  Family concerned about  cleanliness of utilizing Flonase; discussed may  clean applicator tip with alcohol pad prior to use.  Patient with no other specific questions, concerns or complaints at this time.  Denies visual changes, no chest pain, no shortness of breath, no abdominal pain, no current fever, no chills/night sweats, no nausea/vomiting/diarrhea, no focal weakness, no fatigue, no paresthesias.  No acute events overnight per nursing staff.  Discharge Exam: Vitals:   03/08/24 1933 03/09/24 0404  BP: 101/63 109/70  Pulse: 77 78  Resp: 17 17  Temp: 98.2 F (36.8 C) 98.2 F (36.8 C)  SpO2: 100% 100%   Vitals:   03/08/24 0533 03/08/24 1212 03/08/24 1933 03/09/24 0404  BP: 111/73 100/63 101/63 109/70  Pulse: 73 70 77 78  Resp: 18 17 17 17   Temp: 98.3 F (36.8 C) 98.2 F (36.8 C) 98.2 F (36.8 C) 98.2 F (36.8 C)  TempSrc: Oral Oral    SpO2: 100% 100% 100% 100%  Weight:      Height:        Physical Exam: GEN: NAD, alert and oriented x 3, wd/wn HEENT: NCAT, PERRL, EOMI, sclera clear, MMM PULM: CTAB w/o wheezes/crackles, normal respiratory effort, on room air CV: RRR w/o M/G/R GI: abd soft, NTND, + BS MSK: no peripheral edema, moves all EXTR independently NEURO: No focal neurological deficit PSYCH: normal mood/affect Integumentary: No concerning rashes/lesions/wounds noted on exposed skin surfaces    The results of significant diagnostics from this hospitalization (including imaging, microbiology, ancillary and laboratory) are listed below for reference.     Microbiology: Recent Results (from the past 240 hours)  Resp panel by RT-PCR (RSV, Flu A&B, Covid) Anterior Nasal Swab     Status: None   Collection Time: 03/06/24  6:43 PM   Specimen: Anterior Nasal Swab  Result Value Ref Range Status   SARS Coronavirus 2 by RT PCR NEGATIVE NEGATIVE Final    Comment: (NOTE) SARS-CoV-2 target nucleic acids are NOT DETECTED.  The SARS-CoV-2 RNA is generally detectable in upper respiratory specimens during the acute phase of infection. The  lowest concentration of SARS-CoV-2 viral copies this assay can detect is 138 copies/mL. A negative result does not preclude SARS-Cov-2 infection and should not be used as the sole basis for treatment or other patient management decisions. A negative result may occur with  improper specimen collection/handling, submission of specimen other than nasopharyngeal swab, presence of viral mutation(s) within the areas targeted by this assay, and inadequate number of viral copies(<138 copies/mL). A negative result must be combined with clinical observations, patient history, and epidemiological information. The expected result is Negative.  Fact Sheet for Patients:  BloggerCourse.com  Fact Sheet for Healthcare Providers:  SeriousBroker.it  This test is no t yet approved or cleared by the [place]  FDA and  has been authorized for detection and/or diagnosis of SARS-CoV-2  by FDA under an Emergency Use Authorization (EUA). This EUA will remain  in effect (meaning this test can be used) for the duration of the COVID-19 declaration under Section 564(b)(1) of the Act, 21 U.S.C.section 360bbb-3(b)(1), unless the authorization is terminated  or revoked sooner.       Influenza A by PCR NEGATIVE NEGATIVE Final   Influenza B by PCR NEGATIVE NEGATIVE Final    Comment: (NOTE) The Xpert Xpress SARS-CoV-2/FLU/RSV plus assay is intended as an aid in the diagnosis of influenza from Nasopharyngeal swab specimens and should not be used as a sole basis for treatment. Nasal washings and aspirates are unacceptable for Xpert Xpress SARS-CoV-2/FLU/RSV testing.  Fact Sheet for Patients: BloggerCourse.com  Fact Sheet for Healthcare Providers: SeriousBroker.it  This test is not yet approved or cleared by the [place]  FDA and has been authorized for detection and/or diagnosis of SARS-CoV-2 by FDA under  an Emergency Use Authorization (EUA). This EUA will remain in effect (meaning this test can be used) for the duration of the COVID-19 declaration under Section 564(b)(1) of the Act, 21 U.S.C. section 360bbb-3(b)(1), unless the authorization is terminated or revoked.     Resp Syncytial Virus by PCR NEGATIVE NEGATIVE Final    Comment: (NOTE) Fact Sheet for Patients: BloggerCourse.com  Fact Sheet for Healthcare Providers: SeriousBroker.it  This test is not yet approved or cleared by the [place]  FDA and has been authorized for detection and/or diagnosis of SARS-CoV-2 by FDA under an Emergency Use Authorization (EUA). This EUA will remain in effect (meaning this test can be used) for the duration of the COVID-19 declaration under Section 564(b)(1) of the Act, 21 U.S.C. section 360bbb-3(b)(1), unless the authorization is terminated or revoked.  Performed at [facility], [address], [place], [place] [address]   Respiratory (~20 pathogens) panel by PCR     Status: None   Collection Time: 03/06/24  8:03 PM   Specimen: Nasopharyngeal Swab; Respiratory  Result Value Ref Range Status   Adenovirus NOT DETECTED NOT DETECTED Final   Coronavirus 229E NOT DETECTED NOT DETECTED Final    Comment: (NOTE) The Coronavirus on the Respiratory Panel, DOES NOT test for the novel  Coronavirus (2019 nCoV)    Coronavirus HKU1 NOT DETECTED NOT DETECTED Final   Coronavirus NL63 NOT DETECTED NOT DETECTED Final   Coronavirus OC43 NOT DETECTED NOT DETECTED Final   Metapneumovirus NOT DETECTED NOT DETECTED Final   Rhinovirus / Enterovirus NOT DETECTED NOT DETECTED Final   Influenza A NOT DETECTED NOT DETECTED Final   Influenza B NOT DETECTED NOT DETECTED Final   Parainfluenza Virus 1 NOT DETECTED NOT DETECTED Final   Parainfluenza Virus 2 NOT DETECTED NOT DETECTED Final   Parainfluenza Virus 3 NOT DETECTED NOT DETECTED Final    Parainfluenza Virus 4 NOT DETECTED NOT DETECTED Final   Respiratory Syncytial Virus NOT DETECTED NOT DETECTED Final   Bordetella pertussis NOT DETECTED NOT DETECTED Final   Bordetella Parapertussis NOT DETECTED NOT DETECTED Final   Chlamydophila pneumoniae NOT DETECTED NOT DETECTED Final   Mycoplasma pneumoniae NOT DETECTED NOT DETECTED Final    Comment: Performed at [facility], [address] [place], [place] [address]  Blood Culture (routine x 2)     Status: None (Preliminary result)   Collection Time: 03/07/24  6:15 AM   Specimen: BLOOD LEFT ARM  Result Value Ref Range Status   Specimen Description   Final    BLOOD LEFT ARM Performed at [facility]  Lab, [address] [place], [place] [address]    Special Requests   Final    BOTTLES DRAWN AEROBIC AND ANAEROBIC Blood Culture adequate volume Performed at [facility], [address], [place], [place] [address]    Culture   Final    NO GROWTH 2 DAYS Performed at [facility], [address] [place], [place] [address]    Report Status PENDING  Incomplete  MRSA Next Gen by PCR, Nasal     Status: None   Collection Time: 03/07/24  6:52 AM   Specimen: Nasal Mucosa; Nasal Swab  Result Value Ref Range Status   MRSA by PCR Next Gen NOT DETECTED NOT DETECTED Final    Comment: (NOTE) The GeneXpert MRSA Assay (FDA approved for NASAL specimens only), is one component of a comprehensive MRSA colonization surveillance program. It is not intended to diagnose MRSA infection nor to guide or monitor treatment for MRSA infections. Test performance is not FDA approved in patients less than 33 years old. Performed at [facility], [address], [place], [place] [address]      Labs: BNP (last 3 results) No results for input(s): BNP in the last 8760 hours. Basic Metabolic Panel: Recent Labs  Lab 03/06/24 1418 03/07/24 0615 03/08/24 0640 03/09/24 0235  NA 137 136 137 139  K 3.2*  3.5 3.2* 3.7  CL 101 106 106 106  CO2 24 23 24 25   GLUCOSE 102* 99 98 110*  BUN 14 10 8 9   CREATININE 0.92 0.71 0.95 0.83  CALCIUM  9.2 8.0* 8.2* 8.6*  MG  --   --  2.0 2.1   Liver Function Tests: Recent Labs  Lab 03/06/24 1418  AST 18  ALT 19  ALKPHOS 42  BILITOT 0.9  PROT 7.7  ALBUMIN  3.8   No results for input(s): LIPASE, AMYLASE in the last 168 hours. No results for input(s): AMMONIA in the last 168 hours. CBC: Recent Labs  Lab 03/06/24 1418 03/07/24 0615 03/08/24 0640 03/09/24 0235  WBC 4.7 2.7* 2.4* 2.7*  NEUTROABS 4.1  --   --   --   HGB 11.1* 9.5* 9.4* 9.1*  HCT 34.0* 28.7* 28.9* 28.0*  MCV 85.4 86.2 86.5 86.4  PLT 251 199 192 174   Cardiac Enzymes: No results for input(s): CKTOTAL, CKMB, CKMBINDEX, TROPONINI in the last 168 hours. BNP: Invalid input(s): POCBNP CBG: No results for input(s): GLUCAP in the last 168 hours. D-Dimer No results for input(s): DDIMER in the last 72 hours. Hgb A1c No results for input(s): HGBA1C in the last 72 hours. Lipid Profile No results for input(s): CHOL, HDL, LDLCALC, TRIG, CHOLHDL, LDLDIRECT in the last 72 hours. Thyroid function studies No results for input(s): TSH, T4TOTAL, T3FREE, THYROIDAB in the last 72 hours.  Invalid input(s): FREET3 Anemia work up No results for input(s): VITAMINB12, FOLATE, FERRITIN, TIBC, IRON , RETICCTPCT in the last 72 hours. Urinalysis    Component Value Date/Time   COLORURINE YELLOW 03/06/2024 1809   APPEARANCEUR CLEAR 03/06/2024 1809   LABSPEC 1.030 03/06/2024 1809   PHURINE 7.0 03/06/2024 1809   GLUCOSEU NEGATIVE 03/06/2024 1809   HGBUR NEGATIVE 03/06/2024 1809   BILIRUBINUR NEGATIVE 03/06/2024 1809   KETONESUR NEGATIVE 03/06/2024 1809   PROTEINUR NEGATIVE 03/06/2024 1809   UROBILINOGEN 1.0 06/12/2008 2109   NITRITE NEGATIVE 03/06/2024 1809   LEUKOCYTESUR NEGATIVE 03/06/2024 1809   Sepsis Labs Recent Labs  Lab  03/06/24 1418 03/07/24 0615 03/08/24 0640 03/09/24 0235  WBC 4.7  2.7* 2.4* 2.7*   Microbiology Recent Results (from the past 240 hours)  Resp panel by RT-PCR (RSV, Flu A&B, Covid) Anterior Nasal Swab     Status: None   Collection Time: 03/06/24  6:43 PM   Specimen: Anterior Nasal Swab  Result Value Ref Range Status   SARS Coronavirus 2 by RT PCR NEGATIVE NEGATIVE Final    Comment: (NOTE) SARS-CoV-2 target nucleic acids are NOT DETECTED.  The SARS-CoV-2 RNA is generally detectable in upper respiratory specimens during the acute phase of infection. The lowest concentration of SARS-CoV-2 viral copies this assay can detect is 138 copies/mL. A negative result does not preclude SARS-Cov-2 infection and should not be used as the sole basis for treatment or other patient management decisions. A negative result may occur with  improper specimen collection/handling, submission of specimen other than nasopharyngeal swab, presence of viral mutation(s) within the areas targeted by this assay, and inadequate number of viral copies(<138 copies/mL). A negative result must be combined with clinical observations, patient history, and epidemiological information. The expected result is Negative.  Fact Sheet for Patients:  BloggerCourse.com  Fact Sheet for Healthcare Providers:  SeriousBroker.it  This test is no t yet approved or cleared by the [place]  FDA and  has been authorized for detection and/or diagnosis of SARS-CoV-2 by FDA under an Emergency Use Authorization (EUA). This EUA will remain  in effect (meaning this test can be used) for the duration of the COVID-19 declaration under Section 564(b)(1) of the Act, 21 U.S.C.section 360bbb-3(b)(1), unless the authorization is terminated  or revoked sooner.       Influenza A by PCR NEGATIVE NEGATIVE Final   Influenza B by PCR NEGATIVE NEGATIVE Final    Comment: (NOTE) The Xpert  Xpress SARS-CoV-2/FLU/RSV plus assay is intended as an aid in the diagnosis of influenza from Nasopharyngeal swab specimens and should not be used as a sole basis for treatment. Nasal washings and aspirates are unacceptable for Xpert Xpress SARS-CoV-2/FLU/RSV testing.  Fact Sheet for Patients: BloggerCourse.com  Fact Sheet for Healthcare Providers: SeriousBroker.it  This test is not yet approved or cleared by the [place]  FDA and has been authorized for detection and/or diagnosis of SARS-CoV-2 by FDA under an Emergency Use Authorization (EUA). This EUA will remain in effect (meaning this test can be used) for the duration of the COVID-19 declaration under Section 564(b)(1) of the Act, 21 U.S.C. section 360bbb-3(b)(1), unless the authorization is terminated or revoked.     Resp Syncytial Virus by PCR NEGATIVE NEGATIVE Final    Comment: (NOTE) Fact Sheet for Patients: BloggerCourse.com  Fact Sheet for Healthcare Providers: SeriousBroker.it  This test is not yet approved or cleared by the [place]  FDA and has been authorized for detection and/or diagnosis of SARS-CoV-2 by FDA under an Emergency Use Authorization (EUA). This EUA will remain in effect (meaning this test can be used) for the duration of the COVID-19 declaration under Section 564(b)(1) of the Act, 21 U.S.C. section 360bbb-3(b)(1), unless the authorization is terminated or revoked.  Performed at [facility], [address], [place], [place] [address]   Respiratory (~20 pathogens) panel by PCR     Status: None   Collection Time: 03/06/24  8:03 PM   Specimen: Nasopharyngeal Swab; Respiratory  Result Value Ref Range Status   Adenovirus NOT DETECTED NOT DETECTED Final   Coronavirus 229E NOT DETECTED NOT DETECTED Final    Comment: (NOTE) The Coronavirus on the Respiratory Panel, DOES NOT  test for the  novel  Coronavirus (2019 nCoV)    Coronavirus HKU1 NOT DETECTED NOT DETECTED Final   Coronavirus NL63 NOT DETECTED NOT DETECTED Final   Coronavirus OC43 NOT DETECTED NOT DETECTED Final   Metapneumovirus NOT DETECTED NOT DETECTED Final   Rhinovirus / Enterovirus NOT DETECTED NOT DETECTED Final   Influenza A NOT DETECTED NOT DETECTED Final   Influenza B NOT DETECTED NOT DETECTED Final   Parainfluenza Virus 1 NOT DETECTED NOT DETECTED Final   Parainfluenza Virus 2 NOT DETECTED NOT DETECTED Final   Parainfluenza Virus 3 NOT DETECTED NOT DETECTED Final   Parainfluenza Virus 4 NOT DETECTED NOT DETECTED Final   Respiratory Syncytial Virus NOT DETECTED NOT DETECTED Final   Bordetella pertussis NOT DETECTED NOT DETECTED Final   Bordetella Parapertussis NOT DETECTED NOT DETECTED Final   Chlamydophila pneumoniae NOT DETECTED NOT DETECTED Final   Mycoplasma pneumoniae NOT DETECTED NOT DETECTED Final    Comment: Performed at [facility], [address], [place], [place] [address]  Blood Culture (routine x 2)     Status: None (Preliminary result)   Collection Time: 03/07/24  6:15 AM   Specimen: BLOOD LEFT ARM  Result Value Ref Range Status   Specimen Description   Final    BLOOD LEFT ARM Performed at [facility], [address], [place], [place] [address]    Special Requests   Final    BOTTLES DRAWN AEROBIC AND ANAEROBIC Blood Culture adequate volume Performed at [facility], [address] [place], [place] [address]    Culture   Final    NO GROWTH 2 DAYS Performed at [facility], [address] [place], [place] [address]    Report Status PENDING  Incomplete  MRSA Next Gen by PCR, Nasal     Status: None   Collection Time: 03/07/24  6:52 AM   Specimen: Nasal Mucosa; Nasal Swab  Result Value Ref Range Status   MRSA by PCR Next Gen NOT DETECTED NOT DETECTED Final    Comment: (NOTE) The GeneXpert MRSA Assay (FDA approved for NASAL  specimens only), is one component of a comprehensive MRSA colonization surveillance program. It is not intended to diagnose MRSA infection nor to guide or monitor treatment for MRSA infections. Test performance is not FDA approved in patients less than 53 years old. Performed at [facility], [address], [place], [place] [address]      Time coordinating discharge: Over 30 minutes  SIGNED:   Camellia PARAS [place], DO  [facility] 03/09/2024, 10:23 AM

## 2024-03-09 NOTE — Plan of Care (Signed)
  Problem: Education: Goal: Knowledge of General Education information will improve Description: Including pain rating scale, medication(s)/side effects and non-pharmacologic comfort measures Outcome: Progressing   Problem: Clinical Measurements: Goal: Ability to maintain clinical measurements within normal limits will improve Outcome: Progressing Goal: Will remain free from infection Outcome: Progressing Goal: Diagnostic test results will improve Outcome: Completed/Met Goal: Respiratory complications will improve Outcome: Progressing Goal: Cardiovascular complication will be avoided Outcome: Progressing   Problem: Activity: Goal: Risk for activity intolerance will decrease Outcome: Progressing

## 2024-03-12 ENCOUNTER — Ambulatory Visit

## 2024-03-12 ENCOUNTER — Ambulatory Visit: Admitting: Nurse Practitioner

## 2024-03-12 ENCOUNTER — Other Ambulatory Visit

## 2024-03-12 ENCOUNTER — Other Ambulatory Visit: Payer: Self-pay | Admitting: Nurse Practitioner

## 2024-03-12 DIAGNOSIS — C180 Malignant neoplasm of cecum: Secondary | ICD-10-CM

## 2024-03-12 LAB — CULTURE, BLOOD (ROUTINE X 2)
Culture: NO GROWTH
Special Requests: ADEQUATE

## 2024-03-12 NOTE — Progress Notes (Unsigned)
 Patient Care Team: Sun, Vyvyan, MD as PCP - General (Family Medicine) Dasie Leonor CROME, MD as Consulting Physician (General Surgery) Autumn Millman, MD as Consulting Physician (Oncology) Mai Lynwood FALCON, MD as Consulting Physician (Rheumatology)  Clinic Day:  03/13/2024  Referring physician: Autumn Millman, MD  ASSESSMENT & PLAN:   Assessment & Plan: Adenocarcinoma of cecum [facility]) Please review oncology history for additional details and timeline of events.   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 MMR proficient.  Previously, her diagnosis, staging, prognosis, plan of care, and treatment options were discussed.  Reviewed NCCN guidelines. On 02/07/2024, staging CT of the chest showed no evidence of intrathoracic metastatic disease. Chemotherapy is planned to target residual microscopic disease. FOLFOX regimen, consisting of fluorouracil  and oxaliplatin , will be administered biweekly for six months. Potential side effects include nausea, diarrhea, myelosuppression, mucositis, cold sensitivity, and neuropathy. The goal is curative, emphasizing chemotherapy's role in recurrence prevention. Treatment timing will be adjusted to accommodate her work schedule and personal commitments. Chemotherapy dosage may be modified based on side effects and hematologic parameters.  She seemed to have suffered an allergic reaction to oxaliplatin  with tongue muscle twitching, difficulty speaking, and jaw muscle tightness, the day after chemo.  Discussed alternatives.  Plan is to proceed with reduced dose oxaliplatin  at 50% at a slower rate over 3 hours with extra allergy medicines.  She is scheduled to receive cycle 2 on 02/27/2024.  Also given progressive fatigue, we will skip 5-FU bolus from cycle 2 onwards. She experienced similar, but worse, side effects after cycle #2 FOLFOX, despite reduced dose oxaliplatin  to 50% and elimination of 5-FU bolus.  She  had visit to the ED on day 2 of treatment.  5-FU pump had to be discontinued early.  Symptoms began to improve 10 to 15 minutes after pump was discontinued.  She was then given injection of Benadryl  after which, symptoms nearly resolved. 03/06/2024 through 03/09/2024 -patient hospitalized due to SIRS.  Had mild neutropenia and fever of 102 F.  She had negative blood cultures, respiratory panel, and urinalysis.  Did receive broad-spectrum antibiotics and was discharged home with Augmentin .  She will finish later today or tomorrow (03/13/2024).  She is reluctant to proceed with cycle 3 FOLFOX due to multiple negative side effects.  She is especially wary of 5-FU.  Alternative of capecitabine and combined treatment with oxaliplatin  as CapeOx was discussed.  She understands  capecitabine and 5-FU have similar make-up and may cause similar reactions.  Ultimately, cycle 3 FOLFOX was held.  She will follow-up with Dr.Pasam on 03/23/2024. She was made aware of my absence in clinic on her return visit and she will be seen by Lacie Burton, NP and Powell Lessen, NP.  I will communicate recent developments with them for continuation of care. - She was previously referred to genetic counseling for genetic testing. - Monitor with circulating tumor DNA test and CT scans every three months for two years. -We will schedule colonoscopy one year post-surgery.    SIRS Patient hospitalized from 03/06/2024 through 03/09/2024 due to SIRS.  She presented to the ED with mild neutropenia and fever of 102 F.  Respiratory panel and blood cultures were negative.  Urinalysis was negative.  She did receive broad-spectrum antibiotics in hospital along with IV fluids.  Will finish up treatment with Augmentin  today or tomorrow.  Overall recovering well though fatigued.  Reluctant to restart chemotherapy, especially 5-FU.  Negative SE chemotherapy  Patient had a second visit to ED following cycle 2 due to chemotherapy with FOLFOX.  Pump infusion  with 5-FU discontinued early due to severe jaw clenching, muscle spasms of the tongue, and generalized muscle twitching.  She states her jaw was clenched so hard, she thought her teeth would break.  This was more severe reaction then cycle #1 despite decreased oxaliplatin  dose to 50% and elimination of 5-FU bolus.  Patient extremely reluctant to proceed with cycle 3 day 1 FOLFOX.  Declined treatment with 5-FU at all.  Still willing to proceed with oxaliplatin  with further dose reduction at slower rate.  Discussed with Dr. Lanny who saw the patient along with me.  Offered trial of capecitabine along with oxaliplatin  however, did advise patient capecitabine very similar to 5-FU and may have similar effects.  After much discussion, decided to hold cycle 3 FOLFOX.  Patient to follow-up with Dr. Autumn on 03/23/2024.  Iron  deficiency anemia Stable and slightly improved anemia with Hgb 9.9 and HCT 29.4.  Continue to check blood counts with each visit and treat deficiency as indicated.   Labs reviewed. - Mild and slightly improved anemia with Hgb 9.9 and HCT 29.4.  Improved WBC at 4.1. - CMP is unremarkable. Reviewed hospital record with patient.   -Negative blood cultures, respiratory panel, and urinalysis. - CT abdomen/pelvis with contrast negative for acute abnormalities.  No evidence of cancer recurrence or metastatic disease. After discussion, decision made to hold cycle 3 FOLFOX. Follow-up with Dr. Autumn 03/23/2024. Today's visit was shared with Dr. Lanny.    The patient understands the plans discussed today and is in agreement with them.  She knows to contact our office if she develops concerns prior to her next appointment.  I provided 30 minutes of face-to-face time during this encounter and > 50% was spent counseling as documented under my assessment and plan.    Powell FORBES Lessen, NP  [facility] - A DEPT OF [name] [address] [place] [address] Dept: [phone] Dept Fax: [phone]   No orders of the defined types were placed in this encounter.     CHIEF COMPLAINT:  CC: Adenocarcinoma of the cecum  Current Treatment: Chemotherapy FOLFOX with reduced dose oxaliplatin  to 50%, and 5-FU bolus removed.  INTERVAL HISTORY:  Kelsi is here today for repeat clinical assessment.  Patient last seen by Lacie, NP, on 02/27/2024.  She will begin cycle 3-day 1 on 03/14/2024.  Cycle 2 FOLFOX proceeded with oxaliplatin  reduced to 50% and elimination of 5-FU bolus.  Patient, again, had severe muscle spasms and clenching of the jaw.  She states clenching was so severe, she thought her teeth might break.  She experienced spasms of the tongue and generalized muscle twitching.  She proceeded to ED where 5-FU pump was discontinued early.  She states that approximately 10 minutes after pump was discontinued, symptoms began to ease.  She was then given IV Benadryl , and 10 minutes later, symptoms nearly resolved.  She is adamant symptoms are coming from 5-FU.  She has declined further administration of 5-FU, although she is willing to proceed with oxaliplatin .  Explained that oxaliplatin  needs to 5-FU in order to be effective.  Alternative in capecitabine was offered.  Similarities between 5-FU and capecitabine were explained and cross reaction is possible.  She does inquire about alternative treatments and would like to stay with Dr.  Autumn about these in the future.  She was then hospitalized from 03/06/2024 through 03/09/2024 due to SIRS.  She had mild neutropenia and fever of 102 F.  She had negative workup (blood cultures, respiratory panel, and urinalysis).  She states she continues to feel fatigued but is improving. She denies chest pain, chest pressure, or shortness of breath. She denies headaches or visual disturbances. She denies abdominal pain, nausea, vomiting, or changes in bowel or bladder habits.  She has experienced diarrhea  for the first few days after treatment which improved with time.  She has had tender gums.  She reports mild hair loss.  She has also experienced moderate neuropathy, especially in her fingers.  They are very cold sensitive.  Worse after her second cycle.  Has to have family members grab things from the fridge or freezer.  Needs to use gloves to touch things which are cold.  She denies fevers or chills since released from hospitalization. Her appetite is good. Her weight has been stable.  I have reviewed the past medical history, past surgical history, social history and family history with the patient and they are unchanged from previous note.  ALLERGIES:  is allergic to betadine [povidone iodine], blueberry [vaccinium angustifolium], milk-related compounds, neosporin [neomycin-bacitracin zn-polymyx], povidone-iodine, shellfish allergy, sulfa antibiotics, neomycin, and other.  MEDICATIONS:  Current Outpatient Medications  Medication Sig Dispense Refill   amoxicillin -clavulanate (AUGMENTIN ) 875-125 MG tablet Take 1 tablet by mouth 2 (two) times daily for 4 days. 8 tablet 0   B Complex Vitamins (VITAMIN B-COMPLEX) TABS Take 1 tablet by mouth daily with supper.     cetirizine  (ZYRTEC  ALLERGY) 10 MG tablet Take 1 tablet (10 mg total) by mouth at bedtime. 90 tablet 0   COLACE 100 MG capsule Take 200 mg by mouth in the morning and at bedtime.     dexamethasone  (DECADRON ) 4 MG tablet Take 2 tablets (8 mg total) by mouth daily. Start the day after chemotherapy for 2 days. Take with food. 30 tablet 1   diphenhydrAMINE  (BENADRYL ) 25 MG tablet Take 1 tablet (25 mg total) by mouth every 6 (six) hours as needed. (Patient taking differently: Take 25 mg by mouth every 6 (six) hours as needed for allergies or itching.) 30 tablet 0   famotidine  (PEPCID ) 20 MG tablet Take 1 tablet (20 mg total) by mouth 2 (two) times daily. (Patient taking differently: Take 20 mg by mouth 2 (two) times daily as needed for heartburn or  indigestion.) 30 tablet 0   fluticasone  (FLONASE ) 50 MCG/ACT nasal spray Place 2 sprays into both nostrils daily. 9.9 mL 0   folic acid  (FOLVITE ) 1 MG tablet Take 1 mg by mouth daily.     lidocaine -prilocaine  (EMLA ) cream Apply to affected area once (Patient taking differently: Apply 1 Application topically as needed (for port access).) 30 g 3   mometasone (NASONEX) 50 MCG/ACT nasal spray Place 2 sprays into the nose daily as needed (Allergies).     montelukast  (SINGULAIR ) 10 MG tablet Take 10 mg by mouth at bedtime.     Multiple Vitamin (MULTIVITAMIN WITH MINERALS) TABS tablet Take 1 tablet by mouth daily.     ondansetron  (ZOFRAN ) 8 MG tablet Take 1 tablet (8 mg total) by mouth every 8 (eight) hours as needed for nausea or vomiting. Start on the third day after chemotherapy. 30 tablet 1   oxyCODONE  (OXY IR/ROXICODONE ) 5 MG immediate release tablet Take 1 tablet (5 mg total) by mouth every 8 (eight) hours as needed for severe pain (pain score 7-10) (5  mg for pain score 4-6, 10 mg for pain score 7-10). 12 tablet 0   pantoprazole  (PROTONIX ) 20 MG tablet Take 20 mg by mouth 2 (two) times daily. (Patient taking differently: Take 20 mg by mouth 2 (two) times daily as needed for heartburn or indigestion.)     polyethylene glycol powder (GLYCOLAX /MIRALAX ) 17 GM/SCOOP powder Take 17 g by mouth daily as needed. (Patient taking differently: Take 17 g by mouth daily.) 238 g 0   prochlorperazine  (COMPAZINE ) 10 MG tablet Take 1 tablet (10 mg total) by mouth every 6 (six) hours as needed for nausea or vomiting. 60 tablet 2   TYLENOL  500 MG tablet Take 500-1,000 mg by mouth every 6 (six) hours as needed for mild pain (pain score 1-3), headache or fever.     Current Facility-Administered Medications  Medication Dose Route Frequency Provider Last Rate Last Admin   Fremanezumab -vfrm SOSY 225 mg  225 mg Subcutaneous Once Ines Onetha NOVAK, MD        HISTORY OF PRESENT ILLNESS:   Oncology History  Adenocarcinoma of  cecum (HCC)  01/17/2024 Initial Diagnosis   Adenocarcinoma of cecum (HCC)   01/25/2024 Cancer Staging   Staging form: Colon and Rectum, AJCC 8th Edition - Pathologic: Stage IIIB (pT3, pN1a, cM0) - Signed by Autumn Millman, MD on 02/15/2024 Total positive nodes: 1 Histologic grading system: 4 grade system Histologic grade (G): G2 Residual tumor (R): R0   02/15/2024 -  Chemotherapy   Patient is on Treatment Plan : COLORECTAL FOLFOX q14d x 3 months         REVIEW OF SYSTEMS:   Constitutional: Denies fevers, chills or abnormal weight loss. Fatigue.  Eyes: Denies blurriness of vision Ears, nose, mouth, throat, and face: Denies mucositis or sore throat Respiratory: Denies cough, dyspnea or wheezes Cardiovascular: Denies palpitation, chest discomfort or lower extremity swelling Gastrointestinal:  Denies nausea, heartburn or change in bowel habits. Diarrhea on day 1 and 2 of treatment.  Skin: Denies abnormal skin rashes.  Small spots of skin discoloration in the tips of her fingers. Lymphatics: Denies new lymphadenopathy or easy bruising Neurological:Denies numbness, tingling or new weaknesses Behavioral/Psych: Mood is stable, no new changes  All other systems were reviewed with the patient and are negative.   VITALS:   Today's Vitals   03/13/24 0950 03/13/24 0956  BP: 114/72   Pulse: 77   Resp: 14   Temp: 97.8 F (36.6 C)   TempSrc: Temporal   SpO2: 100%   Weight: 151 lb 11.2 oz (68.8 kg)   PainSc:  0-No pain   Body mass index is 25.24 kg/m.   Wt Readings from Last 3 Encounters:  03/13/24 151 lb 11.2 oz (68.8 kg)  03/06/24 149 lb (67.6 kg)  02/28/24 150 lb (68 kg)    Body mass index is 25.24 kg/m.  Performance status (ECOG): 2 - Symptomatic, <50% confined to bed  PHYSICAL EXAM:   GENERAL:alert, no distress and comfortable SKIN: skin color, texture, turgor are normal, no rashes or significant lesions EYES: normal, Conjunctiva are pink and non-injected, sclera  clear OROPHARYNX:no exudate, no erythema and lips, buccal mucosa, and tongue normal  NECK: supple, thyroid normal size, non-tender, without nodularity LYMPH:  no palpable lymphadenopathy in the cervical, axillary or inguinal LUNGS: clear to auscultation and percussion with normal breathing effort HEART: regular rate & rhythm and no murmurs and no lower extremity edema ABDOMEN:abdomen soft and normal bowel sounds.  There is mild tenderness, especially around umbilicus and surgical scar.  Scar appears to be healing well with no dehiscence or evidence of skin breakdown.  No evidence of infection. Musculoskeletal:no cyanosis of digits and no clubbing  NEURO: alert & oriented x 3 with fluent speech, no focal motor/sensory deficits  LABORATORY DATA:  I have reviewed the data as listed    Component Value Date/Time   NA 139 03/13/2024 0927   K 4.0 03/13/2024 0927   CL 106 03/13/2024 0927   CO2 28 03/13/2024 0927   GLUCOSE 98 03/13/2024 0927   BUN 6 03/13/2024 0927   CREATININE 0.70 03/13/2024 0927   CALCIUM  8.7 (L) 03/13/2024 0927   PROT 7.0 03/13/2024 0927   ALBUMIN  4.0 03/13/2024 0927   AST 23 03/13/2024 0927   ALT 22 03/13/2024 0927   ALKPHOS 38 03/13/2024 0927   BILITOT 0.3 03/13/2024 0927   GFRNONAA >60 03/13/2024 0927   GFRAA  06/12/2008 2052    >60        The eGFR has been calculated using the MDRD equation. This calculation has not been validated in all clinical    Lab Results  Component Value Date   WBC 4.1 03/13/2024   NEUTROABS 2.2 03/13/2024   HGB 9.9 (L) 03/13/2024   HCT 29.4 (L) 03/13/2024   MCV 82.8 03/13/2024   PLT 236 03/13/2024     RADIOGRAPHIC STUDIES: CT ABDOMEN PELVIS W CONTRAST Result Date: 03/06/2024 CLINICAL DATA:  History of colorectal cancer status post resection. EXAM: CT ABDOMEN AND PELVIS WITH CONTRAST TECHNIQUE: Multidetector CT imaging of the abdomen and pelvis was performed using the standard protocol following bolus administration of  intravenous contrast. RADIATION DOSE REDUCTION: This exam was performed according to the departmental dose-optimization program which includes automated exposure control, adjustment of the mA and/or kV according to patient size and/or use of iterative reconstruction technique. CONTRAST:  OMNIPAQUE  IOHEXOL  300 MG/ML  SOLN COMPARISON:  CT abdomen pelvis dated 01/16/2024. FINDINGS: Lower chest: The visualized lung bases are clear. No intra-abdominal free air or free fluid. Hepatobiliary: The liver is unremarkable. The gallbladder is unremarkable. No biliary dilatation. Pancreas: Unremarkable. No pancreatic ductal dilatation or surrounding inflammatory changes. Spleen: Normal in size without focal abnormality. Adrenals/Urinary Tract: The adrenal glands unremarkable the kidneys, visualized ureters, and urinary bladder appear unremarkable. Stomach/Bowel: Right hemicolectomy with ileocolic anastomosis. There is no bowel obstruction or active inflammation. Vascular/Lymphatic: The abdominal aorta and IVC unremarkable. No portal venous gas. There is no adenopathy. Reproductive: The uterus is anteverted. No suspicious adnexal masses. Other: None Musculoskeletal: No acute or significant osseous findings. IMPRESSION: 1. No acute intra-abdominal or pelvic pathology. No evidence of recurrent or metastatic disease. 2. Right hemicolectomy with ileocolic anastomosis. No bowel obstruction. The Electronically Signed   By: Vanetta Chou M.D.   On: 03/06/2024 17:19   DG Chest 2 View Result Date: 03/06/2024 CLINICAL DATA:  Questionable sepsis EXAM: CHEST - 2 VIEW COMPARISON:  None Available. FINDINGS: Port in the anterior chest wall with tip in distal SVC. Normal mediastinum and cardiac silhouette. Normal pulmonary vasculature. No evidence of effusion, infiltrate, or pneumothorax. No acute bony abnormality. IMPRESSION: No acute cardiopulmonary process. Electronically Signed   By: Jackquline Boxer M.D.   On: 03/06/2024 15:35    Addendum I have seen the patient, examined her. I agree with the assessment and and plan and have edited the notes.   Patient is scheduled for cycle 3 adjuvant FOLFOX for stage IIIB colon cancer.  She unfortunately had significant side effects from both cycle 1 and cycle 2 chemotherapy.  The symptoms she described resolved after stopping 5-FU pump infusion, makes likely related to 5-FU infusion.  Patient wants to continue oxaliplatin  alone, and does not want to restart 5-FU.  I explained to her that adjuvant oxaliplatin  alone is not beneficial, will check DPD deficiency, and we discussed reduced dose capecitabine or 5-FU, however patient is very reluctant to consider.  Will cancel her chemoinfusion this week, she will follow-up with Dr. Autumn in 1-2 weeks.  Given her recent negative ctDNA, her risk of recurrence is probably not very high.  I discussed cancer surveillance alone.  She wants to think about it.  All questions were answered.  I spent a total of 25 minutes for her visit.   Onita Mattock MD 03/13/2024

## 2024-03-12 NOTE — Assessment & Plan Note (Signed)
 Please review oncology history for additional details and timeline of events.    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 MMR proficient.   Previously I discussed diagnosis, staging, prognosis, plan of care, treatment options.  Reviewed NCCN guidelines.  On 02/07/2024, staging CT of the chest showed no evidence of intrathoracic metastatic disease.  Chemotherapy is planned to target residual microscopic disease. FOLFOX regimen, consisting of fluorouracil  and oxaliplatin , will be administered biweekly for six months. Potential side effects include nausea, diarrhea, myelosuppression, mucositis, cold sensitivity, and neuropathy. The goal is curative, emphasizing chemotherapy's role in recurrence prevention. Treatment timing will be adjusted to accommodate her work schedule and personal commitments. Chemotherapy dosage may be modified based on side effects and hematologic parameters.   She seemed to have suffered an allergic reaction to oxaliplatin  with tongue muscle twitching, difficulty speaking, and jaw muscle tightness, the day after chemo.  Discussed alternatives.  Plan is to proceed with reduced dose oxaliplatin  at 50% at a slower rate over 3 hours with extra allergy medicines.  She is scheduled to receive cycle 2 on 02/27/2024.  Also given progressive fatigue, we will skip 5-FU bolus from cycle 2 onwards.  She was made aware of my absence in clinic on her return visit and she will be seen by Lacie Burton, NP.  I will communicate recent developments with her for continuation of care.  - She was previously referred to genetic counseling for genetic testing.  - Monitor with circulating tumor DNA test and CT scans every three months for two years.  -We will schedule colonoscopy one year post-surgery.

## 2024-03-13 ENCOUNTER — Inpatient Hospital Stay (HOSPITAL_BASED_OUTPATIENT_CLINIC_OR_DEPARTMENT_OTHER): Admitting: Nurse Practitioner

## 2024-03-13 ENCOUNTER — Inpatient Hospital Stay: Attending: Oncology

## 2024-03-13 ENCOUNTER — Telehealth: Payer: Self-pay | Admitting: Oncology

## 2024-03-13 VITALS — BP 114/72 | HR 77 | Temp 97.8°F | Resp 14 | Wt 151.7 lb

## 2024-03-13 DIAGNOSIS — R6510 Systemic inflammatory response syndrome (SIRS) of non-infectious origin without acute organ dysfunction: Secondary | ICD-10-CM | POA: Insufficient documentation

## 2024-03-13 DIAGNOSIS — T451X5A Adverse effect of antineoplastic and immunosuppressive drugs, initial encounter: Secondary | ICD-10-CM | POA: Insufficient documentation

## 2024-03-13 DIAGNOSIS — R5383 Other fatigue: Secondary | ICD-10-CM | POA: Diagnosis not present

## 2024-03-13 DIAGNOSIS — Z95828 Presence of other vascular implants and grafts: Secondary | ICD-10-CM

## 2024-03-13 DIAGNOSIS — M62838 Other muscle spasm: Secondary | ICD-10-CM | POA: Insufficient documentation

## 2024-03-13 DIAGNOSIS — Z882 Allergy status to sulfonamides status: Secondary | ICD-10-CM | POA: Diagnosis not present

## 2024-03-13 DIAGNOSIS — Z5111 Encounter for antineoplastic chemotherapy: Secondary | ICD-10-CM | POA: Diagnosis present

## 2024-03-13 DIAGNOSIS — Z800 Family history of malignant neoplasm of digestive organs: Secondary | ICD-10-CM | POA: Diagnosis not present

## 2024-03-13 DIAGNOSIS — C180 Malignant neoplasm of cecum: Secondary | ICD-10-CM | POA: Diagnosis not present

## 2024-03-13 DIAGNOSIS — D509 Iron deficiency anemia, unspecified: Secondary | ICD-10-CM | POA: Insufficient documentation

## 2024-03-13 DIAGNOSIS — R112 Nausea with vomiting, unspecified: Secondary | ICD-10-CM | POA: Insufficient documentation

## 2024-03-13 DIAGNOSIS — Z79899 Other long term (current) drug therapy: Secondary | ICD-10-CM | POA: Diagnosis not present

## 2024-03-13 DIAGNOSIS — Z9049 Acquired absence of other specified parts of digestive tract: Secondary | ICD-10-CM | POA: Diagnosis not present

## 2024-03-13 DIAGNOSIS — R1031 Right lower quadrant pain: Secondary | ICD-10-CM | POA: Diagnosis not present

## 2024-03-13 DIAGNOSIS — R253 Fasciculation: Secondary | ICD-10-CM | POA: Insufficient documentation

## 2024-03-13 DIAGNOSIS — Z79630 Long term (current) use of alkylating agent: Secondary | ICD-10-CM | POA: Diagnosis not present

## 2024-03-13 DIAGNOSIS — R222 Localized swelling, mass and lump, trunk: Secondary | ICD-10-CM | POA: Insufficient documentation

## 2024-03-13 DIAGNOSIS — M069 Rheumatoid arthritis, unspecified: Secondary | ICD-10-CM | POA: Insufficient documentation

## 2024-03-13 DIAGNOSIS — Z888 Allergy status to other drugs, medicaments and biological substances status: Secondary | ICD-10-CM | POA: Insufficient documentation

## 2024-03-13 DIAGNOSIS — J45909 Unspecified asthma, uncomplicated: Secondary | ICD-10-CM | POA: Diagnosis not present

## 2024-03-13 DIAGNOSIS — G620 Drug-induced polyneuropathy: Secondary | ICD-10-CM | POA: Diagnosis not present

## 2024-03-13 DIAGNOSIS — R4781 Slurred speech: Secondary | ICD-10-CM | POA: Diagnosis not present

## 2024-03-13 LAB — CBC WITH DIFFERENTIAL (CANCER CENTER ONLY)
Abs Immature Granulocytes: 0.01 K/uL (ref 0.00–0.07)
Basophils Absolute: 0 K/uL (ref 0.0–0.1)
Basophils Relative: 1 %
Eosinophils Absolute: 0.1 K/uL (ref 0.0–0.5)
Eosinophils Relative: 3 %
HCT: 29.4 % — ABNORMAL LOW (ref 36.0–46.0)
Hemoglobin: 9.9 g/dL — ABNORMAL LOW (ref 12.0–15.0)
Immature Granulocytes: 0 %
Lymphocytes Relative: 36 %
Lymphs Abs: 1.5 K/uL (ref 0.7–4.0)
MCH: 27.9 pg (ref 26.0–34.0)
MCHC: 33.7 g/dL (ref 30.0–36.0)
MCV: 82.8 fL (ref 80.0–100.0)
Monocytes Absolute: 0.3 K/uL (ref 0.1–1.0)
Monocytes Relative: 7 %
Neutro Abs: 2.2 K/uL (ref 1.7–7.7)
Neutrophils Relative %: 53 %
Platelet Count: 236 K/uL (ref 150–400)
RBC: 3.55 MIL/uL — ABNORMAL LOW (ref 3.87–5.11)
RDW: 16 % — ABNORMAL HIGH (ref 11.5–15.5)
WBC Count: 4.1 K/uL (ref 4.0–10.5)
nRBC: 0 % (ref 0.0–0.2)

## 2024-03-13 LAB — CMP (CANCER CENTER ONLY)
ALT: 22 U/L (ref 0–44)
AST: 23 U/L (ref 15–41)
Albumin: 4 g/dL (ref 3.5–5.0)
Alkaline Phosphatase: 38 U/L (ref 38–126)
Anion gap: 5 (ref 5–15)
BUN: 6 mg/dL (ref 6–20)
CO2: 28 mmol/L (ref 22–32)
Calcium: 8.7 mg/dL — ABNORMAL LOW (ref 8.9–10.3)
Chloride: 106 mmol/L (ref 98–111)
Creatinine: 0.7 mg/dL (ref 0.44–1.00)
GFR, Estimated: >60 mL/min (ref >60)
Glucose, Bld: 98 mg/dL (ref 70–99)
Potassium: 4 mmol/L (ref 3.5–5.1)
Sodium: 139 mmol/L (ref 135–145)
Total Bilirubin: 0.3 mg/dL (ref 0.0–1.2)
Total Protein: 7 g/dL (ref 6.5–8.1)

## 2024-03-13 LAB — CULTURE, BLOOD (ROUTINE X 2): Culture: NO GROWTH

## 2024-03-13 LAB — PREGNANCY, URINE: Preg Test, Ur: NEGATIVE

## 2024-03-13 MED ORDER — SODIUM CHLORIDE 0.9% FLUSH
10.0000 mL | Freq: Once | INTRAVENOUS | Status: AC
Start: 2024-03-13 — End: 2024-03-13
  Administered 2024-03-13: 10 mL

## 2024-03-14 ENCOUNTER — Inpatient Hospital Stay

## 2024-03-14 ENCOUNTER — Encounter

## 2024-03-14 ENCOUNTER — Inpatient Hospital Stay: Admitting: Dietician

## 2024-03-16 ENCOUNTER — Inpatient Hospital Stay

## 2024-03-19 ENCOUNTER — Telehealth: Payer: Self-pay

## 2024-03-19 NOTE — Telephone Encounter (Signed)
 [facility] CSW Progress Note Late note  CSW spoke with patient on 8/08 following the cancellation of 8/06 appointments. Patient has had recent ED visits due to reactions following chemotherapy. Patient had recent visit to discuss treatment plans, but waiting to speak with primary oncologist. Patient discussed fears that have developed due to reactions. Csw provided verbal and emotional support, as developed fears is a rational response.   Follow Up Plan:  Patient will contact CSW with any support or resource needs and CSW will follow-up with patient by phone   Lizbeth Sprague, LCSW Clinical Social Worker [facility]

## 2024-03-23 ENCOUNTER — Inpatient Hospital Stay

## 2024-03-23 ENCOUNTER — Encounter: Payer: Self-pay | Admitting: Oncology

## 2024-03-23 ENCOUNTER — Inpatient Hospital Stay (HOSPITAL_BASED_OUTPATIENT_CLINIC_OR_DEPARTMENT_OTHER): Admitting: Oncology

## 2024-03-23 VITALS — BP 109/78 | HR 80 | Temp 98.1°F | Resp 15 | Wt 152.9 lb

## 2024-03-23 DIAGNOSIS — Z95828 Presence of other vascular implants and grafts: Secondary | ICD-10-CM

## 2024-03-23 DIAGNOSIS — C180 Malignant neoplasm of cecum: Secondary | ICD-10-CM | POA: Diagnosis not present

## 2024-03-23 LAB — CMP (CANCER CENTER ONLY)
ALT: 23 U/L (ref 0–44)
AST: 25 U/L (ref 15–41)
Albumin: 4.1 g/dL (ref 3.5–5.0)
Alkaline Phosphatase: 42 U/L (ref 38–126)
Anion gap: 6 (ref 5–15)
BUN: 10 mg/dL (ref 6–20)
CO2: 28 mmol/L (ref 22–32)
Calcium: 8.8 mg/dL — ABNORMAL LOW (ref 8.9–10.3)
Chloride: 103 mmol/L (ref 98–111)
Creatinine: 0.68 mg/dL (ref 0.44–1.00)
GFR, Estimated: >60 mL/min (ref >60)
Glucose, Bld: 77 mg/dL (ref 70–99)
Potassium: 3.8 mmol/L (ref 3.5–5.1)
Sodium: 137 mmol/L (ref 135–145)
Total Bilirubin: 0.5 mg/dL (ref 0.0–1.2)
Total Protein: 7 g/dL (ref 6.5–8.1)

## 2024-03-23 LAB — CBC WITH DIFFERENTIAL (CANCER CENTER ONLY)
Abs Immature Granulocytes: 0.01 K/uL (ref 0.00–0.07)
Basophils Absolute: 0 K/uL (ref 0.0–0.1)
Basophils Relative: 0 %
Eosinophils Absolute: 0.1 K/uL (ref 0.0–0.5)
Eosinophils Relative: 2 %
HCT: 30 % — ABNORMAL LOW (ref 36.0–46.0)
Hemoglobin: 10.1 g/dL — ABNORMAL LOW (ref 12.0–15.0)
Immature Granulocytes: 0 %
Lymphocytes Relative: 32 %
Lymphs Abs: 1.5 K/uL (ref 0.7–4.0)
MCH: 27.6 pg (ref 26.0–34.0)
MCHC: 33.7 g/dL (ref 30.0–36.0)
MCV: 82 fL (ref 80.0–100.0)
Monocytes Absolute: 0.4 K/uL (ref 0.1–1.0)
Monocytes Relative: 8 %
Neutro Abs: 2.8 K/uL (ref 1.7–7.7)
Neutrophils Relative %: 58 %
Platelet Count: 238 K/uL (ref 150–400)
RBC: 3.66 MIL/uL — ABNORMAL LOW (ref 3.87–5.11)
RDW: 16.5 % — ABNORMAL HIGH (ref 11.5–15.5)
WBC Count: 4.9 K/uL (ref 4.0–10.5)
nRBC: 0 % (ref 0.0–0.2)

## 2024-03-23 LAB — PREGNANCY, URINE: Preg Test, Ur: NEGATIVE

## 2024-03-23 MED ORDER — LIDOCAINE-PRILOCAINE 2.5-2.5 % EX CREA: TOPICAL_CREAM | CUTANEOUS | 3 refills | Status: AC

## 2024-03-23 MED ORDER — DEXAMETHASONE 4 MG PO TABS: 8.0000 mg | ORAL_TABLET | Freq: Every day | ORAL | 1 refills | Status: AC

## 2024-03-23 MED ORDER — SODIUM CHLORIDE 0.9% FLUSH
10.0000 mL | Freq: Once | INTRAVENOUS | Status: AC
  Administered 2024-03-23: 10 mL

## 2024-03-23 MED ORDER — PROCHLORPERAZINE MALEATE 10 MG PO TABS
10.0000 mg | ORAL_TABLET | Freq: Four times a day (QID) | ORAL | 1 refills | Status: DC | PRN
Start: 2024-03-23 — End: 2024-05-22

## 2024-03-23 MED ORDER — ONDANSETRON HCL 8 MG PO TABS: 8.0000 mg | ORAL_TABLET | Freq: Three times a day (TID) | ORAL | 1 refills | Status: DC | PRN

## 2024-03-23 MED ORDER — CAPECITABINE 500 MG PO TABS: ORAL_TABLET | ORAL | 7 refills | Status: DC

## 2024-03-23 NOTE — Progress Notes (Signed)
 ON PATHWAY REGIMEN - Colorectal  No Change  Continue With Treatment as Ordered.  Original Decision Date/Time: 01/25/2024 16:40     A cycle is every 14 days:     Oxaliplatin       Leucovorin       Fluorouracil       Fluorouracil    **Always confirm dose/schedule in your pharmacy ordering system**  Patient Characteristics: Postoperative without Neoadjuvant Therapy, M0 (Pathologic Staging), Colon, Stage III, Low Risk (pT1-3, pN1) Tumor Location: Colon Therapeutic Status: Postoperative without Neoadjuvant Therapy, M0 (Pathologic Staging) AJCC M Category: cM0 AJCC T Category: pT3 AJCC N Category: pN1a AJCC 8 Stage Grouping: IIIB Intent of Therapy: Curative Intent, Discussed with Patient

## 2024-03-23 NOTE — Progress Notes (Signed)
 DISCONTINUE ON PATHWAY REGIMEN - Colorectal     A cycle is every 14 days:     Oxaliplatin       Leucovorin       Fluorouracil       Fluorouracil    **Always confirm dose/schedule in your pharmacy ordering system**  PRIOR TREATMENT: [id number]: [id number] q14 Days x 3 Months  START ON PATHWAY REGIMEN - Colorectal     A cycle is every 21 days:     Capecitabine       Oxaliplatin    **Always confirm dose/schedule in your pharmacy ordering system**  Patient Characteristics: Postoperative without Neoadjuvant Therapy, M0 (Pathologic Staging), Colon, Stage III, Low Risk (pT1-3, pN1) Tumor Location: Colon Therapeutic Status: Postoperative without Neoadjuvant Therapy, M0 (Pathologic Staging) AJCC M Category: cM0 AJCC T Category: pT3 AJCC N Category: pN1a AJCC 8 Stage Grouping: IIIB Intent of Therapy: Curative Intent, Discussed with Patient

## 2024-03-23 NOTE — Progress Notes (Signed)
 [facility]  ONCOLOGY CLINIC PROGRESS NOTE   Patient Care Team: Sun, Vyvyan, MD as PCP - General (Family Medicine) Dasie Leonor CROME, MD as Consulting Physician (General Surgery) Autumn Millman, MD as Consulting Physician (Oncology) Mai Lynwood FALCON, MD as Consulting Physician (Rheumatology)  PATIENT NAME: Sherry Abbott   MR#: [id number] DOB: Jul 28, 1976  Date of visit: 03/23/2024   ASSESSMENT & PLAN:   Sherry Abbott is a 48 y.o. 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  pT3, pN1a, cM0, stage III B disease.  MMR proficient.  Side effects to 5-FU infusion with jaw clenching, muscle spasms of the tongue and generalized muscle twitching.  Adenocarcinoma of cecum [facility]) Please review oncology history for additional details and timeline of events.    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 MMR proficient.   Previously I discussed diagnosis, staging, prognosis, plan of care, treatment options.  Reviewed NCCN guidelines.  On 02/07/2024, staging CT of the chest showed no evidence of intrathoracic metastatic disease.  Started cycle 1 of adjuvant FOLFOX on 02/15/2024.  Initially it seemed as if she has suffered an allergic reaction to oxaliplatin  with tongue muscle twitching, difficulty speaking, and jaw muscle tightness, the day after chemo.  Discussed alternatives.  Plan made to proceed with reduced dose oxaliplatin  at 50% at a slower rate over 3 hours with extra allergy medicines including Benadryl  and Pepcid .   She received cycle 2 of FOLFOX without 5-FU pump and dose reduced oxaliplatin  at a slower rate on 02/27/2024.  On 02/28/2024, she had to present to the ED again  with similar complaints of tongue muscle twitching, jaw clenching etc.  Symptoms probably resolved after stopping 5-FU pump in the ED.  It is now clear that she has allergic reaction to 5-FU infusion.  We cannot use it anymore because of the severity of reaction.  Discussed options today.  Discussed alternative option of capecitabine , although definitive and has similar mechanism of action as 5-FU and she may suffer similar side effect profile.  Patient willing to try capecitabine  with oxaliplatin .  If she were to suffer similar side effects like before, then we will have to hold chemotherapy and continue close surveillance with Guardant reveal and CT imaging.  Will plan to do oxaliplatin  at half-strength at a slower rate with each cycle.  Tentative treatment in 1 week.  Discussed side effect profile with capecitabine  today including hand-foot syndrome, diarrhea, nausea, fatigue, cytopenias etc.  She will have formal education with our chemo pharmacist.  - She was previously referred to genetic counseling for genetic testing.  - Monitor with circulating tumor DNA test and CT scans every three months for two years.  Circulating tumor DNA test was negative on 02/06/2024.  -We will schedule colonoscopy one year post-surgery.  She wants to try to work remotely given the fact that she will be on immunosuppressive chemotherapy.  Provided a letter to reflect the same.  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 Capecitabine  is proposed as an alternative due to its different side effect profile. - Discontinue 5-FU due to severe allergic reaction. - Monitor  for any allergic reactions to capecitabine .  I reviewed lab results and outside records for this visit and discussed relevant results with the patient. Diagnosis,  plan of care and treatment options were also discussed in detail with the patient. Opportunity provided to ask questions and answers provided to her apparent satisfaction. Provided instructions to call our clinic with any problems, questions or concerns prior to return visit. I recommended to continue follow-up with PCP and sub-specialists. She verbalized understanding and agreed with the plan.   NCCN guidelines have been consulted in the planning of this patient's care.  I spent a total of 40 minutes during this encounter with the patient including review of chart and various tests results, discussions about plan of care and coordination of care plan.   Jaeleah Smyser, MD   [facility] - A DEPT OF [name] [address] [place] [address] Dept: [phone] Dept Fax: [phone]    CHIEF COMPLAINT/ REASON FOR VISIT:   Invasive adenocarcinoma of the cecum, status post hemicolectomy on 01/19/2024.  pT3, pN1a, cM0, stage III B disease.  MMR proficient.   Current Treatment: Adjuvant systemic chemotherapy with FOLFOX started from 02/15/2024.  INTERVAL HISTORY:    Discussed the use of AI scribe software for clinical note transcription with the patient, who gave verbal consent to proceed.  History of Present Illness  She started cycle 1 of FOLFOX on 02/15/2024.  On 02/16/2024, she presented to the ED with tongue muscle twitching, difficulty speaking, and jaw muscle tightness.  5-FU pump was stopped in the ED and she was given extra allergy medicines including Solu-Medrol , Pepcid  and Benadryl  as per our direction and symptoms resolved promptly.  She experienced sudden onset of muscle twitching, difficulty speaking, and jaw muscle tightness while in the kitchen helping her husband make lasagna. Her mouth clenched, and her tongue curled up like a 'U', making it difficult to open her mouth. Her speech progressively worsened to  the point where she could not speak, and her jaw started moving to the side, resembling symptoms of a stroke.  She has been experiencing adverse reactions to her chemotherapy regimen, specifically with the use of fluorouracil  (5-FU) administered via a pump. After starting the pump, she developed slurred speech, difficulty forming words, jaw clenching, and tongue rolling, which occurred over approximately 20 minutes during the first episode. The second episode was more severe and had a rapid onset, occurring about 24 hours after the pump was started.  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  She has a family history of colorectal cancer, as her father died from the disease, which had metastasized to his liver and lungs. She was previously diagnosed with stage III colorectal cancer.  She has a history of allergies and is concerned about her propensity to react to medications. She is currently managing her treatment schedule and work commitments.  I have reviewed the past medical history, past surgical history, social history and family history with the patient and they are unchanged from previous note.  HISTORY OF PRESENT ILLNESS:   ONCOLOGY HISTORY:   Patient had routine screening colonoscopy on 01/12/2024, performed by Dr. Saintclair.  It showed a fungating, infiltrative, sessile, polypoid and ulcerated nonobstructing large mass in the cecum and in the ascending colon.  The mass was partially circumferential involving two  thirds of the lumen circumference, measuring at least 7 cm in length.  Biopsies were obtained.  3 sessile polyps were found in the transverse colon, descending colon and sigmoid colon measuring up to 6 mm, removed.  Pathology from the cecal mass came back positive for invasive moderately differentiated adenocarcinoma.  MMR proficient.   On 01/16/2024, CT abdomen and pelvis  showed 2.8 cm cyst in the left hepatic lobe, simple appearing.  No pathologic lymphadenopathy or other acute findings were noted.   She was referred for surgical oncology evaluation.  She was supposed to see Dr. Ann on 01/17/2024 but presented to the ED that same day with complaints of worsening right lower quadrant abdominal pain, nausea, vomiting and inability to tolerate oral intake.  She was admitted to the hospital for further evaluation and management.   She was seen by Dr. Dasie in the hospital and she underwent laparoscopic-assisted right hemicolectomy, excision of peritoneal cyst on 01/19/2024.  Final pathology showed invasive moderately differentiated adenocarcinoma with focal mucinous differentiation arising within a tubulovillous adenoma, measuring 6.7 cm in greatest dimension with involvement of submucosa, muscularis propria and into pericolonic adipose tissue.  1 out of 26 lymph nodes were positive for malignancy.  There was another single positive tumor deposit.  All margins were negative.  Grade 2.  No evidence of LVI or PNI.  MMR proficient.     On her consultation with us  on 01/25/2024, request placed for CT of the chest for staging.  On 02/07/2024, staging CT of the chest showed no evidence of intrathoracic metastatic disease.  pT3,pN1a, cM0, Stage III B disease.    Plan made for adjuvant chemotherapy with modified FOLFOX-6, every 2 weeks for up to 12 cycles.  Started cycle 1 on 02/15/2024.  Initially it seemed as if she has suffered an allergic reaction to oxaliplatin  with tongue muscle twitching, difficulty speaking, and jaw muscle tightness, the day after chemo.  Discussed alternatives.  Plan made to proceed with reduced dose oxaliplatin  at 50% at a slower rate over 3 hours with extra allergy medicines including Benadryl  and Pepcid .   She received cycle 2 of FOLFOX without 5-FU pump and dose reduced oxaliplatin  at a slower rate on 02/27/2024.  On 02/28/2024, she had to present to the  ED again with similar complaints of tongue muscle twitching, jaw clenching etc.  Symptoms probably resolved after stopping 5-FU pump in the ED.  It is now clear that she has allergic reaction to 5-FU infusion.  We cannot use it anymore because of the severity of reaction.  Patient willing to try capecitabine  with oxaliplatin .  If she were to suffer similar side effects, then we will have to hold chemotherapy and continue close surveillance with Guardant reveal and CT imaging.   Oncology History  Adenocarcinoma of cecum (HCC)  01/17/2024 Initial Diagnosis   Adenocarcinoma of cecum (HCC)   01/25/2024 Cancer Staging   Staging form: Colon and Rectum, AJCC 8th Edition - Pathologic: Stage IIIB (pT3, pN1a, cM0) - Signed by Autumn Millman, MD on 02/15/2024 Total positive nodes: 1 Histologic grading system: 4 grade system Histologic grade (G): G2 Residual tumor (R): R0   02/15/2024 - 02/29/2024 Chemotherapy   Patient is on Treatment Plan : COLORECTAL FOLFOX q14d x 3 months     03/30/2024 -  Chemotherapy   Patient is on Treatment Plan : COLORECTAL Xelox (Capeox)(130/850) q21d         REVIEW OF SYSTEMS:   Review of Systems - Oncology  All other pertinent  systems were reviewed with the patient and are negative.  ALLERGIES: She is allergic to betadine [povidone iodine], blueberry [vaccinium angustifolium], milk-related compounds, neosporin [neomycin-bacitracin zn-polymyx], povidone-iodine, shellfish allergy, sulfa antibiotics, neomycin, and other.  MEDICATIONS:  Current Outpatient Medications  Medication Sig Dispense Refill   B Complex Vitamins (VITAMIN B-COMPLEX) TABS Take 1 tablet by mouth daily with supper.     capecitabine  (XELODA ) 500 MG tablet Take 3 tablets by mouth twice daily on days 1-14, followed by 7 days off.  Then repeat. 84 tablet 7   cetirizine  (ZYRTEC  ALLERGY) 10 MG tablet Take 1 tablet (10 mg total) by mouth at bedtime. 90 tablet 0   COLACE 100 MG capsule Take 200 mg by mouth  in the morning and at bedtime.     diphenhydrAMINE  (BENADRYL ) 25 MG tablet Take 1 tablet (25 mg total) by mouth every 6 (six) hours as needed. (Patient taking differently: Take 25 mg by mouth every 6 (six) hours as needed for allergies or itching.) 30 tablet 0   famotidine  (PEPCID ) 20 MG tablet Take 1 tablet (20 mg total) by mouth 2 (two) times daily. (Patient taking differently: Take 20 mg by mouth 2 (two) times daily as needed for heartburn or indigestion.) 30 tablet 0   fluticasone  (FLONASE ) 50 MCG/ACT nasal spray Place 2 sprays into both nostrils daily. 9.9 mL 0   folic acid  (FOLVITE ) 1 MG tablet Take 1 mg by mouth daily.     mometasone (NASONEX) 50 MCG/ACT nasal spray Place 2 sprays into the nose daily as needed (Allergies).     montelukast  (SINGULAIR ) 10 MG tablet Take 10 mg by mouth at bedtime.     Multiple Vitamin (MULTIVITAMIN WITH MINERALS) TABS tablet Take 1 tablet by mouth daily.     oxyCODONE  (OXY IR/ROXICODONE ) 5 MG immediate release tablet Take 1 tablet (5 mg total) by mouth every 8 (eight) hours as needed for severe pain (pain score 7-10) (5 mg for pain score 4-6, 10 mg for pain score 7-10). 12 tablet 0   pantoprazole  (PROTONIX ) 20 MG tablet Take 20 mg by mouth 2 (two) times daily. (Patient taking differently: Take 20 mg by mouth 2 (two) times daily as needed for heartburn or indigestion.)     polyethylene glycol powder (GLYCOLAX /MIRALAX ) 17 GM/SCOOP powder Take 17 g by mouth daily as needed. (Patient taking differently: Take 17 g by mouth daily.) 238 g 0   TYLENOL  500 MG tablet Take 500-1,000 mg by mouth every 6 (six) hours as needed for mild pain (pain score 1-3), headache or fever.     dexamethasone  (DECADRON ) 4 MG tablet Take 2 tablets (8 mg total) by mouth daily. Start the day after chemotherapy for 2 days. Take with food. 30 tablet 1   lidocaine -prilocaine  (EMLA ) cream Apply to affected area once 30 g 3   ondansetron  (ZOFRAN ) 8 MG tablet Take 1 tablet (8 mg total) by mouth every 8  (eight) hours as needed for nausea or vomiting. Start on the third day after chemotherapy. 30 tablet 1   prochlorperazine  (COMPAZINE ) 10 MG tablet Take 1 tablet (10 mg total) by mouth every 6 (six) hours as needed for nausea or vomiting. 30 tablet 1   Current Facility-Administered Medications  Medication Dose Route Frequency Provider Last Rate Last Admin   Fremanezumab -vfrm SOSY 225 mg  225 mg Subcutaneous Once Ines Onetha NOVAK, MD         VITALS:   Blood pressure 109/78, pulse 80, temperature 98.1 F (36.7 C), temperature source Temporal,  resp. rate 15, weight 152 lb 14.4 oz (69.4 kg), SpO2 100%.  Wt Readings from Last 3 Encounters:  03/23/24 152 lb 14.4 oz (69.4 kg)  03/13/24 151 lb 11.2 oz (68.8 kg)  03/06/24 149 lb (67.6 kg)    Body mass index is 25.44 kg/m.    Onc Performance Status - 03/23/24 1529       ECOG Perf Status   ECOG Perf Status Restricted in physically strenuous activity but ambulatory and able to carry out work of a light or sedentary nature, e.g., light house work, office work      KPS SCALE   KPS % SCORE Normal activity with effort, some s/s of disease           PHYSICAL EXAM:   Physical Exam Constitutional:      General: She is not in acute distress.    Appearance: Normal appearance.  HENT:     Head: Normocephalic and atraumatic.  Cardiovascular:     Rate and Rhythm: Normal rate.  Pulmonary:     Effort: Pulmonary effort is normal. No respiratory distress.  Chest:     Comments: Right-sided Port-A-Cath in place without any signs of infection Abdominal:     General: There is no distension.  Neurological:     General: No focal deficit present.     Mental Status: She is alert and oriented to person, place, and time.  Psychiatric:        Mood and Affect: Mood normal.        Behavior: Behavior normal.       LABORATORY DATA:   I have reviewed the data as listed.  Results for orders placed or performed in visit on 03/23/24  Pregnancy,  urine  Result Value Ref Range   Preg Test, Ur NEGATIVE NEGATIVE  CMP (Cancer Center only)  Result Value Ref Range   Sodium 137 135 - 145 mmol/L   Potassium 3.8 3.5 - 5.1 mmol/L   Chloride 103 98 - 111 mmol/L   CO2 28 22 - 32 mmol/L   Glucose, Bld 77 70 - 99 mg/dL   BUN 10 6 - 20 mg/dL   Creatinine 9.31 9.55 - 1.00 mg/dL   Calcium  8.8 (L) 8.9 - 10.3 mg/dL   Total Protein 7.0 6.5 - 8.1 g/dL   Albumin  4.1 3.5 - 5.0 g/dL   AST 25 15 - 41 U/L   ALT 23 0 - 44 U/L   Alkaline Phosphatase 42 38 - 126 U/L   Total Bilirubin 0.5 0.0 - 1.2 mg/dL   GFR, Estimated >39 >39 mL/min   Anion gap 6 5 - 15  CBC with Differential (Cancer Center Only)  Result Value Ref Range   WBC Count 4.9 4.0 - 10.5 K/uL   RBC 3.66 (L) 3.87 - 5.11 MIL/uL   Hemoglobin 10.1 (L) 12.0 - 15.0 g/dL   HCT 69.9 (L) 63.9 - 53.9 %   MCV 82.0 80.0 - 100.0 fL   MCH 27.6 26.0 - 34.0 pg   MCHC 33.7 30.0 - 36.0 g/dL   RDW 83.4 (H) 88.4 - 84.4 %   Platelet Count 238 150 - 400 K/uL   nRBC 0.0 0.0 - 0.2 %   Neutrophils Relative % 58 %   Neutro Abs 2.8 1.7 - 7.7 K/uL   Lymphocytes Relative 32 %   Lymphs Abs 1.5 0.7 - 4.0 K/uL   Monocytes Relative 8 %   Monocytes Absolute 0.4 0.1 - 1.0 K/uL   Eosinophils Relative 2 %  Eosinophils Absolute 0.1 0.0 - 0.5 K/uL   Basophils Relative 0 %   Basophils Absolute 0.0 0.0 - 0.1 K/uL   Immature Granulocytes 0 %   Abs Immature Granulocytes 0.01 0.00 - 0.07 K/uL       RADIOGRAPHIC STUDIES:  I have personally reviewed the radiological images as listed and agree with the findings in the report.  CT ABDOMEN PELVIS W CONTRAST Result Date: 03/06/2024 CLINICAL DATA:  History of colorectal cancer status post resection. EXAM: CT ABDOMEN AND PELVIS WITH CONTRAST TECHNIQUE: Multidetector CT imaging of the abdomen and pelvis was performed using the standard protocol following bolus administration of intravenous contrast. RADIATION DOSE REDUCTION: This exam was performed according to the  departmental dose-optimization program which includes automated exposure control, adjustment of the mA and/or kV according to patient size and/or use of iterative reconstruction technique. CONTRAST:  OMNIPAQUE  IOHEXOL  300 MG/ML  SOLN COMPARISON:  CT abdomen pelvis dated 01/16/2024. FINDINGS: Lower chest: The visualized lung bases are clear. No intra-abdominal free air or free fluid. Hepatobiliary: The liver is unremarkable. The gallbladder is unremarkable. No biliary dilatation. Pancreas: Unremarkable. No pancreatic ductal dilatation or surrounding inflammatory changes. Spleen: Normal in size without focal abnormality. Adrenals/Urinary Tract: The adrenal glands unremarkable the kidneys, visualized ureters, and urinary bladder appear unremarkable. Stomach/Bowel: Right hemicolectomy with ileocolic anastomosis. There is no bowel obstruction or active inflammation. Vascular/Lymphatic: The abdominal aorta and IVC unremarkable. No portal venous gas. There is no adenopathy. Reproductive: The uterus is anteverted. No suspicious adnexal masses. Other: None Musculoskeletal: No acute or significant osseous findings. IMPRESSION: 1. No acute intra-abdominal or pelvic pathology. No evidence of recurrent or metastatic disease. 2. Right hemicolectomy with ileocolic anastomosis. No bowel obstruction. The Electronically Signed   By: Vanetta Chou M.D.   On: 03/06/2024 17:19   DG Chest 2 View Result Date: 03/06/2024 CLINICAL DATA:  Questionable sepsis EXAM: CHEST - 2 VIEW COMPARISON:  None Available. FINDINGS: Port in the anterior chest wall with tip in distal SVC. Normal mediastinum and cardiac silhouette. Normal pulmonary vasculature. No evidence of effusion, infiltrate, or pneumothorax. No acute bony abnormality. IMPRESSION: No acute cardiopulmonary process. Electronically Signed   By: Jackquline Boxer M.D.   On: 03/06/2024 15:35    CODE STATUS:  Code Status History     Date Active Date Inactive Code Status Order  ID Comments User Context   01/17/2024 0459 01/23/2024 1727 Full Code [id number]  Alfornia Madison, MD ED    Questions for Most Recent Historical Code Status (Order [id number])     Question Answer   By: Consent: discussion documented in EHR            Orders Placed This Encounter  Procedures   Consent Attestation for Oncology Treatment    The patient is informed of risks, benefits, side-effects of the prescribed oncology treatment. Potential short term and long term side effects and response rates discussed. After a long discussion, the patient made informed decision to proceed.:   Yes   Pregnancy, urine    Standing Status:   Standing    Number of Occurrences:   10    Next Expected Occurrence:   03/26/2024    Expiration Date:   03/23/2025   CBC with Differential (Cancer Center Only)    Standing Status:   Future    Expected Date:   03/30/2024    Expiration Date:   03/30/2025   CMP (Cancer Center only)    Standing Status:   Future    Expected  Date:   03/30/2024    Expiration Date:   03/30/2025   CBC with Differential (Cancer Center Only)    Standing Status:   Future    Expected Date:   04/20/2024    Expiration Date:   04/20/2025   CMP (Cancer Center only)    Standing Status:   Future    Expected Date:   04/20/2024    Expiration Date:   04/20/2025   [employer] PHYSICIAN COMMUNICATION 1    2 Number of doses of oxaliplatin  received at [facility] or outside facility. AP signature of Provider. If patient has received greater than 5 doses of oxaliplatin , the following pre-medications should be ordered: dexamethasone , diphenhydramine , and formulary histamine H2 antagonist. If patient cannot tolerate oral histamine H2 antagonist, IV may be given.     Future Appointments  Date Time Provider Department Center  04/16/2024  9:00 AM Woodard Burnard BROCKS, Counselor [facility] None  04/16/2024 10:15 AM [facility] LAB [facility] None      This document was completed utilizing speech recognition software.  Grammatical errors, random word insertions, pronoun errors, and incomplete sentences are an occasional consequence of this system due to software limitations, ambient noise, and hardware issues. Any formal questions or concerns about the content, text or information contained within the body of this dictation should be directly addressed to the provider for clarification.

## 2024-03-23 NOTE — Assessment & Plan Note (Addendum)
 Please review oncology history for additional details and timeline of events.    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 MMR proficient.   Previously I discussed diagnosis, staging, prognosis, plan of care, treatment options.  Reviewed NCCN guidelines.  On 02/07/2024, staging CT of the chest showed no evidence of intrathoracic metastatic disease.  Started cycle 1 of adjuvant FOLFOX on 02/15/2024.  Initially it seemed as if she has suffered an allergic reaction to oxaliplatin  with tongue muscle twitching, difficulty speaking, and jaw muscle tightness, the day after chemo.  Discussed alternatives.  Plan made to proceed with reduced dose oxaliplatin  at 50% at a slower rate over 3 hours with extra allergy medicines including Benadryl  and Pepcid .   She received cycle 2 of FOLFOX without 5-FU pump and dose reduced oxaliplatin  at a slower rate on 02/27/2024.  On 02/28/2024, she had to present to the ED again with similar complaints of tongue muscle twitching, jaw clenching etc.  Symptoms probably resolved after stopping 5-FU pump in the ED.  It is now clear that she has allergic reaction to 5-FU infusion.  We cannot use it anymore because of the severity of reaction.  Discussed options today.  Discussed alternative option of capecitabine , although definitive and has similar mechanism of action as 5-FU and she may suffer similar side effect profile.  Patient willing to try capecitabine  with oxaliplatin .  If she were to suffer similar side effects like before, then we will have to hold chemotherapy and continue close surveillance with Guardant reveal and CT imaging.  Will plan to do oxaliplatin  at half-strength at a slower rate with each cycle.  Tentative treatment in 1 week.  Discussed side effect profile with capecitabine  today including hand-foot syndrome, diarrhea, nausea, fatigue, cytopenias etc.  She will have formal education with our chemo  pharmacist.  - She was previously referred to genetic counseling for genetic testing.  - Monitor with circulating tumor DNA test and CT scans every three months for two years.  Circulating tumor DNA test was negative on 02/06/2024.  -We will schedule colonoscopy one year post-surgery.  She wants to try to work remotely given the fact that she will be on immunosuppressive chemotherapy.  Provided a letter to reflect the same.

## 2024-03-25 ENCOUNTER — Other Ambulatory Visit: Payer: Self-pay

## 2024-03-26 ENCOUNTER — Telehealth: Payer: Self-pay

## 2024-03-26 ENCOUNTER — Telehealth: Payer: Self-pay | Admitting: Oncology

## 2024-03-26 ENCOUNTER — Other Ambulatory Visit (HOSPITAL_COMMUNITY): Payer: Self-pay

## 2024-03-26 ENCOUNTER — Encounter: Payer: Self-pay | Admitting: Oncology

## 2024-03-26 DIAGNOSIS — C180 Malignant neoplasm of cecum: Secondary | ICD-10-CM

## 2024-03-26 NOTE — Telephone Encounter (Signed)
 I have contacted Sherry Abbott and informed her of her upcoming appointments scheduled for this week. Sherry Abbott has questions about medications that have not been delivered yet. Sherry Abbott stated that she has not received a call about the medication. I informed her that I will let Dr. Autumn know.

## 2024-03-26 NOTE — Telephone Encounter (Signed)
 Clinical Pharmacist Encounter   Received new prescription for Xeloda  (capecitabine ) for the treatment of Adenocarcinoma of cecum in conjunction with oxaliplatin , planned duration until disease progression or unacceptable toxicity.  Patient currently being treated with FOLFOX, but due to reaction of tongue muscle twitching, jaw clenching, when receiving IV 5-FU, provider is switching to the PO formulation as an alternative option. If patient continues to experience similar side effects while on the oral formulation, will plan to discontinue therapy.   Labs from 03/23/2024 assessed, renal function, LFTs and ANC within normal limits. Prescription dose and frequency assessed.  Xeloda  (capecitabine ) 1500 mg (~850 mg/m^2) by mouth twice daily. Take within 30 meals after a meal. Take on days 1 - 14 and then 7 days off.   Addendum:  Provider changed the dose to Capecitabine  1500 mg (3 tablets) by mouth in the morning and 1000 mg (2 tablets) by mouth in the evening. Take within 30 meals after a meal. Take on days 1 - 14 and then 7 days off.   This is in agreement with NCCN guidelines recommending a starting doses of 850 mg/m^2 - 1000 mg/m^2 twice daily when used with oxaliplatin  and substituted for IV 5 FU. Also in agreement with Ducreux et al. Meeting abstract [phone], ASCO 2007 Annual Meeting.    Current medication list in Epic reviewed, 2 DDIs with Xeloda  (capecitabine ) identified: Ondansetron , Category C, may increase QTc prolongation. Patient is only taking medication PRN for nausea and vomiting. Last EKG 03/09/2024 - QTc 437. No concerns at this time due to normal EKG and PRN status of the medication. May consider increased EKG monitoring if patients begins to take on a more frequent basis. Pantoprazole , Category C, may reduce therapeutic effect. Last fill 12/29/2023. To confirm with patient if they are still taking. If patient reports they need acid suppression therapy periodically, will recommend to change  to famotidine  while on capecitabine .   Supportive Care Minimal to Low Risk Emetogenic Potential (< 30%) Patient has active prescriptions of compazine  10 mg every 6 hours as needed and ondansetron  8 mg every 8 hours as needed  Hand and foot syndrome Recommend patient purchase Urea 10-20% cream to keep hands and feet moisturized while on therapy  Or apply 1g diclofenac gel to the hands up to 4 times daily to based on TORCH-D trial for prevent development of hand syndrome   Evaluated chart and no patient barriers to medication adherence identified.   Prescription has been e-scribed to the [facility] for benefits analysis and approval.  Oral Oncology Clinic will continue to follow for insurance authorization, copayment issues, initial counseling and start date.  Thank you for allowing pharmacy to participate in this patient's care.  Alfonso MARLA Buys, PharmD Pharmacy Resident  03/26/2024 8:52 AM

## 2024-03-26 NOTE — Telephone Encounter (Signed)
 Oral Oncology Patient Advocate Encounter  Prior Authorization for Capecitabine  has been approved.    PA# [id number] Effective dates: 03/26/24 through 03/26/25  Patients co-pay is $5.00.    Charlott Hamilton,  CPhT-Adv  she/her/hers [facility]  [facility] Pharmacy Technician Patient Advocate Specialist III [facility] Phone: [phone]  Fax: [phone] [email or web address]

## 2024-03-26 NOTE — Telephone Encounter (Signed)
 Oral Oncology Patient Advocate Encounter   Received notification that prior authorization for Capecitabine  is required.   PA submitted on 03/26/24 Key [id number] Status is pending      Charlott Hamilton,  CPhT-Adv  she/her/hers [facility]  [facility] Specialty Pharmacy Services Pharmacy Technician Patient Advocate Specialist III [facility] Phone: [phone]  Fax: [phone] [email or web address]

## 2024-03-27 ENCOUNTER — Other Ambulatory Visit: Payer: Self-pay

## 2024-03-27 ENCOUNTER — Encounter: Payer: Self-pay | Admitting: Oncology

## 2024-03-27 ENCOUNTER — Other Ambulatory Visit (HOSPITAL_COMMUNITY): Payer: Self-pay

## 2024-03-27 MED ORDER — CAPECITABINE 500 MG PO TABS
ORAL_TABLET | ORAL | 7 refills | Status: DC
  Filled 2024-03-27: qty 70, 21d supply, fill #0
  Filled 2024-04-13: qty 70, 21d supply, fill #1
  Filled 2024-05-04: qty 70, 21d supply, fill #2
  Filled 2024-05-23: qty 70, 21d supply, fill #3
  Filled 2024-06-11: qty 70, 21d supply, fill #4
  Filled 2024-06-29: qty 70, 21d supply, fill #5
  Filled 2024-07-23: qty 70, 21d supply, fill #6

## 2024-03-27 NOTE — Addendum Note (Signed)
 Addended by: Beckett Hickmon K on: 03/27/2024 03:53 PM   Modules accepted: Orders

## 2024-03-27 NOTE — Progress Notes (Signed)
 Patient counseled by our PGY2 oncology pharmacy resident, Alfonso Buys, on Xeloda  through telephone encounter opened on 03/26/2024.   Sheritha Louis, PharmD Hematology/Oncology Clinical Pharmacist [facility] [phone]

## 2024-03-27 NOTE — Telephone Encounter (Signed)
 Clinical Pharmacist Encounter   Patient Education I spoke with patient for overview of new oral chemotherapy medication: Xeloda  (capecitabine ) for the treatment of Adenocarcinoma of cecum in conjunction with oxaliplatin , planned duration until disease progression or unacceptable toxicity.   Counseled patient on administration, dosing, side effects, monitoring, drug-food interactions, safe handling, storage, and disposal.  Patient will take Xeloda  (capecitabine ) 1500 mg (3 tablets) by mouth in the morning and then 1000 mg (2 tablets) by mouth in the evening. Take within 30 minutes after a meal. Take for 14 days on and then 7 days off. Repeat every 21 days.   Start date: March 29, 2024 in the morning Patient plans to pick up medication at [facility] tomorrow 03/28/2024 Treatment goal: Curative  Previous Reaction Counseled the patient that if they start to feel like they are having a repeat reaction to call the Cancer Center, if it's not an emergency. If they they are having emergency symptoms such as experience SOB, trouble breathing, then they need to seek medical care immediately.  They have an appointment to see their provider Thursday (03/29/2024) morning.   Side effects include but not limited to:  Decreased cell counts: counseled them to monitor for fevers. Advised them to call the office if they experience a fever > 100.56F. They may also feel tired while on this medication, but they should be able to participate in normal daily activities. Additionally, they might experience increased bruising while on this medication.  Diarrhea: Counseled that if they experience diarrhea while on this medication they can take over the counter Imodium. If they experience over 4 loose stools above their usual per day, they should to call the provider's office for additional medications/treatment.  Mouth irritation: Counseled that if they experience mouth sores or irritation they can treat  with over the counter non-alcoholic mouth wash. If this doesn't help, then they can call their provider for a prescription of Magic Mouth Wash. Skin rash: Counseled patient that they may experience dryness, rash, tingles on their hands and feet during treatment. They can use Udderly smooth, 10-20% urea cream. Patient reported that they are using lotion currently that is working well. Informed them that if they needed, they can also use diclofenac gel twice daily on their hands to reduce the risk of hand/foot syndrome. Patient asked about using heating pads since they get cold during treatment. Informed them that this could dry out their skin with prolonged use; however, if they find their skin can tolerate short-term use during chemo treatments then it is ok. Patient expressed understanding to monitor if using their heating pads increases their skin sensitivity. Informed patient to let their provider know if they develop a rash the doesn't get better with the creams or lotions.  Nausea vomiting: patient reported they still have ondansetron  and compazine  at home that they can use if they start of experience nausea and vomiting.   DDI Patient reported that they are not taking pantoprazole  regularly at the moment for GERD. Advised the patient since PPIs can reduce efficacy of capecitabine  they can take famotidine  instead if needed.  Patient expressed understanding that this oral medication is similar to the one she was just taking and had a reaction to.  Per provider note: Discussed alternative option of capecitabine , although definitive and has similar mechanism of action as 5-FU and she may suffer similar side effect profile. Patient willing to try capecitabine  with oxaliplatin .  Reviewed with patient importance of keeping a medication schedule and plan for  any missed doses.  After discussion with patient no patient barriers to medication adherence identified.   Patient voiced understanding and  appreciation. All questions answered. Medication handout provided.  Distress thermometer not completed during telephone call as patient has been on previous lines of therapy.   Communication and Learning Assessment Primary learner: Patient Barriers to learning: No barriers Preferred language: English Learning preferences: Listening Reading  Provided patient with Oral Chemotherapy Navigation Clinic phone number. Patient knows to call the office with questions or concerns. Oral Chemotherapy Navigation Clinic will continue to follow.  Sherry Abbott, PharmD Pharmacy Resident  03/27/2024 2:40 PM

## 2024-03-27 NOTE — Progress Notes (Signed)
 Specialty Pharmacy Initial Fill Coordination Note  Sherry Abbott is a 48 y.o. female contacted today regarding refills of specialty medication(s) Capecitabine  (XELODA ) .  Patient requested [name] at [facility] [place]  on 03/28/24   Medication will be filled on 03/27/24.   Patient is aware of $5.00 copayment.

## 2024-03-28 ENCOUNTER — Other Ambulatory Visit (HOSPITAL_COMMUNITY): Payer: Self-pay

## 2024-03-28 ENCOUNTER — Other Ambulatory Visit: Payer: Self-pay

## 2024-03-29 ENCOUNTER — Other Ambulatory Visit: Payer: Self-pay

## 2024-03-29 ENCOUNTER — Ambulatory Visit (HOSPITAL_BASED_OUTPATIENT_CLINIC_OR_DEPARTMENT_OTHER): Admitting: Oncology

## 2024-03-29 ENCOUNTER — Inpatient Hospital Stay

## 2024-03-29 ENCOUNTER — Encounter: Payer: Self-pay | Admitting: Oncology

## 2024-03-29 VITALS — BP 107/71 | HR 77 | Temp 97.9°F | Resp 17 | Ht 65.0 in | Wt 156.2 lb

## 2024-03-29 DIAGNOSIS — C180 Malignant neoplasm of cecum: Secondary | ICD-10-CM | POA: Diagnosis not present

## 2024-03-29 LAB — CBC WITH DIFFERENTIAL (CANCER CENTER ONLY)
Abs Immature Granulocytes: 0.01 K/uL (ref 0.00–0.07)
Basophils Absolute: 0 K/uL (ref 0.0–0.1)
Basophils Relative: 0 %
Eosinophils Absolute: 0.1 K/uL (ref 0.0–0.5)
Eosinophils Relative: 3 %
HCT: 29.9 % — ABNORMAL LOW (ref 36.0–46.0)
Hemoglobin: 10.1 g/dL — ABNORMAL LOW (ref 12.0–15.0)
Immature Granulocytes: 0 %
Lymphocytes Relative: 25 %
Lymphs Abs: 1.2 K/uL (ref 0.7–4.0)
MCH: 27.7 pg (ref 26.0–34.0)
MCHC: 33.8 g/dL (ref 30.0–36.0)
MCV: 81.9 fL (ref 80.0–100.0)
Monocytes Absolute: 0.4 K/uL (ref 0.1–1.0)
Monocytes Relative: 8 %
Neutro Abs: 3.1 K/uL (ref 1.7–7.7)
Neutrophils Relative %: 64 %
Platelet Count: 222 K/uL (ref 150–400)
RBC: 3.65 MIL/uL — ABNORMAL LOW (ref 3.87–5.11)
RDW: 16.9 % — ABNORMAL HIGH (ref 11.5–15.5)
WBC Count: 4.9 K/uL (ref 4.0–10.5)
nRBC: 0 % (ref 0.0–0.2)

## 2024-03-29 LAB — CMP (CANCER CENTER ONLY)
ALT: 19 U/L (ref 0–44)
AST: 21 U/L (ref 15–41)
Albumin: 4 g/dL (ref 3.5–5.0)
Alkaline Phosphatase: 40 U/L (ref 38–126)
Anion gap: 3 — ABNORMAL LOW (ref 5–15)
BUN: 10 mg/dL (ref 6–20)
CO2: 30 mmol/L (ref 22–32)
Calcium: 8.8 mg/dL — ABNORMAL LOW (ref 8.9–10.3)
Chloride: 105 mmol/L (ref 98–111)
Creatinine: 0.67 mg/dL (ref 0.44–1.00)
GFR, Estimated: >60 mL/min (ref >60)
Glucose, Bld: 87 mg/dL (ref 70–99)
Potassium: 3.9 mmol/L (ref 3.5–5.1)
Sodium: 138 mmol/L (ref 135–145)
Total Bilirubin: 0.5 mg/dL (ref 0.0–1.2)
Total Protein: 6.8 g/dL (ref 6.5–8.1)

## 2024-03-29 LAB — PREGNANCY, URINE: Preg Test, Ur: NEGATIVE

## 2024-03-29 NOTE — Assessment & Plan Note (Addendum)
 Please review oncology history for additional details and timeline of events.    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 MMR proficient.   Previously I discussed diagnosis, staging, prognosis, plan of care, treatment options.  Reviewed NCCN guidelines.  On 02/07/2024, staging CT of the chest showed no evidence of intrathoracic metastatic disease.  Started cycle 1 of adjuvant FOLFOX on 02/15/2024.  Initially it seemed as if she has suffered an allergic reaction to oxaliplatin  with tongue muscle twitching, difficulty speaking, and jaw muscle tightness, the day after chemo.  Discussed alternatives.  Plan made to proceed with reduced dose oxaliplatin  at 50% at a slower rate over 3 hours with extra allergy medicines including Benadryl  and Pepcid .   She received cycle 2 of FOLFOX without 5-FU pump and dose reduced oxaliplatin  at a slower rate on 02/27/2024.  On 02/28/2024, she had to present to the ED again with similar complaints of tongue muscle twitching, jaw clenching etc.  Symptoms probably resolved after stopping 5-FU pump in the ED.  It is now clear that she has allergic reaction to 5-FU infusion.  We cannot use it anymore because of the severity of reaction.  Discussed alternative option of capecitabine , although definitive and has similar mechanism of action as 5-FU and she may suffer similar side effect profile.  Patient willing to try capecitabine  with oxaliplatin .  Discussed side effect profile with capecitabine  including hand-foot syndrome, diarrhea, nausea, fatigue, cytopenias etc.   If she were to suffer similar side effects like before, then we will have to hold chemotherapy and continue close surveillance with Guardant reveal and CT imaging.  Patient was agreeable to proceeding with capecitabine  and she started that earlier today.  Dose was reduced to avoid toxicity/risk of reaction.  Calculated dose is 1500 mg p.o. every  morning and 1000 mg p.o. nightly for 2 weeks on, 1 week off.  Will plan to do oxaliplatin  at half-strength at a slower rate with each cycle.  She is scheduled to start this from tomorrow, 03/30/2024.  Labs reveal no dose-limiting toxicities.  Will proceed with oxaliplatin  as scheduled tomorrow.  - She was previously referred to genetic counseling for genetic testing.  - Monitor with circulating tumor DNA test and CT scans every three months for two years.  Circulating tumor DNA test was negative on 02/06/2024.  -We will schedule colonoscopy one year post-surgery.  She wants to try to work remotely given the fact that she will be on immunosuppressive chemotherapy.  Provided a letter to reflect the same.  I will plan to see her approximately in 1 week for toxicity evaluation.

## 2024-03-29 NOTE — Progress Notes (Signed)
 Patient seen by Dr. Archie Patten Pasam today  Vitals are within treatment parameters:Yes   Labs are within treatment parameters: Yes   Treatment plan has been signed: Yes   Per physician team, Patient is ready for treatment and there are NO modifications to the treatment plan.

## 2024-03-29 NOTE — Progress Notes (Signed)
 [facility]  ONCOLOGY CLINIC PROGRESS NOTE   Patient Care Team: Sun, Vyvyan, MD as PCP - General (Family Medicine) Dasie Leonor CROME, MD as Consulting Physician (General Surgery) Autumn Millman, MD as Consulting Physician (Oncology) Mai Lynwood FALCON, MD as Consulting Physician (Rheumatology)  PATIENT NAME: Sherry Abbott   MR#: [id number] DOB: 03/02/1976  Date of visit: 03/29/2024   ASSESSMENT & PLAN:   Katrisha Denise Andy is a 48 y.o. 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  pT3, pN1a, cM0, stage III B disease.  MMR proficient.  Side effects to 5-FU infusion with jaw clenching, muscle spasms of the tongue and generalized muscle twitching.  Adenocarcinoma of cecum [facility]) Please review oncology history for additional details and timeline of events.    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 MMR proficient.   Previously I discussed diagnosis, staging, prognosis, plan of care, treatment options.  Reviewed NCCN guidelines.  On 02/07/2024, staging CT of the chest showed no evidence of intrathoracic metastatic disease.  Started cycle 1 of adjuvant FOLFOX on 02/15/2024.  Initially it seemed as if she has suffered an allergic reaction to oxaliplatin  with tongue muscle twitching, difficulty speaking, and jaw muscle tightness, the day after chemo.  Discussed alternatives.  Plan made to proceed with reduced dose oxaliplatin  at 50% at a slower rate over 3 hours with extra allergy medicines including Benadryl  and Pepcid .   She received cycle 2 of FOLFOX without 5-FU pump and dose reduced oxaliplatin  at a slower rate on 02/27/2024.  On 02/28/2024, she had to present to the ED again  with similar complaints of tongue muscle twitching, jaw clenching etc.  Symptoms probably resolved after stopping 5-FU pump in the ED.  It is now clear that she has allergic reaction to 5-FU infusion.  We cannot use it anymore because of the severity of reaction.  Discussed alternative option of capecitabine , although definitive and has similar mechanism of action as 5-FU and she may suffer similar side effect profile.  Patient willing to try capecitabine  with oxaliplatin .  Discussed side effect profile with capecitabine  including hand-foot syndrome, diarrhea, nausea, fatigue, cytopenias etc.   If she were to suffer similar side effects like before, then we will have to hold chemotherapy and continue close surveillance with Guardant reveal and CT imaging.  Patient was agreeable to proceeding with capecitabine  and she started that earlier today.  Dose was reduced to avoid toxicity/risk of reaction.  Calculated dose is 1500 mg p.o. every morning and 1000 mg p.o. nightly for 2 weeks on, 1 week off.  Will plan to do oxaliplatin  at half-strength at a slower rate with each cycle.  She is scheduled to start this from tomorrow, 03/30/2024.   - She was previously referred to genetic counseling for genetic testing.  - Monitor with circulating tumor DNA test and CT scans every three months for two years.  Circulating tumor DNA test was negative on 02/06/2024.  -We will schedule colonoscopy one year post-surgery.  She wants to try to work remotely given the fact that she will be on immunosuppressive chemotherapy.  Provided a letter to reflect the same.  I will plan to see her approximately in 1 week for toxicity evaluation.   Allergic reaction to fluorouracil  (  5-FU) Severe allergic reaction to 5-FU characterized by slurred speech, difficulty forming words, jaw clenching, and tongue rolling, occurring 24 hours post-infusion and more severe upon re-exposure. Capecitabine  is proposed as an alternative due to  its different side effect profile. - Discontinue 5-FU due to severe allergic reaction. - Monitor for any allergic reactions to capecitabine .  I reviewed lab results and outside records for this visit and discussed relevant results with the patient. Diagnosis, plan of care and treatment options were also discussed in detail with the patient. Opportunity provided to ask questions and answers provided to her apparent satisfaction. Provided instructions to call our clinic with any problems, questions or concerns prior to return visit. I recommended to continue follow-up with PCP and sub-specialists. She verbalized understanding and agreed with the plan.   NCCN guidelines have been consulted in the planning of this patient's care.  I spent a total of 30 minutes during this encounter with the patient including review of chart and various tests results, discussions about plan of care and coordination of care plan.   Chinita Patten, MD  03/29/2024 12:51 PM  [facility] - [facility] [address] [place] [address] Dept: [phone] Dept Fax: [phone]    CHIEF COMPLAINT/ REASON FOR VISIT:   Invasive adenocarcinoma of the cecum, status post hemicolectomy on 01/19/2024.  pT3, pN1a, cM0, stage III B disease.  MMR proficient.   Current Treatment: Adjuvant systemic chemotherapy with FOLFOX started from 02/15/2024.  INTERVAL HISTORY:    Discussed the use of AI scribe software for clinical note transcription with the patient, who gave verbal consent to proceed.  History of Present Illness  She started cycle 1 of FOLFOX on 02/15/2024.  On 02/16/2024, she presented to the ED with tongue muscle twitching, difficulty speaking, and jaw muscle tightness.  5-FU pump was stopped in the ED and she was given extra allergy medicines including Solu-Medrol , Pepcid  and Benadryl  as per our direction and symptoms resolved promptly.  She  experienced sudden onset of muscle twitching, difficulty speaking, and jaw muscle tightness while in the kitchen helping her husband make lasagna. Her mouth clenched, and her tongue curled up like a 'U', making it difficult to open her mouth. Her speech progressively worsened to the point where she could not speak, and her jaw started moving to the side, resembling symptoms of a stroke.  She has been experiencing adverse reactions to her chemotherapy regimen, specifically with the use of fluorouracil  (5-FU) administered via a pump. After starting the pump, she developed slurred speech, difficulty forming words, jaw clenching, and tongue rolling, which occurred over approximately 20 minutes during the first episode. The second episode was more severe and had a rapid onset, occurring about 24 hours after the pump was started.  She started taking capecitabine  this morning with a reduced dosage of three pills in the morning and two at night. No issues have been reported so far, as it has only been a few hours since starting the medication.  She is scheduled for oxaliplatin  treatment tomorrow. She experienced mild nausea yesterday, which she attributes to going too long between meals. She has been eating a lot of pretzels and bread recently and acknowledges that her diet has not been ideal over the past few days. No diarrhea or other gastrointestinal issues are present. She is using urea cream as a moisturizer on her hands and feet.  She was informed by the  pharmacy not to take Protonix  while on her current medication regimen as it may reduce the effectiveness of her treatment.  I have reviewed the past medical history, past surgical history, social history and family history with the patient and they are unchanged from previous note.  HISTORY OF PRESENT ILLNESS:   ONCOLOGY HISTORY:   Patient had routine screening colonoscopy on 01/12/2024, performed by Dr. Saintclair.  It showed a fungating, infiltrative,  sessile, polypoid and ulcerated nonobstructing large mass in the cecum and in the ascending colon.  The mass was partially circumferential involving two thirds of the lumen circumference, measuring at least 7 cm in length.  Biopsies were obtained.  3 sessile polyps were found in the transverse colon, descending colon and sigmoid colon measuring up to 6 mm, removed.  Pathology from the cecal mass came back positive for invasive moderately differentiated adenocarcinoma.  MMR proficient.   On 01/16/2024, CT abdomen and pelvis showed 2.8 cm cyst in the left hepatic lobe, simple appearing.  No pathologic lymphadenopathy or other acute findings were noted.   She was referred for surgical oncology evaluation.  She was supposed to see Dr. Ann on 01/17/2024 but presented to the ED that same day with complaints of worsening right lower quadrant abdominal pain, nausea, vomiting and inability to tolerate oral intake.  She was admitted to the hospital for further evaluation and management.   She was seen by Dr. Dasie in the hospital and she underwent laparoscopic-assisted right hemicolectomy, excision of peritoneal cyst on 01/19/2024.  Final pathology showed invasive moderately differentiated adenocarcinoma with focal mucinous differentiation arising within a tubulovillous adenoma, measuring 6.7 cm in greatest dimension with involvement of submucosa, muscularis propria and into pericolonic adipose tissue.  1 out of 26 lymph nodes were positive for malignancy.  There was another single positive tumor deposit.  All margins were negative.  Grade 2.  No evidence of LVI or PNI.  MMR proficient.     On her consultation with us  on 01/25/2024, request placed for CT of the chest for staging.  On 02/07/2024, staging CT of the chest showed no evidence of intrathoracic metastatic disease.  pT3,pN1a, cM0, Stage III B disease.    Plan made for adjuvant chemotherapy with modified FOLFOX-6, every 2 weeks for up to 12 cycles.  Started  cycle 1 on 02/15/2024.  Initially it seemed as if she has suffered an allergic reaction to oxaliplatin  with tongue muscle twitching, difficulty speaking, and jaw muscle tightness, the day after chemo.  Discussed alternatives.  Plan made to proceed with reduced dose oxaliplatin  at 50% at a slower rate over 3 hours with extra allergy medicines including Benadryl  and Pepcid .   She received cycle 2 of FOLFOX without 5-FU pump and dose reduced oxaliplatin  at a slower rate on 02/27/2024.  On 02/28/2024, she had to present to the ED again with similar complaints of tongue muscle twitching, jaw clenching etc.  Symptoms probably resolved after stopping 5-FU pump in the ED.  It is now clear that she has allergic reaction to 5-FU infusion.  We cannot use it anymore because of the severity of reaction.  Patient willing to try capecitabine  with oxaliplatin .  If she were to suffer similar side effects, then we will have to hold chemotherapy and continue close surveillance with Guardant reveal and CT imaging.  Started Capecitabine  from 03/29/2024. Oxaliplatin  scheduled from 03/30/24.    Oncology History  Adenocarcinoma of cecum (HCC)  01/17/2024 Initial Diagnosis   Adenocarcinoma of cecum (HCC)   01/25/2024  Cancer Staging   Staging form: Colon and Rectum, AJCC 8th Edition - Pathologic: Stage IIIB (pT3, pN1a, cM0) - Signed by Autumn Millman, MD on 02/15/2024 Total positive nodes: 1 Histologic grading system: 4 grade system Histologic grade (G): G2 Residual tumor (R): R0   02/15/2024 - 02/29/2024 Chemotherapy   Patient is on Treatment Plan : COLORECTAL FOLFOX q14d x 3 months     03/30/2024 -  Chemotherapy   Patient is on Treatment Plan : COLORECTAL Xelox (Capeox)(130/850) q21d         REVIEW OF SYSTEMS:   Review of Systems - Oncology  All other pertinent systems were reviewed with the patient and are negative.  ALLERGIES: She is allergic to betadine [povidone iodine], blueberry [vaccinium angustifolium],  milk-related compounds, neosporin [neomycin-bacitracin zn-polymyx], povidone-iodine, shellfish allergy, sulfa antibiotics, neomycin, and other.  MEDICATIONS:  Current Outpatient Medications  Medication Sig Dispense Refill   B Complex Vitamins (VITAMIN B-COMPLEX) TABS Take 1 tablet by mouth daily with supper.     capecitabine  (XELODA ) 500 MG tablet Take 3 tablets (1500 mg) by mouth in the morning and 2 tablets (1000 mg) by mouth in the evening for 14 days on, followed by 7 days off. Repeat every 21 days.Take within 30 minutes after meals. 70 tablet 7   cetirizine  (ZYRTEC  ALLERGY) 10 MG tablet Take 1 tablet (10 mg total) by mouth at bedtime. 90 tablet 0   COLACE 100 MG capsule Take 200 mg by mouth in the morning and at bedtime.     dexamethasone  (DECADRON ) 4 MG tablet Take 2 tablets (8 mg total) by mouth daily. Start the day after chemotherapy for 2 days. Take with food. 30 tablet 1   diphenhydrAMINE  (BENADRYL ) 25 MG tablet Take 1 tablet (25 mg total) by mouth every 6 (six) hours as needed. (Patient taking differently: Take 25 mg by mouth every 6 (six) hours as needed for allergies or itching.) 30 tablet 0   famotidine  (PEPCID ) 20 MG tablet Take 1 tablet (20 mg total) by mouth 2 (two) times daily. (Patient taking differently: Take 20 mg by mouth 2 (two) times daily as needed for heartburn or indigestion.) 30 tablet 0   fluticasone  (FLONASE ) 50 MCG/ACT nasal spray Place 2 sprays into both nostrils daily. 9.9 mL 0   folic acid  (FOLVITE ) 1 MG tablet Take 1 mg by mouth daily.     lidocaine -prilocaine  (EMLA ) cream Apply to affected area once 30 g 3   mometasone (NASONEX) 50 MCG/ACT nasal spray Place 2 sprays into the nose daily as needed (Allergies).     montelukast  (SINGULAIR ) 10 MG tablet Take 10 mg by mouth at bedtime.     Multiple Vitamin (MULTIVITAMIN WITH MINERALS) TABS tablet Take 1 tablet by mouth daily.     ondansetron  (ZOFRAN ) 8 MG tablet Take 1 tablet (8 mg total) by mouth every 8 (eight) hours  as needed for nausea or vomiting. Start on the third day after chemotherapy. 30 tablet 1   oxyCODONE  (OXY IR/ROXICODONE ) 5 MG immediate release tablet Take 1 tablet (5 mg total) by mouth every 8 (eight) hours as needed for severe pain (pain score 7-10) (5 mg for pain score 4-6, 10 mg for pain score 7-10). 12 tablet 0   pantoprazole  (PROTONIX ) 20 MG tablet Take 20 mg by mouth 2 (two) times daily. (Patient taking differently: Take 20 mg by mouth 2 (two) times daily as needed for heartburn or indigestion.)     polyethylene glycol powder (GLYCOLAX /MIRALAX ) 17 GM/SCOOP powder Take 17 g  by mouth daily as needed. (Patient taking differently: Take 17 g by mouth daily.) 238 g 0   prochlorperazine  (COMPAZINE ) 10 MG tablet Take 1 tablet (10 mg total) by mouth every 6 (six) hours as needed for nausea or vomiting. 30 tablet 1   TYLENOL  500 MG tablet Take 500-1,000 mg by mouth every 6 (six) hours as needed for mild pain (pain score 1-3), headache or fever.     Current Facility-Administered Medications  Medication Dose Route Frequency Provider Last Rate Last Admin   Fremanezumab -vfrm SOSY 225 mg  225 mg Subcutaneous Once Ines Onetha NOVAK, MD         VITALS:   Blood pressure 107/71, pulse 77, temperature 97.9 F (36.6 C), temperature source Temporal, resp. rate 17, height 5' 5 (1.651 m), weight 156 lb 3 oz (70.8 kg), SpO2 100%.  Wt Readings from Last 3 Encounters:  03/29/24 156 lb 3 oz (70.8 kg)  03/23/24 152 lb 14.4 oz (69.4 kg)  03/13/24 151 lb 11.2 oz (68.8 kg)    Body mass index is 25.99 kg/m.    Onc Performance Status - 03/29/24 1000       ECOG Perf Status   ECOG Perf Status Restricted in physically strenuous activity but ambulatory and able to carry out work of a light or sedentary nature, e.g., light house work, office work      KPS SCALE   KPS % SCORE Normal activity with effort, some s/s of disease            PHYSICAL EXAM:   Physical Exam Constitutional:      General: She is  not in acute distress.    Appearance: Normal appearance.  HENT:     Head: Normocephalic and atraumatic.  Cardiovascular:     Rate and Rhythm: Normal rate.  Pulmonary:     Effort: Pulmonary effort is normal. No respiratory distress.  Chest:     Comments: Right-sided Port-A-Cath in place without any signs of infection Abdominal:     General: There is no distension.  Neurological:     General: No focal deficit present.     Mental Status: She is alert and oriented to person, place, and time.  Psychiatric:        Mood and Affect: Mood normal.        Behavior: Behavior normal.       LABORATORY DATA:   I have reviewed the data as listed.  Results for orders placed or performed in visit on 03/29/24  Pregnancy, urine  Result Value Ref Range   Preg Test, Ur NEGATIVE NEGATIVE  CBC with Differential (Cancer Center Only)  Result Value Ref Range   WBC Count 4.9 4.0 - 10.5 K/uL   RBC 3.65 (L) 3.87 - 5.11 MIL/uL   Hemoglobin 10.1 (L) 12.0 - 15.0 g/dL   HCT 70.0 (L) 63.9 - 53.9 %   MCV 81.9 80.0 - 100.0 fL   MCH 27.7 26.0 - 34.0 pg   MCHC 33.8 30.0 - 36.0 g/dL   RDW 83.0 (H) 88.4 - 84.4 %   Platelet Count 222 150 - 400 K/uL   nRBC 0.0 0.0 - 0.2 %   Neutrophils Relative % 64 %   Neutro Abs 3.1 1.7 - 7.7 K/uL   Lymphocytes Relative 25 %   Lymphs Abs 1.2 0.7 - 4.0 K/uL   Monocytes Relative 8 %   Monocytes Absolute 0.4 0.1 - 1.0 K/uL   Eosinophils Relative 3 %   Eosinophils Absolute 0.1 0.0 -  0.5 K/uL   Basophils Relative 0 %   Basophils Absolute 0.0 0.0 - 0.1 K/uL   Immature Granulocytes 0 %   Abs Immature Granulocytes 0.01 0.00 - 0.07 K/uL  CMP (Cancer Center only)  Result Value Ref Range   Sodium 138 135 - 145 mmol/L   Potassium 3.9 3.5 - 5.1 mmol/L   Chloride 105 98 - 111 mmol/L   CO2 30 22 - 32 mmol/L   Glucose, Bld 87 70 - 99 mg/dL   BUN 10 6 - 20 mg/dL   Creatinine 9.32 9.55 - 1.00 mg/dL   Calcium  8.8 (L) 8.9 - 10.3 mg/dL   Total Protein 6.8 6.5 - 8.1 g/dL   Albumin   4.0 3.5 - 5.0 g/dL   AST 21 15 - 41 U/L   ALT 19 0 - 44 U/L   Alkaline Phosphatase 40 38 - 126 U/L   Total Bilirubin 0.5 0.0 - 1.2 mg/dL   GFR, Estimated >39 >39 mL/min   Anion gap 3 (L) 5 - 15       RADIOGRAPHIC STUDIES:  I have personally reviewed the radiological images as listed and agree with the findings in the report.  CT ABDOMEN PELVIS W CONTRAST Result Date: 03/06/2024 CLINICAL DATA:  History of colorectal cancer status post resection. EXAM: CT ABDOMEN AND PELVIS WITH CONTRAST TECHNIQUE: Multidetector CT imaging of the abdomen and pelvis was performed using the standard protocol following bolus administration of intravenous contrast. RADIATION DOSE REDUCTION: This exam was performed according to the departmental dose-optimization program which includes automated exposure control, adjustment of the mA and/or kV according to patient size and/or use of iterative reconstruction technique. CONTRAST:  OMNIPAQUE  IOHEXOL  300 MG/ML  SOLN COMPARISON:  CT abdomen pelvis dated 01/16/2024. FINDINGS: Lower chest: The visualized lung bases are clear. No intra-abdominal free air or free fluid. Hepatobiliary: The liver is unremarkable. The gallbladder is unremarkable. No biliary dilatation. Pancreas: Unremarkable. No pancreatic ductal dilatation or surrounding inflammatory changes. Spleen: Normal in size without focal abnormality. Adrenals/Urinary Tract: The adrenal glands unremarkable the kidneys, visualized ureters, and urinary bladder appear unremarkable. Stomach/Bowel: Right hemicolectomy with ileocolic anastomosis. There is no bowel obstruction or active inflammation. Vascular/Lymphatic: The abdominal aorta and IVC unremarkable. No portal venous gas. There is no adenopathy. Reproductive: The uterus is anteverted. No suspicious adnexal masses. Other: None Musculoskeletal: No acute or significant osseous findings. IMPRESSION: 1. No acute intra-abdominal or pelvic pathology. No evidence of recurrent  or metastatic disease. 2. Right hemicolectomy with ileocolic anastomosis. No bowel obstruction. The Electronically Signed   By: Vanetta Chou M.D.   On: 03/06/2024 17:19   DG Chest 2 View Result Date: 03/06/2024 CLINICAL DATA:  Questionable sepsis EXAM: CHEST - 2 VIEW COMPARISON:  None Available. FINDINGS: Port in the anterior chest wall with tip in distal SVC. Normal mediastinum and cardiac silhouette. Normal pulmonary vasculature. No evidence of effusion, infiltrate, or pneumothorax. No acute bony abnormality. IMPRESSION: No acute cardiopulmonary process. Electronically Signed   By: Jackquline Boxer M.D.   On: 03/06/2024 15:35    CODE STATUS:  Code Status History     Date Active Date Inactive Code Status Order ID Comments User Context   01/17/2024 0459 01/23/2024 1727 Full Code [id number]  Alfornia Madison, MD ED    Questions for Most Recent Historical Code Status (Order [id number])     Question Answer   By: Consent: discussion documented in EHR            No orders of the defined  types were placed in this encounter.    Future Appointments  Date Time Provider Department Center  03/30/2024 12:00 PM [facility] INFUSION [facility] None  04/06/2024  2:45 PM [facility] FLUSH [facility] None  04/06/2024  3:15 PM Buford Gayler, MD [facility] None  04/16/2024  9:00 AM Woodard Burnard BROCKS, Counselor [facility] None  04/16/2024 10:30 AM [facility] FLUSH [facility] None  04/20/2024 10:00 AM [facility] FLUSH [facility] None  04/20/2024 10:30 AM Rheda Kassab, MD [facility] None  04/20/2024 11:00 AM [facility] INFUSION [facility] None      This document was completed utilizing speech recognition software. Grammatical errors, random word insertions, pronoun errors, and incomplete sentences are an occasional consequence of this system due to software limitations, ambient noise, and hardware issues. Any formal questions or concerns about the content, text or information contained within  the body of this dictation should be directly addressed to the provider for clarification.

## 2024-03-30 ENCOUNTER — Other Ambulatory Visit: Payer: Self-pay | Admitting: Oncology

## 2024-03-30 ENCOUNTER — Ambulatory Visit

## 2024-03-30 VITALS — BP 110/67 | HR 70 | Temp 97.9°F | Resp 16

## 2024-03-30 DIAGNOSIS — C180 Malignant neoplasm of cecum: Secondary | ICD-10-CM | POA: Diagnosis not present

## 2024-03-30 MED ORDER — DEXAMETHASONE SODIUM PHOSPHATE 10 MG/ML IJ SOLN
10.0000 mg | Freq: Once | INTRAMUSCULAR | Status: AC
  Administered 2024-03-30: 10 mg via INTRAVENOUS
  Filled 2024-03-30: qty 1

## 2024-03-30 MED ORDER — FAMOTIDINE IN NACL 20-0.9 MG/50ML-% IV SOLN
20.0000 mg | Freq: Once | INTRAVENOUS | Status: AC
  Administered 2024-03-30: 20 mg via INTRAVENOUS
  Filled 2024-03-30: qty 50

## 2024-03-30 MED ORDER — PALONOSETRON HCL INJECTION 0.25 MG/5ML
0.2500 mg | Freq: Once | INTRAVENOUS | Status: AC
  Administered 2024-03-30: 0.25 mg via INTRAVENOUS
  Filled 2024-03-30: qty 5

## 2024-03-30 MED ORDER — DIPHENHYDRAMINE HCL 50 MG/ML IJ SOLN
25.0000 mg | Freq: Once | INTRAMUSCULAR | Status: AC
  Administered 2024-03-30: 25 mg via INTRAVENOUS
  Filled 2024-03-30: qty 1

## 2024-03-30 MED ORDER — OXALIPLATIN CHEMO INJECTION 100 MG/20ML
65.0000 mg/m2 | Freq: Once | INTRAVENOUS | Status: AC
  Administered 2024-03-30: 115 mg via INTRAVENOUS
  Filled 2024-03-30: qty 3

## 2024-03-30 MED ORDER — SODIUM CHLORIDE 0.9% FLUSH
10.0000 mL | INTRAVENOUS | Status: DC | PRN
Start: 2024-03-30 — End: 2024-03-30
  Administered 2024-03-30: 10 mL

## 2024-03-30 MED ORDER — DEXTROSE 5 % IV SOLN: INTRAVENOUS | Status: DC

## 2024-03-30 NOTE — Patient Instructions (Signed)
 [facility] - A DEPT OF [facility]  Discharge Instructions: Thank you for choosing [facility] to provide your oncology and hematology care.   If you have a lab appointment with the Cancer Center, please go directly to the Cancer Center and check in at the registration area.   Wear comfortable clothing and clothing appropriate for easy access to any Portacath or PICC line.   We strive to give you quality time with your provider. You may need to reschedule your appointment if you arrive late (15 or more minutes).  Arriving late affects you and other patients whose appointments are after yours.  Also, if you miss three or more appointments without notifying the office, you may be dismissed from the clinic at the provider's discretion.      For prescription refill requests, have your pharmacy contact our office and allow 72 hours for refills to be completed.    Today you received the following chemotherapy and/or immunotherapy agents: oxaliplatin      To help prevent nausea and vomiting after your treatment, we encourage you to take your nausea medication as directed.  BELOW ARE SYMPTOMS THAT SHOULD BE REPORTED IMMEDIATELY: *FEVER GREATER THAN 100.4 F (38 C) OR HIGHER *CHILLS OR SWEATING *NAUSEA AND VOMITING THAT IS NOT CONTROLLED WITH YOUR NAUSEA MEDICATION *UNUSUAL SHORTNESS OF BREATH *UNUSUAL BRUISING OR BLEEDING *URINARY PROBLEMS (pain or burning when urinating, or frequent urination) *BOWEL PROBLEMS (unusual diarrhea, constipation, pain near the anus) TENDERNESS IN MOUTH AND THROAT WITH OR WITHOUT PRESENCE OF ULCERS (sore throat, sores in mouth, or a toothache) UNUSUAL RASH, SWELLING OR PAIN  UNUSUAL VAGINAL DISCHARGE OR ITCHING   Items with * indicate a potential emergency and should be followed up as soon as possible or go to the Emergency Department if any problems should occur.  Please show the CHEMOTHERAPY ALERT CARD or  IMMUNOTHERAPY ALERT CARD at check-in to the Emergency Department and triage nurse.  Should you have questions after your visit or need to cancel or reschedule your appointment, please contact [facility] - A DEPT OF [facility]  Dept: [phone]  and follow the prompts.  Office hours are 8:00 a.m. to 4:30 p.m. Monday - Friday. Please note that voicemails left after 4:00 p.m. may not be returned until the following business day.  We are closed weekends and major holidays. You have access to a nurse at all times for urgent questions. Please call the main number to the clinic Dept: [phone] and follow the prompts.   For any non-urgent questions, you may also contact your provider using MyChart. We now offer e-Visits for anyone 64 and older to request care online for non-urgent symptoms. For details visit [email or web address].   Also download the MyChart app! Go to the app store, search "MyChart", open the app, select [place], and log in with your MyChart username and password.

## 2024-04-05 ENCOUNTER — Other Ambulatory Visit: Payer: Self-pay

## 2024-04-05 DIAGNOSIS — C180 Malignant neoplasm of cecum: Secondary | ICD-10-CM

## 2024-04-05 DIAGNOSIS — C182 Malignant neoplasm of ascending colon: Secondary | ICD-10-CM

## 2024-04-05 DIAGNOSIS — D649 Anemia, unspecified: Secondary | ICD-10-CM

## 2024-04-05 DIAGNOSIS — Z5111 Encounter for antineoplastic chemotherapy: Secondary | ICD-10-CM

## 2024-04-05 NOTE — Progress Notes (Signed)
 Labs placed for tomorrow's visit to include pregnancy urinalysis.

## 2024-04-06 ENCOUNTER — Inpatient Hospital Stay

## 2024-04-06 ENCOUNTER — Inpatient Hospital Stay: Admitting: Oncology

## 2024-04-06 ENCOUNTER — Other Ambulatory Visit: Payer: Self-pay

## 2024-04-06 ENCOUNTER — Encounter: Payer: Self-pay | Admitting: Oncology

## 2024-04-06 VITALS — BP 118/74 | HR 80 | Temp 98.2°F | Resp 16 | Ht 65.0 in | Wt 157.3 lb

## 2024-04-06 DIAGNOSIS — Z95828 Presence of other vascular implants and grafts: Secondary | ICD-10-CM

## 2024-04-06 DIAGNOSIS — C182 Malignant neoplasm of ascending colon: Secondary | ICD-10-CM

## 2024-04-06 DIAGNOSIS — C180 Malignant neoplasm of cecum: Secondary | ICD-10-CM

## 2024-04-06 DIAGNOSIS — Z5111 Encounter for antineoplastic chemotherapy: Secondary | ICD-10-CM

## 2024-04-06 DIAGNOSIS — D649 Anemia, unspecified: Secondary | ICD-10-CM

## 2024-04-06 LAB — CBC WITH DIFFERENTIAL (CANCER CENTER ONLY)
Abs Immature Granulocytes: 0.01 K/uL (ref 0.00–0.07)
Basophils Absolute: 0 K/uL (ref 0.0–0.1)
Basophils Relative: 1 %
Eosinophils Absolute: 0.2 K/uL (ref 0.0–0.5)
Eosinophils Relative: 4 %
HCT: 30.8 % — ABNORMAL LOW (ref 36.0–46.0)
Hemoglobin: 10.5 g/dL — ABNORMAL LOW (ref 12.0–15.0)
Immature Granulocytes: 0 %
Lymphocytes Relative: 41 %
Lymphs Abs: 1.9 K/uL (ref 0.7–4.0)
MCH: 27.6 pg (ref 26.0–34.0)
MCHC: 34.1 g/dL (ref 30.0–36.0)
MCV: 81.1 fL (ref 80.0–100.0)
Monocytes Absolute: 0.4 K/uL (ref 0.1–1.0)
Monocytes Relative: 9 %
Neutro Abs: 2.2 K/uL (ref 1.7–7.7)
Neutrophils Relative %: 45 %
Platelet Count: 220 K/uL (ref 150–400)
RBC: 3.8 MIL/uL — ABNORMAL LOW (ref 3.87–5.11)
RDW: 16.3 % — ABNORMAL HIGH (ref 11.5–15.5)
WBC Count: 4.8 K/uL (ref 4.0–10.5)
nRBC: 0 % (ref 0.0–0.2)

## 2024-04-06 LAB — CMP (CANCER CENTER ONLY)
ALT: 12 U/L (ref 0–44)
AST: 16 U/L (ref 15–41)
Albumin: 4 g/dL (ref 3.5–5.0)
Alkaline Phosphatase: 42 U/L (ref 38–126)
Anion gap: 6 (ref 5–15)
BUN: 11 mg/dL (ref 6–20)
CO2: 30 mmol/L (ref 22–32)
Calcium: 9.1 mg/dL (ref 8.9–10.3)
Chloride: 99 mmol/L (ref 98–111)
Creatinine: 0.86 mg/dL (ref 0.44–1.00)
GFR, Estimated: >60 mL/min (ref >60)
Glucose, Bld: 89 mg/dL (ref 70–99)
Potassium: 3.8 mmol/L (ref 3.5–5.1)
Sodium: 135 mmol/L (ref 135–145)
Total Bilirubin: 0.6 mg/dL (ref 0.0–1.2)
Total Protein: 7.1 g/dL (ref 6.5–8.1)

## 2024-04-06 MED ORDER — SODIUM CHLORIDE 0.9% FLUSH
10.0000 mL | Freq: Once | INTRAVENOUS | Status: AC
  Administered 2024-04-06: 10 mL

## 2024-04-06 NOTE — Progress Notes (Signed)
 [facility]  ONCOLOGY CLINIC PROGRESS NOTE   Patient Care Team: Sun, Vyvyan, MD as PCP - General (Family Medicine) Dasie Leonor CROME, MD as Consulting Physician (General Surgery) Autumn Millman, MD as Consulting Physician (Oncology) Mai Lynwood FALCON, MD as Consulting Physician (Rheumatology)  PATIENT NAME: Sherry Abbott   MR#: [id number] DOB: 07-23-1976  Date of visit: 04/06/2024   ASSESSMENT & PLAN:   Sherry Abbott is a 48 y.o. 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  pT3, pN1a, cM0, stage III B disease.  MMR proficient.  Side effects to 5-FU infusion with jaw clenching, muscle spasms of the tongue and generalized muscle twitching.  Adenocarcinoma of cecum [facility]) Please review oncology history for additional details and timeline of events.    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 MMR proficient.   Previously I discussed diagnosis, staging, prognosis, plan of care, treatment options.  Reviewed NCCN guidelines.  On 02/07/2024, staging CT of the chest showed no evidence of intrathoracic metastatic disease.  Started cycle 1 of adjuvant FOLFOX on 02/15/2024.  Initially it seemed as if she has suffered an allergic reaction to oxaliplatin  with tongue muscle twitching, difficulty speaking, and jaw muscle tightness, the day after chemo.  Discussed alternatives.  Plan made to proceed with reduced dose oxaliplatin  at 50% at a slower rate over 3 hours with extra allergy medicines including Benadryl  and Pepcid .   She received cycle 2 of FOLFOX without 5-FU pump and dose reduced oxaliplatin  at a slower rate on 02/27/2024.  On 02/28/2024, she had to present to the ED again  with similar complaints of tongue muscle twitching, jaw clenching etc.  Symptoms probably resolved after stopping 5-FU pump in the ED.  It is now clear that she has allergic reaction to 5-FU infusion.  We cannot use it anymore because of the severity of reaction.  Discussed alternative option of capecitabine , although definitive and has similar mechanism of action as 5-FU and she may suffer similar side effect profile.  Patient willing to try capecitabine  with oxaliplatin .  Discussed side effect profile with capecitabine  including hand-foot syndrome, diarrhea, nausea, fatigue, cytopenias etc.   If she were to suffer similar side effects like before, then we will have to hold chemotherapy and continue close surveillance with Guardant reveal and CT imaging.  Patient was agreeable to proceeding with capecitabine  and she started that from 03/29/2024.  Dose was reduced to avoid toxicity/risk of reaction.  Calculated dose is 1500 mg p.o. every morning and 1000 mg p.o. nightly for 2 weeks on, 1 week off.  Will plan to do oxaliplatin  at half-strength at a slower rate with each cycle.  She started oxaliplatin  from 03/30/2024.    She is tolerating treatment fairly well so far.  Labs reveal no dose-limiting toxicities.    - She was previously referred to genetic counseling for genetic testing.  - Monitor with circulating tumor DNA test and CT scans every three months for two years.  Circulating tumor DNA test was negative on 02/06/2024.  -We will schedule colonoscopy one year post-surgery.  She wants to try to work remotely given the fact that she will be on immunosuppressive chemotherapy.  Provided a letter to reflect the same.  RTC in 2 weeks for  cycle 2 of XELOX.  Chemotherapy-induced constipation Constipation has been persistent despite the use of Miralax  and stool softeners. No bowel movement since Tuesday, with no sensation to defecate and no passage of gas. Abdominal bloating is present, but no  abdominal pain or vomiting. The constipation is likely related to chemotherapy. - Recommend use of Dulcolax suppository if no bowel movement by tomorrow. - Encourage increased dietary fiber intake and consider fiber supplements. - Maintain adequate hydration, aiming for 2-3 liters of fluid intake daily.  Chemotherapy-induced cold sensitivity Cold sensitivity is a known side effect of oxaliplatin , making it difficult to consume cold fluids. This has impacted hydration, especially in the initial days post-treatment. - Advise consumption of warm fluids to manage cold sensitivity.  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 Capecitabine  is proposed as an alternative due to its different side effect profile. - Discontinue 5-FU due to severe allergic reaction. - Monitor for any allergic reactions to capecitabine .  I reviewed lab results and outside records for this visit and discussed relevant results with the patient. Diagnosis, plan of care and treatment options were also discussed in detail with the patient. Opportunity provided to ask questions and answers provided to her apparent satisfaction. Provided instructions to call our clinic with any problems, questions or concerns prior to return visit. I recommended to continue follow-up with PCP and sub-specialists. She verbalized understanding and agreed with the plan.   NCCN guidelines have been consulted in the planning of this patient's care.  I spent a total of 30 minutes during this encounter with the patient including review of chart and various tests results, discussions about plan of care and coordination of care plan.   Chinita Patten, MD  04/06/2024 4:50 PM  [facility] - A DEPT OF [facility] [address] [place] [address] Dept: [phone] Dept Fax: [phone]    CHIEF COMPLAINT/ REASON FOR VISIT:   Invasive adenocarcinoma of the cecum, status post hemicolectomy on 01/19/2024.  pT3, pN1a, cM0, stage III B disease.  MMR proficient.   Current Treatment: Adjuvant systemic chemotherapy with FOLFOX started from 02/15/2024.  INTERVAL HISTORY:    Discussed the use of AI scribe software for clinical note transcription with the patient, who gave verbal consent to proceed.  History of Present Illness  She is currently receiving chemotherapy with oxaliplatin  and Xeloda . She experiences cold sensitivity, a known side effect of oxaliplatin . She is scheduled to complete her current cycle of Xeloda  on Wednesday night and will have her next oxaliplatin  treatment around September 12th.  She reports significant constipation, having not had a bowel movement since Tuesday, despite taking Miralax  and a stool softener. Her stool is not hard but remains difficult to pass, and she lacks the sensation to defecate. She has been experiencing bloating and a lack of gas passage since starting oxaliplatin , which began about five to six days ago. No abdominal pain or vomiting, although she does feel nauseated at times.  Her diet includes high-fiber foods such as apples, pears, lettuce, broccoli, kale, and spinach, and she has been trying to increase her fiber intake. She also reports difficulty with fluid intake due to cold sensitivity, but manages to drink about 2.4 liters per day using a habit tracker to monitor her intake.  No fever, chills, night sweats,  diarrhea, abdominal pain, or vomiting. Confirms constipation, bloating, and nausea.  I have reviewed the past medical history, past surgical history, social history and family history with the patient and they are unchanged from previous note.  HISTORY OF PRESENT ILLNESS:   ONCOLOGY HISTORY:   Patient had routine screening  colonoscopy on 01/12/2024, performed by Dr. Saintclair.  It showed a fungating, infiltrative, sessile, polypoid and ulcerated nonobstructing large mass in the cecum and in the ascending colon.  The mass was partially circumferential involving two thirds of the lumen circumference, measuring at least 7 cm in length.  Biopsies were obtained.  3 sessile polyps were found in the transverse colon, descending colon and sigmoid colon measuring up to 6 mm, removed.  Pathology from the cecal mass came back positive for invasive moderately differentiated adenocarcinoma.  MMR proficient.   On 01/16/2024, CT abdomen and pelvis showed 2.8 cm cyst in the left hepatic lobe, simple appearing.  No pathologic lymphadenopathy or other acute findings were noted.   She was referred for surgical oncology evaluation.  She was supposed to see Dr. Ann on 01/17/2024 but presented to the ED that same day with complaints of worsening right lower quadrant abdominal pain, nausea, vomiting and inability to tolerate oral intake.  She was admitted to the hospital for further evaluation and management.   She was seen by Dr. Dasie in the hospital and she underwent laparoscopic-assisted right hemicolectomy, excision of peritoneal cyst on 01/19/2024.  Final pathology showed invasive moderately differentiated adenocarcinoma with focal mucinous differentiation arising within a tubulovillous adenoma, measuring 6.7 cm in greatest dimension with involvement of submucosa, muscularis propria and into pericolonic adipose tissue.  1 out of 26 lymph nodes were positive for malignancy.  There was another single positive tumor deposit.  All margins were negative.  Grade 2.  No evidence of LVI or PNI.  MMR proficient.     On her consultation with us  on 01/25/2024, request placed for CT of the chest for staging.  On 02/07/2024, staging CT of the chest showed no evidence of intrathoracic metastatic disease.  pT3,pN1a, cM0, Stage III B disease.    Plan made for  adjuvant chemotherapy with modified FOLFOX-6, every 2 weeks for up to 12 cycles.  Started cycle 1 on 02/15/2024.  Initially it seemed as if she has suffered an allergic reaction to oxaliplatin  with tongue muscle twitching, difficulty speaking, and jaw muscle tightness, the day after chemo.  Discussed alternatives.  Plan made to proceed with reduced dose oxaliplatin  at 50% at a slower rate over 3 hours with extra allergy medicines including Benadryl  and Pepcid .   She received cycle 2 of FOLFOX without 5-FU pump and dose reduced oxaliplatin  at a slower rate on 02/27/2024.  On 02/28/2024, she had to present to the ED again with similar complaints of tongue muscle twitching, jaw clenching etc.  Symptoms probably resolved after stopping 5-FU pump in the ED.  It is now clear that she has allergic reaction to 5-FU infusion.  We cannot use it anymore because of the severity of reaction.  Patient willing to try capecitabine  with oxaliplatin .  If she were to suffer similar side effects, then we will have to hold chemotherapy and continue close surveillance with Guardant reveal and CT imaging.  Started Capecitabine  from 03/29/2024. Oxaliplatin  started from 03/30/24.  Tolerating this regimen well so far.   Oncology History  Adenocarcinoma of cecum (HCC)  01/17/2024 Initial Diagnosis   Adenocarcinoma of cecum (HCC)   01/25/2024 Cancer Staging  Staging form: Colon and Rectum, AJCC 8th Edition - Pathologic: Stage IIIB (pT3, pN1a, cM0) - Signed by Autumn Millman, MD on 02/15/2024 Total positive nodes: 1 Histologic grading system: 4 grade system Histologic grade (G): G2 Residual tumor (R): R0   02/15/2024 - 02/29/2024 Chemotherapy   Patient is on Treatment Plan : COLORECTAL FOLFOX q14d x 3 months     03/30/2024 -  Chemotherapy   Patient is on Treatment Plan : COLORECTAL Xelox (Capeox)(130/850) q21d         REVIEW OF SYSTEMS:   Review of Systems - Oncology  All other pertinent systems were reviewed with the  patient and are negative.  ALLERGIES: She is allergic to betadine [povidone iodine], blueberry [vaccinium angustifolium], milk-related compounds, neosporin [neomycin-bacitracin zn-polymyx], povidone-iodine, shellfish allergy, sulfa antibiotics, adrucil  [fluorouracil ], neomycin, and other.  MEDICATIONS:  Current Outpatient Medications  Medication Sig Dispense Refill   B Complex Vitamins (VITAMIN B-COMPLEX) TABS Take 1 tablet by mouth daily with supper.     capecitabine  (XELODA ) 500 MG tablet Take 3 tablets (1500 mg) by mouth in the morning and 2 tablets (1000 mg) by mouth in the evening for 14 days on, followed by 7 days off. Repeat every 21 days.Take within 30 minutes after meals. 70 tablet 7   cetirizine  (ZYRTEC  ALLERGY) 10 MG tablet Take 1 tablet (10 mg total) by mouth at bedtime. 90 tablet 0   COLACE 100 MG capsule Take 200 mg by mouth in the morning and at bedtime.     dexamethasone  (DECADRON ) 4 MG tablet Take 2 tablets (8 mg total) by mouth daily. Start the day after chemotherapy for 2 days. Take with food. 30 tablet 1   diphenhydrAMINE  (BENADRYL ) 25 MG tablet Take 1 tablet (25 mg total) by mouth every 6 (six) hours as needed. (Patient taking differently: Take 25 mg by mouth every 6 (six) hours as needed for allergies or itching.) 30 tablet 0   famotidine  (PEPCID ) 20 MG tablet Take 1 tablet (20 mg total) by mouth 2 (two) times daily. (Patient taking differently: Take 20 mg by mouth 2 (two) times daily as needed for heartburn or indigestion.) 30 tablet 0   fluticasone  (FLONASE ) 50 MCG/ACT nasal spray Place 2 sprays into both nostrils daily. 9.9 mL 0   folic acid  (FOLVITE ) 1 MG tablet Take 1 mg by mouth daily.     lidocaine -prilocaine  (EMLA ) cream Apply to affected area once 30 g 3   mometasone (NASONEX) 50 MCG/ACT nasal spray Place 2 sprays into the nose daily as needed (Allergies).     montelukast  (SINGULAIR ) 10 MG tablet Take 10 mg by mouth at bedtime.     Multiple Vitamin (MULTIVITAMIN WITH  MINERALS) TABS tablet Take 1 tablet by mouth daily.     ondansetron  (ZOFRAN ) 8 MG tablet Take 1 tablet (8 mg total) by mouth every 8 (eight) hours as needed for nausea or vomiting. Start on the third day after chemotherapy. 30 tablet 1   oxyCODONE  (OXY IR/ROXICODONE ) 5 MG immediate release tablet Take 1 tablet (5 mg total) by mouth every 8 (eight) hours as needed for severe pain (pain score 7-10) (5 mg for pain score 4-6, 10 mg for pain score 7-10). 12 tablet 0   pantoprazole  (PROTONIX ) 20 MG tablet Take 20 mg by mouth 2 (two) times daily. (Patient taking differently: Take 20 mg by mouth 2 (two) times daily as needed for heartburn or indigestion (On hold for now).)     polyethylene glycol powder (GLYCOLAX /MIRALAX ) 17 GM/SCOOP powder Take  17 g by mouth daily as needed. 238 g 0   prochlorperazine  (COMPAZINE ) 10 MG tablet Take 1 tablet (10 mg total) by mouth every 6 (six) hours as needed for nausea or vomiting. 30 tablet 1   TYLENOL  500 MG tablet Take 500-1,000 mg by mouth every 6 (six) hours as needed for mild pain (pain score 1-3), headache or fever.     Current Facility-Administered Medications  Medication Dose Route Frequency Provider Last Rate Last Admin   Fremanezumab -vfrm SOSY 225 mg  225 mg Subcutaneous Once Ines Onetha NOVAK, MD         VITALS:   Blood pressure 118/74, pulse 80, temperature 98.2 F (36.8 C), temperature source Temporal, resp. rate 16, height 5' 5 (1.651 m), weight 157 lb 4.8 oz (71.4 kg), SpO2 99%.  Wt Readings from Last 3 Encounters:  04/06/24 157 lb 4.8 oz (71.4 kg)  03/29/24 156 lb 3 oz (70.8 kg)  03/23/24 152 lb 14.4 oz (69.4 kg)    Body mass index is 26.18 kg/m.    Onc Performance Status - 04/06/24 1524       ECOG Perf Status   ECOG Perf Status Restricted in physically strenuous activity but ambulatory and able to carry out work of a light or sedentary nature, e.g., light house work, office work      KPS SCALE   KPS % SCORE Normal activity with effort,  some s/s of disease           PHYSICAL EXAM:   Physical Exam Constitutional:      General: She is not in acute distress.    Appearance: Normal appearance.  HENT:     Head: Normocephalic and atraumatic.  Cardiovascular:     Rate and Rhythm: Normal rate.  Pulmonary:     Effort: Pulmonary effort is normal. No respiratory distress.  Chest:     Comments: Right-sided Port-A-Cath in place without any signs of infection Abdominal:     General: There is no distension.  Neurological:     General: No focal deficit present.     Mental Status: She is alert and oriented to person, place, and time.  Psychiatric:        Mood and Affect: Mood normal.        Behavior: Behavior normal.       LABORATORY DATA:   I have reviewed the data as listed.  Results for orders placed or performed in visit on 04/06/24  CMP (Cancer Center only)  Result Value Ref Range   Sodium 135 135 - 145 mmol/L   Potassium 3.8 3.5 - 5.1 mmol/L   Chloride 99 98 - 111 mmol/L   CO2 30 22 - 32 mmol/L   Glucose, Bld 89 70 - 99 mg/dL   BUN 11 6 - 20 mg/dL   Creatinine 9.13 9.55 - 1.00 mg/dL   Calcium  9.1 8.9 - 10.3 mg/dL   Total Protein 7.1 6.5 - 8.1 g/dL   Albumin  4.0 3.5 - 5.0 g/dL   AST 16 15 - 41 U/L   ALT 12 0 - 44 U/L   Alkaline Phosphatase 42 38 - 126 U/L   Total Bilirubin 0.6 0.0 - 1.2 mg/dL   GFR, Estimated >39 >39 mL/min   Anion gap 6 5 - 15  CBC with Differential (Cancer Center Only)  Result Value Ref Range   WBC Count 4.8 4.0 - 10.5 K/uL   RBC 3.80 (L) 3.87 - 5.11 MIL/uL   Hemoglobin 10.5 (L) 12.0 - 15.0  g/dL   HCT 69.1 (L) 63.9 - 53.9 %   MCV 81.1 80.0 - 100.0 fL   MCH 27.6 26.0 - 34.0 pg   MCHC 34.1 30.0 - 36.0 g/dL   RDW 83.6 (H) 88.4 - 84.4 %   Platelet Count 220 150 - 400 K/uL   nRBC 0.0 0.0 - 0.2 %   Neutrophils Relative % 45 %   Neutro Abs 2.2 1.7 - 7.7 K/uL   Lymphocytes Relative 41 %   Lymphs Abs 1.9 0.7 - 4.0 K/uL   Monocytes Relative 9 %   Monocytes Absolute 0.4 0.1 - 1.0  K/uL   Eosinophils Relative 4 %   Eosinophils Absolute 0.2 0.0 - 0.5 K/uL   Basophils Relative 1 %   Basophils Absolute 0.0 0.0 - 0.1 K/uL   Immature Granulocytes 0 %   Abs Immature Granulocytes 0.01 0.00 - 0.07 K/uL     RADIOGRAPHIC STUDIES:  No recent pertinent imaging available to review.   CODE STATUS:  Code Status History     Date Active Date Inactive Code Status Order ID Comments User Context   01/17/2024 0459 01/23/2024 1727 Full Code [id number]  Alfornia Madison, MD ED    Questions for Most Recent Historical Code Status (Order [id number])     Question Answer   By: Consent: discussion documented in EHR            No orders of the defined types were placed in this encounter.    Future Appointments  Date Time Provider Department Center  04/16/2024  9:00 AM Woodard Burnard BROCKS, Counselor [facility] None  04/16/2024 10:30 AM [facility] FLUSH [facility] None  04/20/2024 10:00 AM [facility] FLUSH [facility] None  04/20/2024 10:30 AM Jamya Starry, MD [facility] None  04/20/2024 11:00 AM [facility] INFUSION [facility] None  04/20/2024 11:15 AM Ivonne Harlene RAMAN, RD [facility] None  04/20/2024  1:00 PM Karlene Few, LCSW [facility] None      This document was completed utilizing speech recognition software. Grammatical errors, random word insertions, pronoun errors, and incomplete sentences are an occasional consequence of this system due to software limitations, ambient noise, and hardware issues. Any formal questions or concerns about the content, text or information contained within the body of this dictation should be directly addressed to the provider for clarification.

## 2024-04-06 NOTE — Assessment & Plan Note (Signed)
 Please review oncology history for additional details and timeline of events.    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 MMR proficient.   Previously I discussed diagnosis, staging, prognosis, plan of care, treatment options.  Reviewed NCCN guidelines.  On 02/07/2024, staging CT of the chest showed no evidence of intrathoracic metastatic disease.  Started cycle 1 of adjuvant FOLFOX on 02/15/2024.  Initially it seemed as if she has suffered an allergic reaction to oxaliplatin  with tongue muscle twitching, difficulty speaking, and jaw muscle tightness, the day after chemo.  Discussed alternatives.  Plan made to proceed with reduced dose oxaliplatin  at 50% at a slower rate over 3 hours with extra allergy medicines including Benadryl  and Pepcid .   She received cycle 2 of FOLFOX without 5-FU pump and dose reduced oxaliplatin  at a slower rate on 02/27/2024.  On 02/28/2024, she had to present to the ED again with similar complaints of tongue muscle twitching, jaw clenching etc.  Symptoms probably resolved after stopping 5-FU pump in the ED.  It is now clear that she has allergic reaction to 5-FU infusion.  We cannot use it anymore because of the severity of reaction.  Discussed alternative option of capecitabine , although definitive and has similar mechanism of action as 5-FU and she may suffer similar side effect profile.  Patient willing to try capecitabine  with oxaliplatin .  Discussed side effect profile with capecitabine  including hand-foot syndrome, diarrhea, nausea, fatigue, cytopenias etc.   If she were to suffer similar side effects like before, then we will have to hold chemotherapy and continue close surveillance with Guardant reveal and CT imaging.  Patient was agreeable to proceeding with capecitabine  and she started that from 03/29/2024.  Dose was reduced to avoid toxicity/risk of reaction.  Calculated dose is 1500 mg p.o. every  morning and 1000 mg p.o. nightly for 2 weeks on, 1 week off.  Will plan to do oxaliplatin  at half-strength at a slower rate with each cycle.  She started oxaliplatin  from 03/30/2024.    She is tolerating treatment fairly well so far.  Labs reveal no dose-limiting toxicities.    - She was previously referred to genetic counseling for genetic testing.  - Monitor with circulating tumor DNA test and CT scans every three months for two years.  Circulating tumor DNA test was negative on 02/06/2024.  -We will schedule colonoscopy one year post-surgery.  She wants to try to work remotely given the fact that she will be on immunosuppressive chemotherapy.  Provided a letter to reflect the same.  RTC in 2 weeks for cycle 2 of XELOX.

## 2024-04-06 NOTE — Progress Notes (Signed)
 Patient refused to take her urine pregnancy test today. She did say, There is no way I could be pregnant. Dr. Autumn aware and agreed to wave the pregnancy test for this visit.

## 2024-04-07 ENCOUNTER — Other Ambulatory Visit: Payer: Self-pay

## 2024-04-11 ENCOUNTER — Other Ambulatory Visit: Payer: Self-pay

## 2024-04-11 ENCOUNTER — Telehealth: Payer: Self-pay

## 2024-04-11 NOTE — Telephone Encounter (Signed)
 Notified the pt regarding her FMLA forms being competed, faxed,and confirmation received. Pt stated she will pick up her hard copy today. No questions or concerns to be noted at this time.

## 2024-04-13 ENCOUNTER — Other Ambulatory Visit: Payer: Self-pay

## 2024-04-13 ENCOUNTER — Other Ambulatory Visit (HOSPITAL_COMMUNITY): Payer: Self-pay

## 2024-04-13 NOTE — Progress Notes (Signed)
 Specialty Pharmacy Refill Coordination Note  Spoke with Sherry Abbott  Sherry Abbott is a 48 y.o. female contacted today regarding refills of specialty medication(s) Capecitabine  (XELODA )  Doses on hand: 0, next cycle on 04/20/24  Patient requested: Delivery   Delivery date: 04/17/24   Verified address: [address] [place] [address]  Medication will be filled on 04/16/24.

## 2024-04-16 ENCOUNTER — Inpatient Hospital Stay: Attending: Oncology | Admitting: Genetic Counselor

## 2024-04-16 ENCOUNTER — Encounter: Payer: Self-pay | Admitting: Genetic Counselor

## 2024-04-16 ENCOUNTER — Inpatient Hospital Stay

## 2024-04-16 ENCOUNTER — Other Ambulatory Visit: Payer: Self-pay

## 2024-04-16 DIAGNOSIS — C180 Malignant neoplasm of cecum: Secondary | ICD-10-CM | POA: Insufficient documentation

## 2024-04-16 DIAGNOSIS — Z800 Family history of malignant neoplasm of digestive organs: Secondary | ICD-10-CM

## 2024-04-16 DIAGNOSIS — K5909 Other constipation: Secondary | ICD-10-CM | POA: Diagnosis not present

## 2024-04-16 DIAGNOSIS — Z803 Family history of malignant neoplasm of breast: Secondary | ICD-10-CM

## 2024-04-16 DIAGNOSIS — D72819 Decreased white blood cell count, unspecified: Secondary | ICD-10-CM | POA: Diagnosis not present

## 2024-04-16 DIAGNOSIS — Z5111 Encounter for antineoplastic chemotherapy: Secondary | ICD-10-CM | POA: Diagnosis present

## 2024-04-16 DIAGNOSIS — Z79899 Other long term (current) drug therapy: Secondary | ICD-10-CM | POA: Diagnosis not present

## 2024-04-16 NOTE — Progress Notes (Signed)
 REFERRING PROVIDER: Autumn Millman, MD   PRIMARY PROVIDER:  Sun, Vyvyan, MD  PRIMARY REASON FOR VISIT:  Adenocarcinoma of cecum (HCC)  Family history of malignant neoplasm of gastrointestinal tract  Family history of malignant neoplasm of breast   HISTORY OF PRESENT ILLNESS:   Sherry Abbott, a 48 y.o. female, was seen for a [place] cancer genetics consultation at the request of Dr. Austin due to a personal and family history of cancer.  Sherry Abbott presents to clinic today to discuss the possibility of a hereditary predisposition to cancer, to discuss genetic testing, and to further clarify her future cancer risks, as well as potential cancer risks for family members.   In 2025, at the age of 55, Sherry Abbott was diagnosed with adenocarcinoma of the cecum. She was treated with a partial colectomy and with chemotherapy.   First colonoscopy completed in 2025, identified three additional tubular adenoma. IHC intact.    CANCER HISTORY:  Oncology History  Adenocarcinoma of cecum (HCC)  01/17/2024 Initial Diagnosis   Adenocarcinoma of cecum (HCC)   01/25/2024 Cancer Staging   Staging form: Colon and Rectum, AJCC 8th Edition - Pathologic: Stage IIIB (pT3, pN1a, cM0) - Signed by Autumn Millman, MD on 02/15/2024 Total positive nodes: 1 Histologic grading system: 4 grade system Histologic grade (G): G2 Residual tumor (R): R0   02/15/2024 - 02/29/2024 Chemotherapy   Patient is on Treatment Plan : COLORECTAL FOLFOX q14d x 3 months     03/30/2024 -  Chemotherapy   Patient is on Treatment Plan : COLORECTAL Xelox (Capeox)(130/850) q21d        RISK FACTORS:  First live birth at age 36.  OCP use for approximately 8 years.  Ovaries intact: yes.  Uterus intact: yes.  Colonoscopy: yes; see above. Mammogram within the last year: yes.  Past Medical History:  Diagnosis Date   Allergic rhinitis    Allergic to food    blueberry   Allergic to shellfish    Asthma    Cancer (HCC)     Dermatographic urticaria    Hidradenitis suppurativa    Labral tear of right hip joint    surgery   Low back pain    Nausea and vomiting 01/17/2024   Other chronic allergic conjunctivitis    Polyarthralgia     Past Surgical History:  Procedure Laterality Date   CESAREAN SECTION  2008   DERMOID CYST  EXCISION Right 03/2022   right arm in dermatologist office   LABRAL REPAIR Right 04/2021   right hip   LAPAROSCOPIC PARTIAL COLECTOMY Right 01/19/2024   Procedure: LAPAROSCOPIC PARTIAL COLECTOMY;  Surgeon: Dasie Leonor CROME, MD;  Location: [facility] OR;  Service: General;  Laterality: Right;  LAPAROSCOPIC ASSISTED RIGHT HEMI COLECTOMY   PORTACATH PLACEMENT N/A 02/03/2024   Procedure: INSERTION PORTACATH RIGHT SUBCLAVIAN;  Surgeon: Dasie Leonor CROME, MD;  Location: [facility] OR;  Service: General;  Laterality: N/A;   ROTATOR CUFF REPAIR Right    2015    Social History   Socioeconomic History   Marital status: Married    Spouse name: Not on file   Number of children: Not on file   Years of education: Not on file   Highest education level: Not on file  Occupational History   Not on file  Tobacco Use   Smoking status: Never   Smokeless tobacco: Never  Vaping Use   Vaping status: Never Used  Substance and Sexual Activity   Alcohol use: No   Drug use: No  Sexual activity: Not on file  Other Topics Concern   Not on file  Social History Narrative   Caffiene occasional coffee (herbal tea)   Work : [employer], research lab (study respiratory diseases)   Social Drivers of Corporate investment banker Strain: Not on file  Food Insecurity: No Food Insecurity (03/07/2024)   Hunger Vital Sign    Worried About Running Out of Food in the Last Year: Never true    Ran Out of Food in the Last Year: Never true  Transportation Needs: No Transportation Needs (03/07/2024)   PRAPARE - Administrator, Civil Service (Medical): No    Lack of Transportation (Non-Medical): No  Physical Activity: Not on  file  Stress: Not on file  Social Connections: Unknown (01/17/2024)   Social Connection and Isolation Panel    Frequency of Communication with Friends and Family: Not on file    Frequency of Social Gatherings with Friends and Family: Not on file    Attends Religious Services: Not on file    Active Member of Clubs or Organizations: Not on file    Attends Banker Meetings: Not on file    Marital Status: Married     FAMILY HISTORY:  We obtained a detailed, 4-generation family history.  Significant diagnoses are listed below: Family History  Problem Relation Age of Onset   Migraines Mother    Pulmonary disease Mother    Cervical cancer Mother 40   Colon cancer Father 62   Breast cancer Maternal Aunt 30 - 69   Lung cancer Paternal Uncle 73 - 32    Sherry Abbott is unaware of previous family history of genetic testing for hereditary cancer risks. There is no reported Ashkenazi Jewish ancestry.     GENETIC COUNSELING ASSESSMENT: Sherry Abbott is a 48 y.o. female with a personal and family history of cancer which is somewhat suggestive of a hereditary predisposition to cancer given her diagnosis of colorectal cancer under the age of 54. We, therefore, discussed and recommended the following at today's visit.   DISCUSSION: We discussed that 5 - 10% of cancer is hereditary, with most cases of hereditary colon associated with Lynch syndrome (caused by pathogenic variants in either MLH1, MSH2, MSH6, PMS2, EPCAM).  There are other genes that can be associated with hereditary colon and breast cancer syndromes.  We discussed that testing is beneficial for several reasons including knowing how to follow individuals after completing their treatment, identifying whether potential treatment options would be beneficial, and understanding if other family members could be at risk for cancer and allowing them to undergo genetic testing.   We reviewed the characteristics, features and inheritance  patterns of hereditary cancer syndromes. We also discussed genetic testing, including the appropriate family members to test, the process of testing, insurance coverage and turn-around-time for results. We discussed the implications of a negative, positive, carrier and/or variant of uncertain significant result. We recommended Sherry Abbott pursue genetic testing for a panel that includes genes associated with colon and breast cancer.   Sherry Abbott  was offered a common hereditary cancer panel (40 genes) and an expanded pan-cancer panel (77 genes). Sherry Abbott was informed of the benefits and limitations of each panel, including that expanded pan-cancer panels contain genes that do not have clear management guidelines at this point in time.  We also discussed that as the number of genes included on a panel increases, the chances of variants of uncertain significance increases. After considering the  benefits and limitations of each gene panel, Sherry Abbott declined to complete testing today. She would like to consider her options and will contact us  with questions or if she elects to move forward with testing.  Based on Sherry Abbott's personal and family history of cancer, she meets medical criteria for genetic testing. Despite that she meets criteria, she may still have an out of pocket cost. We discussed that if her out of pocket cost for testing is over $100, the laboratory should contact them to discuss self-pay prices, patient pay assistance programs, if applicable, and other billing options.  We discussed that some people do not want to undergo genetic testing due to fear of genetic discrimination.  A federal law called the Genetic Information Non-Discrimination Act (GINA) of 2008 helps protect individuals against genetic discrimination based on their genetic test results.  It impacts both health insurance and employment.  With health insurance, it protects against increased premiums, being kicked off  insurance or being forced to take a test in order to be insured.  For employment it protects against hiring, firing and promoting decisions based on genetic test results.  GINA does not apply to those in the [employer], those who work for companies with less than 15 employees, and new life insurance or long-term disability insurance policies.  Health status due to a cancer diagnosis is not protected under GINA.  Sherry Abbott had questions regarding the way that de-identified information is utilized by the lab, such as with quality control, optimization, and research. Will clarify with the lab if she has the option to opt out of these utilizations.   PLAN: After considering the risks, benefits, and limitations, Sherry Abbott declined to complete testing today. She plans to consider her options We understand this decision and remain available to coordinate genetic testing at any time in the future. We, therefore, recommend Sherry Abbott continue to follow the cancer screening guidelines given by her oncology and primary healthcare provider.  Lastly, we encouraged Sherry Abbott to remain in contact with cancer genetics annually so that we can continuously update the family history and inform her of any changes in cancer genetics and testing that may be of benefit for this family.   Sherry Abbott questions were answered to her satisfaction today. Our contact information was provided should additional questions or concerns arise. Thank you for the referral and allowing us  to share in the care of your patient.   Burnard Ogren, MS, [facility] Licensed, Retail [email or web address] phone: [phone]   60 minutes were spent on the date of the encounter in service to the patient including preparation, face-to-face consultation, documentation and care coordination.  The patient was seen alone.  Drs. Gudena and/or Lanny were available to discuss this case as needed.    _______________________________________________________________________ For Office Staff:  Number of people involved in session: 1 Was an Intern/ student involved with case: no

## 2024-04-17 ENCOUNTER — Other Ambulatory Visit: Payer: Self-pay

## 2024-04-17 NOTE — Progress Notes (Signed)
 Specialty Pharmacy Ongoing Clinical Assessment Note  Sherry Abbott is a 48 y.o. female who is being followed by the specialty pharmacy service for RxSp Oncology   Patient's specialty medication(s) reviewed today: Capecitabine  (XELODA )   Missed doses in the last 4 weeks: 0   Patient/Caregiver asked additional questions regarding side effects, see intervention notes below.  Therapeutic benefit summary: Patient is achieving benefit (Per office visit notes, 02/07/2024 staging CT of the chest showed no evidence of intrathoracic metastatic disease.)   Adverse events/side effects summary: Experienced adverse events/side effects (Patient reports nausea, consitpation/ GI slow down, sore gums, hyperpigmented tongue, palms, and soles, pain in feet.)   Patient's therapy is appropriate to: Continue    Goals Addressed             This Visit's Progress    Maintain optimal adherence to therapy   On track    Patient is initiating therapy. Patient will maintain adherence         Follow up: 3 months  Silvano LOISE Dolly Specialty Pharmacist    Clinical Intervention Note  Clinical Intervention Notes: Patient reports ongoing nausea and constipation/GI slowing, which she has already discussed with her provider. She now also reports sore gums, hyperpigmentation of the tongue, palms, and soles, as well as pain on the soles of her feet. She is currently using Udderly Smooth. I recommended trying OTC Voltaren gel for her hands and feet, which she agreed to. I also advised her to contact her provider's office to discuss the possibility of a prescription mouthwash to help with gum discomfort. Patient expressed understanding.   Clinical Intervention Outcomes: Prevention of an adverse drug event   Silvano LOISE Dolly Karel Santa

## 2024-04-19 NOTE — Progress Notes (Addendum)
 Oxaliplatin  admin duration increased from 2 to 4 hours per Dr. Autumn d/t pt allergies/sensitivities per Dr. Autumn. Infusion charge RN made aware.  Cleaster Shiffer, PharmD, MBA

## 2024-04-20 ENCOUNTER — Inpatient Hospital Stay

## 2024-04-20 ENCOUNTER — Inpatient Hospital Stay: Admitting: Oncology

## 2024-04-20 ENCOUNTER — Encounter: Payer: Self-pay | Admitting: Oncology

## 2024-04-20 ENCOUNTER — Inpatient Hospital Stay: Admitting: Dietician

## 2024-04-20 VITALS — BP 108/73 | HR 72 | Temp 98.6°F | Resp 18 | Ht 65.0 in | Wt 160.2 lb

## 2024-04-20 DIAGNOSIS — T451X5A Adverse effect of antineoplastic and immunosuppressive drugs, initial encounter: Secondary | ICD-10-CM

## 2024-04-20 DIAGNOSIS — K5909 Other constipation: Secondary | ICD-10-CM | POA: Diagnosis not present

## 2024-04-20 DIAGNOSIS — L271 Localized skin eruption due to drugs and medicaments taken internally: Secondary | ICD-10-CM

## 2024-04-20 DIAGNOSIS — C180 Malignant neoplasm of cecum: Secondary | ICD-10-CM | POA: Diagnosis not present

## 2024-04-20 DIAGNOSIS — R110 Nausea: Secondary | ICD-10-CM

## 2024-04-20 LAB — CBC WITH DIFFERENTIAL (CANCER CENTER ONLY)
Abs Immature Granulocytes: 0 K/uL (ref 0.00–0.07)
Basophils Absolute: 0 K/uL (ref 0.0–0.1)
Basophils Relative: 1 %
Eosinophils Absolute: 0.1 K/uL (ref 0.0–0.5)
Eosinophils Relative: 2 %
HCT: 30.1 % — ABNORMAL LOW (ref 36.0–46.0)
Hemoglobin: 10.5 g/dL — ABNORMAL LOW (ref 12.0–15.0)
Immature Granulocytes: 0 %
Lymphocytes Relative: 33 %
Lymphs Abs: 1.2 K/uL (ref 0.7–4.0)
MCH: 28.8 pg (ref 26.0–34.0)
MCHC: 34.9 g/dL (ref 30.0–36.0)
MCV: 82.5 fL (ref 80.0–100.0)
Monocytes Absolute: 0.4 K/uL (ref 0.1–1.0)
Monocytes Relative: 10 %
Neutro Abs: 2 K/uL (ref 1.7–7.7)
Neutrophils Relative %: 54 %
Platelet Count: 219 K/uL (ref 150–400)
RBC: 3.65 MIL/uL — ABNORMAL LOW (ref 3.87–5.11)
RDW: 19 % — ABNORMAL HIGH (ref 11.5–15.5)
WBC Count: 3.8 K/uL — ABNORMAL LOW (ref 4.0–10.5)
nRBC: 0 % (ref 0.0–0.2)

## 2024-04-20 LAB — CMP (CANCER CENTER ONLY)
ALT: 17 U/L (ref 0–44)
AST: 21 U/L (ref 15–41)
Albumin: 4.1 g/dL (ref 3.5–5.0)
Alkaline Phosphatase: 37 U/L — ABNORMAL LOW (ref 38–126)
Anion gap: 3 — ABNORMAL LOW (ref 5–15)
BUN: 10 mg/dL (ref 6–20)
CO2: 28 mmol/L (ref 22–32)
Calcium: 8.8 mg/dL — ABNORMAL LOW (ref 8.9–10.3)
Chloride: 108 mmol/L (ref 98–111)
Creatinine: 0.78 mg/dL (ref 0.44–1.00)
GFR, Estimated: >60 mL/min (ref >60)
Glucose, Bld: 88 mg/dL (ref 70–99)
Potassium: 3.9 mmol/L (ref 3.5–5.1)
Sodium: 139 mmol/L (ref 135–145)
Total Bilirubin: 0.7 mg/dL (ref 0.0–1.2)
Total Protein: 7 g/dL (ref 6.5–8.1)

## 2024-04-20 LAB — PREGNANCY, URINE: Preg Test, Ur: NEGATIVE

## 2024-04-20 MED ORDER — OXALIPLATIN CHEMO INJECTION 100 MG/20ML
65.0000 mg/m2 | Freq: Once | INTRAVENOUS | Status: AC
  Administered 2024-04-20: 115 mg via INTRAVENOUS
  Filled 2024-04-20: qty 3

## 2024-04-20 MED ORDER — DEXAMETHASONE SODIUM PHOSPHATE 10 MG/ML IJ SOLN
10.0000 mg | Freq: Once | INTRAMUSCULAR | Status: AC
  Administered 2024-04-20: 10 mg via INTRAVENOUS
  Filled 2024-04-20: qty 1

## 2024-04-20 MED ORDER — PALONOSETRON HCL INJECTION 0.25 MG/5ML
0.2500 mg | Freq: Once | INTRAVENOUS | Status: AC
  Administered 2024-04-20: 0.25 mg via INTRAVENOUS
  Filled 2024-04-20: qty 5

## 2024-04-20 MED ORDER — DIPHENHYDRAMINE HCL 50 MG/ML IJ SOLN
25.0000 mg | Freq: Once | INTRAMUSCULAR | Status: AC
  Administered 2024-04-20: 25 mg via INTRAVENOUS
  Filled 2024-04-20: qty 1

## 2024-04-20 MED ORDER — FAMOTIDINE IN NACL 20-0.9 MG/50ML-% IV SOLN
20.0000 mg | Freq: Once | INTRAVENOUS | Status: AC
  Administered 2024-04-20: 20 mg via INTRAVENOUS
  Filled 2024-04-20: qty 50

## 2024-04-20 MED ORDER — DEXTROSE 5 % IV SOLN: INTRAVENOUS | Status: DC

## 2024-04-20 NOTE — Patient Instructions (Signed)
 [facility] - A DEPT OF [facility]  Discharge Instructions: Thank you for choosing [facility] to provide your oncology and hematology care.   If you have a lab appointment with the Cancer Center, please go directly to the Cancer Center and check in at the registration area.   Wear comfortable clothing and clothing appropriate for easy access to any Portacath or PICC line.   We strive to give you quality time with your provider. You may need to reschedule your appointment if you arrive late (15 or more minutes).  Arriving late affects you and other patients whose appointments are after yours.  Also, if you miss three or more appointments without notifying the office, you may be dismissed from the clinic at the provider's discretion.      For prescription refill requests, have your pharmacy contact our office and allow 72 hours for refills to be completed.    Today you received the following chemotherapy and/or immunotherapy agents: oxaliplatin      To help prevent nausea and vomiting after your treatment, we encourage you to take your nausea medication as directed.  BELOW ARE SYMPTOMS THAT SHOULD BE REPORTED IMMEDIATELY: *FEVER GREATER THAN 100.4 F (38 C) OR HIGHER *CHILLS OR SWEATING *NAUSEA AND VOMITING THAT IS NOT CONTROLLED WITH YOUR NAUSEA MEDICATION *UNUSUAL SHORTNESS OF BREATH *UNUSUAL BRUISING OR BLEEDING *URINARY PROBLEMS (pain or burning when urinating, or frequent urination) *BOWEL PROBLEMS (unusual diarrhea, constipation, pain near the anus) TENDERNESS IN MOUTH AND THROAT WITH OR WITHOUT PRESENCE OF ULCERS (sore throat, sores in mouth, or a toothache) UNUSUAL RASH, SWELLING OR PAIN  UNUSUAL VAGINAL DISCHARGE OR ITCHING   Items with * indicate a potential emergency and should be followed up as soon as possible or go to the Emergency Department if any problems should occur.  Please show the CHEMOTHERAPY ALERT CARD or  IMMUNOTHERAPY ALERT CARD at check-in to the Emergency Department and triage nurse.  Should you have questions after your visit or need to cancel or reschedule your appointment, please contact [facility] - A DEPT OF [facility]  Dept: [phone]  and follow the prompts.  Office hours are 8:00 a.m. to 4:30 p.m. Monday - Friday. Please note that voicemails left after 4:00 p.m. may not be returned until the following business day.  We are closed weekends and major holidays. You have access to a nurse at all times for urgent questions. Please call the main number to the clinic Dept: [phone] and follow the prompts.   For any non-urgent questions, you may also contact your provider using MyChart. We now offer e-Visits for anyone 64 and older to request care online for non-urgent symptoms. For details visit [email or web address].   Also download the MyChart app! Go to the app store, search "MyChart", open the app, select [place], and log in with your MyChart username and password.

## 2024-04-20 NOTE — Assessment & Plan Note (Signed)
 Experiencing significant constipation, with bowel movements only once or twice a week. Constipation worsened post-surgery and with current chemotherapy regimen. - Continue using Miralax , stool softeners, and Dulcolax as needed for constipation relief.

## 2024-04-20 NOTE — Assessment & Plan Note (Signed)
 Experiencing significant nausea without vomiting. Currently taking Zofran . Considering reducing Zofran  dose to manage side effects. - Reduce Zofran  dose to 4 mg by breaking the tablet in half, starting in three days.

## 2024-04-20 NOTE — Assessment & Plan Note (Addendum)
 Please review oncology history for additional details and timeline of events.    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 MMR proficient.   Previously I discussed diagnosis, staging, prognosis, plan of care, treatment options.  Reviewed NCCN guidelines.  On 02/07/2024, staging CT of the chest showed no evidence of intrathoracic metastatic disease.  Started cycle 1 of adjuvant FOLFOX on 02/15/2024. Initially it seemed as if she has suffered an allergic reaction to oxaliplatin  with tongue muscle twitching, difficulty speaking, and jaw muscle tightness, the day after chemo.  Discussed alternatives.  Plan made to proceed with reduced dose oxaliplatin  at 50% at a slower rate over 3 hours with extra allergy medicines including Benadryl  and Pepcid .   She received cycle 2 of FOLFOX without 5-FU pump and dose reduced oxaliplatin  at a slower rate on 02/27/2024.  On 02/28/2024, she had to present to the ED again with similar complaints of tongue muscle twitching, jaw clenching etc.  Symptoms probably resolved after stopping 5-FU pump in the ED. It is now clear that she has allergic reaction to 5-FU infusion.  We cannot use it anymore because of the severity of reaction.  Discussed alternative option of capecitabine , although definitive and has similar mechanism of action as 5-FU and she may suffer similar side effect profile.  Patient willing to try capecitabine  with oxaliplatin .  Discussed side effect profile with capecitabine  including hand-foot syndrome, diarrhea, nausea, fatigue, cytopenias etc.   If she were to suffer similar side effects like before, then we will have to hold chemotherapy and continue close surveillance with Guardant reveal and CT imaging.  Patient was agreeable to proceeding with capecitabine  and she started that from 03/29/2024.  Dose was reduced to avoid toxicity/risk of reaction.  Calculated dose is 1500 mg p.o. every morning and  1000 mg p.o. nightly for 2 weeks on, 1 week off.  Will plan to do oxaliplatin  at half-strength at a slower rate with each cycle.  She started oxaliplatin  from 03/30/2024.    She is tolerating treatment fairly well so far.  Labs today reveal mild leukopenia with white count of 3800 but ANC is 2000.  No dose-limiting toxicities.  Will proceed with oxaliplatin  today.  She will start cycle 2 of capecitabine  from today.  - She was previously referred to genetic counseling for genetic testing.  - Monitor with circulating tumor DNA test and CT scans every three months for two years.  Circulating tumor DNA test was negative on 02/06/2024.  -We will schedule colonoscopy one year post-surgery.  RTC in 3 weeks for cycle 3 of XELOX.

## 2024-04-20 NOTE — Progress Notes (Signed)
 [facility]  ONCOLOGY CLINIC PROGRESS NOTE   Patient Care Team: Sun, Vyvyan, MD as PCP - General (Family Medicine) Dasie Leonor CROME, MD as Consulting Physician (General Surgery) Autumn Millman, MD as Consulting Physician (Oncology) Mai Lynwood FALCON, MD as Consulting Physician (Rheumatology)  PATIENT NAME: Sherry Abbott   MR#: [id number] DOB: [birth year]  Date of visit: 04/20/2024   ASSESSMENT & PLAN:   Sherry Abbott is a 48 y.o. 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  pT3, pN1a, cM0, stage III B disease.  MMR proficient.  Side effects to 5-FU infusion with jaw clenching, muscle spasms of the tongue and generalized muscle twitching.  Adenocarcinoma of cecum [facility]) Please review oncology history for additional details and timeline of events.    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 MMR proficient.   Previously I discussed diagnosis, staging, prognosis, plan of care, treatment options.  Reviewed NCCN guidelines.  On 02/07/2024, staging CT of the chest showed no evidence of intrathoracic metastatic disease.  Started cycle 1 of adjuvant FOLFOX on 02/15/2024. Initially it seemed as if she has suffered an allergic reaction to oxaliplatin  with tongue muscle twitching, difficulty speaking, and jaw muscle tightness, the day after chemo.  Discussed alternatives.  Plan made to proceed with reduced dose oxaliplatin  at 50% at a slower rate over 3 hours with extra allergy medicines including Benadryl  and Pepcid .   She received cycle 2 of FOLFOX without 5-FU pump and dose reduced oxaliplatin  at a slower rate on 02/27/2024.  On 02/28/2024, she had to present to the ED again  with similar complaints of tongue muscle twitching, jaw clenching etc.  Symptoms probably resolved after stopping 5-FU pump in the ED. It is now clear that she has allergic reaction to 5-FU infusion.  We cannot use it anymore because of the severity of reaction.  Discussed alternative option of capecitabine , although definitive and has similar mechanism of action as 5-FU and she may suffer similar side effect profile.  Patient willing to try capecitabine  with oxaliplatin .  Discussed side effect profile with capecitabine  including hand-foot syndrome, diarrhea, nausea, fatigue, cytopenias etc.   If she were to suffer similar side effects like before, then we will have to hold chemotherapy and continue close surveillance with Guardant reveal and CT imaging.  Patient was agreeable to proceeding with capecitabine  and she started that from 03/29/2024.  Dose was reduced to avoid toxicity/risk of reaction.  Calculated dose is 1500 mg p.o. every morning and 1000 mg p.o. nightly for 2 weeks on, 1 week off.  Will plan to do oxaliplatin  at half-strength at a slower rate with each cycle.  She started oxaliplatin  from 03/30/2024.    She is tolerating treatment fairly well so far.  Labs today reveal mild leukopenia with white count of 3800 but ANC is 2000.  No dose-limiting toxicities.  Will proceed with oxaliplatin  today.  She will start cycle 2 of capecitabine  from today.  - She was previously referred to genetic counseling for genetic testing.  - Monitor with circulating tumor DNA test and CT scans every three months for two years.  Circulating tumor DNA test was negative on 02/06/2024.  -We will schedule colonoscopy one year post-surgery.  RTC in 3 weeks for cycle  3 of XELOX.  Hand foot syndrome Experiencing hand-foot syndrome with discoloration and pain in hands and feet.  Grade 1. - Continue using moisturizer twice daily. - Apply Voltaren gel to affected areas, starting with feet and then hands as  needed.  Chronic constipation Experiencing significant constipation, with bowel movements only once or twice a week. Constipation worsened post-surgery and with current chemotherapy regimen. - Continue using Miralax , stool softeners, and Dulcolax as needed for constipation relief.  Chemotherapy-induced nausea Experiencing significant nausea without vomiting. Currently taking Zofran . Considering reducing Zofran  dose to manage side effects. - Reduce Zofran  dose to 4 mg by breaking the tablet in half, starting in three days.  Chemotherapy-induced cold sensitivity Cold sensitivity is a known side effect of oxaliplatin , making it difficult to consume cold fluids. This has impacted hydration, especially in the initial days post-treatment. - Advise consumption of warm fluids to manage cold sensitivity.  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 Capecitabine  is proposed as an alternative due to its different side effect profile. - Discontinue 5-FU due to severe allergic reaction. - Monitor for any allergic reactions to capecitabine .  I reviewed lab results and outside records for this visit and discussed relevant results with the patient. Diagnosis, plan of care and treatment options were also discussed in detail with the patient. Opportunity provided to ask questions and answers provided to her apparent satisfaction. Provided instructions to call our clinic with any problems, questions or concerns prior to return visit. I recommended to continue follow-up with PCP and sub-specialists. She verbalized understanding and agreed with the plan.   NCCN guidelines have been consulted in the planning of this patient's care.  I spent a total of 40 minutes during this encounter with the patient including review of chart  and various tests results, discussions about plan of care and coordination of care plan.   Chinita Patten, MD  04/20/2024 12:27 PM  [facility] - A DEPT OF [facility] [address] [place] [address] Dept: [phone] Dept Fax: [phone]    CHIEF COMPLAINT/ REASON FOR VISIT:   Invasive adenocarcinoma of the cecum, status post hemicolectomy on 01/19/2024.  pT3, pN1a, cM0, stage III B disease.  MMR proficient.   Current Treatment: Adjuvant systemic chemotherapy with FOLFOX started from 02/15/2024.  INTERVAL HISTORY:    Discussed the use of AI scribe software for clinical note transcription with the patient, who gave verbal consent to proceed.  History of Present Illness Sherry Abbott is a 48 year old female undergoing chemotherapy who presents with side effects from treatment.  She is experiencing side effects from her chemotherapy regimen, which includes Xeloda  (capecitabine ). She started a new cycle today, taking three tablets in the morning and two at night. Despite a reduced strength, she has discoloration and pain in her hands and feet. She uses moisturizer twice daily and has started using over-the-counter Voltaren gel for pain relief.  She reports gum bleeding and sensitivity, particularly when eating.  She maintains a plant-based diet and has been experiencing constipation since her surgery, which has worsened over the last two to three weeks. She uses Miralax , stool softeners, and Dulcolax for relief, but bowel movements remain infrequent. She notes that her system feels sluggish and she experiences cramping.  She experiences significant nausea but minimal vomiting,  similar to her experience during pregnancy. She is currently taking Zofran  for nausea, as Compazine  was discontinued due to potential interactions. She is allergic to milk products, which causes itching and gastrointestinal  symptoms.  No heart rate problems, chest pain, or trouble breathing.  I have reviewed the past medical history, past surgical history, social history and family history with the patient and they are unchanged from previous note.  HISTORY OF PRESENT ILLNESS:   ONCOLOGY HISTORY:   Patient had routine screening colonoscopy on 01/12/2024, performed by Dr. Saintclair.  It showed a fungating, infiltrative, sessile, polypoid and ulcerated nonobstructing large mass in the cecum and in the ascending colon.  The mass was partially circumferential involving two thirds of the lumen circumference, measuring at least 7 cm in length.  Biopsies were obtained.  3 sessile polyps were found in the transverse colon, descending colon and sigmoid colon measuring up to 6 mm, removed.  Pathology from the cecal mass came back positive for invasive moderately differentiated adenocarcinoma.  MMR proficient.   On 01/16/2024, CT abdomen and pelvis showed 2.8 cm cyst in the left hepatic lobe, simple appearing.  No pathologic lymphadenopathy or other acute findings were noted.   She was referred for surgical oncology evaluation.  She was supposed to see Dr. Ann on 01/17/2024 but presented to the ED that same day with complaints of worsening right lower quadrant abdominal pain, nausea, vomiting and inability to tolerate oral intake.  She was admitted to the hospital for further evaluation and management.   She was seen by Dr. Dasie in the hospital and she underwent laparoscopic-assisted right hemicolectomy, excision of peritoneal cyst on 01/19/2024.  Final pathology showed invasive moderately differentiated adenocarcinoma with focal mucinous differentiation arising within a tubulovillous adenoma, measuring 6.7 cm in greatest dimension with involvement of submucosa, muscularis propria and into pericolonic adipose tissue.  1 out of 26 lymph nodes were positive for malignancy.  There was another single positive tumor deposit.  All margins  were negative.  Grade 2.  No evidence of LVI or PNI.  MMR proficient.     On her consultation with us  on 01/25/2024, request placed for CT of the chest for staging.  On 02/07/2024, staging CT of the chest showed no evidence of intrathoracic metastatic disease.  pT3,pN1a, cM0, Stage III B disease.    Plan made for adjuvant chemotherapy with modified FOLFOX-6, every 2 weeks for up to 12 cycles.  Started cycle 1 on 02/15/2024.  Initially it seemed as if she has suffered an allergic reaction to oxaliplatin  with tongue muscle twitching, difficulty speaking, and jaw muscle tightness, the day after chemo.  Discussed alternatives.  Plan made to proceed with reduced dose oxaliplatin  at 50% at a slower rate over 3 hours with extra allergy medicines including Benadryl  and Pepcid .   She received cycle 2 of FOLFOX without 5-FU pump and dose reduced oxaliplatin  at a slower rate on 02/27/2024.  On 02/28/2024, she had to present to the ED again with similar complaints of tongue muscle twitching, jaw clenching etc.  Symptoms probably resolved after stopping 5-FU pump in the ED.  It is now clear that she has allergic reaction to 5-FU infusion.  We cannot use it anymore because of the severity of reaction.  Patient willing to try capecitabine  with oxaliplatin .  If she were to suffer similar side effects, then we will have to hold chemotherapy and continue close surveillance with Guardant reveal and CT imaging.  Started Capecitabine  from 03/29/2024. Oxaliplatin  started from 03/30/24.  Tolerating this regimen well so far.   Oncology History  Adenocarcinoma of cecum (HCC)  01/17/2024 Initial Diagnosis   Adenocarcinoma of cecum (HCC)   01/25/2024 Cancer Staging   Staging form: Colon and Rectum, AJCC 8th Edition - Pathologic: Stage IIIB (pT3, pN1a, cM0) - Signed by Autumn Millman, MD on 02/15/2024 Total positive nodes: 1 Histologic grading system: 4 grade system Histologic grade (G): G2 Residual tumor (R): R0    02/15/2024 - 02/29/2024 Chemotherapy   Patient is on Treatment Plan : COLORECTAL FOLFOX q14d x 3 months     03/30/2024 -  Chemotherapy   Patient is on Treatment Plan : COLORECTAL Xelox (Capeox)(130/850) q21d         REVIEW OF SYSTEMS:   Review of Systems - Oncology  All other pertinent systems were reviewed with the patient and are negative.  ALLERGIES: She is allergic to betadine [povidone iodine], blueberry [vaccinium angustifolium], milk-related compounds, neosporin [neomycin-bacitracin zn-polymyx], povidone-iodine, shellfish allergy, sulfa antibiotics, adrucil  [fluorouracil ], neomycin, and other.  MEDICATIONS:  Current Outpatient Medications  Medication Sig Dispense Refill   B Complex Vitamins (VITAMIN B-COMPLEX) TABS Take 1 tablet by mouth daily with supper.     bisacodyl  (DULCOLAX) 5 MG EC tablet Take 5 mg by mouth daily as needed for moderate constipation.     capecitabine  (XELODA ) 500 MG tablet Take 3 tablets (1500 mg) by mouth in the morning and 2 tablets (1000 mg) by mouth in the evening for 14 days on, followed by 7 days off. Repeat every 21 days.Take within 30 minutes after meals. 70 tablet 7   cetirizine  (ZYRTEC  ALLERGY) 10 MG tablet Take 1 tablet (10 mg total) by mouth at bedtime. 90 tablet 0   COLACE 100 MG capsule Take 200 mg by mouth in the morning and at bedtime.     dexamethasone  (DECADRON ) 4 MG tablet Take 2 tablets (8 mg total) by mouth daily. Start the day after chemotherapy for 2 days. Take with food. 30 tablet 1   diphenhydrAMINE  (BENADRYL ) 25 MG tablet Take 1 tablet (25 mg total) by mouth every 6 (six) hours as needed. 30 tablet 0   famotidine  (PEPCID ) 20 MG tablet Take 1 tablet (20 mg total) by mouth 2 (two) times daily. 30 tablet 0   fluticasone  (FLONASE ) 50 MCG/ACT nasal spray Place 2 sprays into both nostrils daily. 9.9 mL 0   folic acid  (FOLVITE ) 1 MG tablet Take 1 mg by mouth daily.     lidocaine -prilocaine  (EMLA ) cream Apply to affected area once 30 g 3    mometasone (NASONEX) 50 MCG/ACT nasal spray Place 2 sprays into the nose daily as needed (Allergies).     montelukast  (SINGULAIR ) 10 MG tablet Take 10 mg by mouth at bedtime.     Multiple Vitamin (MULTIVITAMIN WITH MINERALS) TABS tablet Take 1 tablet by mouth daily.     ondansetron  (ZOFRAN ) 8 MG tablet Take 1 tablet (8 mg total) by mouth every 8 (eight) hours as needed for nausea or vomiting. Start on the third day after chemotherapy. 30 tablet 1   oxyCODONE  (OXY IR/ROXICODONE ) 5 MG immediate release tablet Take 1 tablet (5 mg total) by mouth every 8 (eight) hours as needed for severe pain (pain score 7-10) (5 mg for pain score 4-6, 10 mg for pain score 7-10). 12 tablet 0   pantoprazole  (PROTONIX ) 20 MG tablet Take 20 mg by mouth 2 (two) times daily.     polyethylene glycol powder (GLYCOLAX /MIRALAX ) 17 GM/SCOOP powder Take 17 g by mouth daily  as needed. 238 g 0   prochlorperazine  (COMPAZINE ) 10 MG tablet Take 1 tablet (10 mg total) by mouth every 6 (six) hours as needed for nausea or vomiting. 30 tablet 1   TYLENOL  500 MG tablet Take 500-1,000 mg by mouth every 6 (six) hours as needed for mild pain (pain score 1-3), headache or fever.     Current Facility-Administered Medications  Medication Dose Route Frequency Provider Last Rate Last Admin   Fremanezumab -vfrm SOSY 225 mg  225 mg Subcutaneous Once Ahern, Antonia B, MD       Facility-Administered Medications Ordered in Other Visits  Medication Dose Route Frequency Provider Last Rate Last Admin   dextrose  5 % solution   Intravenous Continuous Kathleena Freeman, MD 10 mL/hr at 04/20/24 1133 New Bag at 04/20/24 1133   oxaliplatin  (ELOXATIN ) 115 mg in dextrose  5 % 500 mL chemo infusion  65 mg/m2 (Treatment Plan Recorded) Intravenous Once Jacion Dismore, MD         VITALS:   Blood pressure 108/73, pulse 72, temperature 98.6 F (37 C), temperature source Temporal, resp. rate 18, height 5' 5 (1.651 m), weight 160 lb 3.2 oz (72.7 kg), SpO2 100%.  Wt  Readings from Last 3 Encounters:  04/20/24 160 lb 3.2 oz (72.7 kg)  04/06/24 157 lb 4.8 oz (71.4 kg)  03/29/24 156 lb 3 oz (70.8 kg)    Body mass index is 26.66 kg/m.    Onc Performance Status - 04/20/24 1044       ECOG Perf Status   ECOG Perf Status Restricted in physically strenuous activity but ambulatory and able to carry out work of a light or sedentary nature, e.g., light house work, office work      KPS SCALE   KPS % SCORE Normal activity with effort, some s/s of disease           PHYSICAL EXAM:   Physical Exam Constitutional:      General: She is not in acute distress.    Appearance: Normal appearance.  HENT:     Head: Normocephalic and atraumatic.  Cardiovascular:     Rate and Rhythm: Normal rate.  Pulmonary:     Effort: Pulmonary effort is normal. No respiratory distress.  Chest:     Comments: Right-sided Port-A-Cath in place without any signs of infection Abdominal:     General: There is no distension.  Neurological:     General: No focal deficit present.     Mental Status: She is alert and oriented to person, place, and time.  Psychiatric:        Mood and Affect: Mood normal.        Behavior: Behavior normal.       LABORATORY DATA:   I have reviewed the data as listed.  Results for orders placed or performed in visit on 04/20/24  CMP (Cancer Center only)  Result Value Ref Range   Sodium 139 135 - 145 mmol/L   Potassium 3.9 3.5 - 5.1 mmol/L   Chloride 108 98 - 111 mmol/L   CO2 28 22 - 32 mmol/L   Glucose, Bld 88 70 - 99 mg/dL   BUN 10 6 - 20 mg/dL   Creatinine 9.21 9.55 - 1.00 mg/dL   Calcium  8.8 (L) 8.9 - 10.3 mg/dL   Total Protein 7.0 6.5 - 8.1 g/dL   Albumin  4.1 3.5 - 5.0 g/dL   AST 21 15 - 41 U/L   ALT 17 0 - 44 U/L   Alkaline Phosphatase 37 (  L) 38 - 126 U/L   Total Bilirubin 0.7 0.0 - 1.2 mg/dL   GFR, Estimated >39 >39 mL/min   Anion gap 3 (L) 5 - 15  CBC with Differential (Cancer Center Only)  Result Value Ref Range   WBC  Count 3.8 (L) 4.0 - 10.5 K/uL   RBC 3.65 (L) 3.87 - 5.11 MIL/uL   Hemoglobin 10.5 (L) 12.0 - 15.0 g/dL   HCT 69.8 (L) 63.9 - 53.9 %   MCV 82.5 80.0 - 100.0 fL   MCH 28.8 26.0 - 34.0 pg   MCHC 34.9 30.0 - 36.0 g/dL   RDW 80.9 (H) 88.4 - 84.4 %   Platelet Count 219 150 - 400 K/uL   nRBC 0.0 0.0 - 0.2 %   Neutrophils Relative % 54 %   Neutro Abs 2.0 1.7 - 7.7 K/uL   Lymphocytes Relative 33 %   Lymphs Abs 1.2 0.7 - 4.0 K/uL   Monocytes Relative 10 %   Monocytes Absolute 0.4 0.1 - 1.0 K/uL   Eosinophils Relative 2 %   Eosinophils Absolute 0.1 0.0 - 0.5 K/uL   Basophils Relative 1 %   Basophils Absolute 0.0 0.0 - 0.1 K/uL   Immature Granulocytes 0 %   Abs Immature Granulocytes 0.00 0.00 - 0.07 K/uL  Pregnancy, urine  Result Value Ref Range   Preg Test, Ur NEGATIVE NEGATIVE     RADIOGRAPHIC STUDIES:  No recent pertinent imaging available to review.   CODE STATUS:  Code Status History     Date Active Date Inactive Code Status Order ID Comments User Context   01/17/2024 0459 01/23/2024 1727 Full Code [id number]  Alfornia Madison, MD ED    Questions for Most Recent Historical Code Status (Order [id number])     Question Answer   By: Consent: discussion documented in EHR            Orders Placed This Encounter  Procedures   CBC with Differential (Cancer Center Only)    Standing Status:   Future    Expected Date:   05/11/2024    Expiration Date:   05/11/2025   CMP (Cancer Center only)    Standing Status:   Future    Expected Date:   05/11/2024    Expiration Date:   05/11/2025   CBC with Differential (Cancer Center Only)    Standing Status:   Future    Expected Date:   06/01/2024    Expiration Date:   06/01/2025   CMP (Cancer Center only)    Standing Status:   Future    Expected Date:   06/01/2024    Expiration Date:   06/01/2025     Future Appointments  Date Time Provider Department Center  04/20/2024  1:00 PM Karlene Few, LCSW [facility] None      This  document was completed utilizing speech recognition software. Grammatical errors, random word insertions, pronoun errors, and incomplete sentences are an occasional consequence of this system due to software limitations, ambient noise, and hardware issues. Any formal questions or concerns about the content, text or information contained within the body of this dictation should be directly addressed to the provider for clarification.

## 2024-04-20 NOTE — Progress Notes (Signed)
 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  S/p hemicolectomy on 6/12.  Started adjuvant folfox 7/9. 5-FU discontinued due to toxicity. Patient currently receiving oxaliplatin  + capecitabine  (Xelox) q21d. Patient is under the care of Dr. Autumn.   Met with patient in infusion. She is sleepy from Benadryl , but agreeable to visit. Patient reports ongoing constipation despite bowel regimen which includes capful of miralax  + colace x2/day. Pt did have small-medium BM this morning without relief. She complains of bloating and early satiety. She is drinking on average 2.5 L water  which she is tracking via app. MD has recommended increasing daily colace and adding dulcolax. Patient feeling overwhelmed by pill burden.   Diet liberalized per surgery on 7/25. She is glad to be eating usual plant-based diet. Recalls smoothie (fruit, spinach, avocado, oat milk) for breakfast. Had roasted broccoli/cauliflower for lunch and white bean soup with kale for dinner.   Medications: reviewed   Labs: reviewed   Anthropometrics: Wt 160 lb 3.2 oz - increased (constipation with bloating)  8/29 - 157 lb 4.8 oz 8/15 - 152 lb 14.4 oz 8/5 - 151 lb 11.2 oz    NUTRITION DIAGNOSIS: Altered GI function - continues    INTERVENTION:  Encourage small frequent meals/snacks given satiety - include protein source at every meal Continue good hydration Bowel regimen (miralax  x1, colace x2 BID, dulcolax x2) per MD Support and encouragement    MONITORING, EVALUATION, GOAL: wt trends, intake    NEXT VISIT: Friday October 3 during infusion

## 2024-04-20 NOTE — Assessment & Plan Note (Signed)
 Experiencing hand-foot syndrome with discoloration and pain in hands and feet.  Grade 1. - Continue using moisturizer twice daily. - Apply Voltaren gel to affected areas, starting with feet and then hands as needed.

## 2024-04-22 ENCOUNTER — Other Ambulatory Visit: Payer: Self-pay

## 2024-04-24 ENCOUNTER — Telehealth: Payer: Self-pay | Admitting: Oncology

## 2024-04-24 ENCOUNTER — Telehealth: Payer: Self-pay

## 2024-04-24 NOTE — Telephone Encounter (Signed)
 Scheduled appointments per WQ. Called and left a VM with appointment details for the patient.

## 2024-04-24 NOTE — Telephone Encounter (Signed)
 Called to update patient of her next appointment with Dr. Autumn and Oncology Tx. Left a voicemail.

## 2024-04-25 ENCOUNTER — Other Ambulatory Visit: Payer: Self-pay

## 2024-04-25 ENCOUNTER — Inpatient Hospital Stay

## 2024-04-25 NOTE — Progress Notes (Signed)
 [facility] CSW Progress Note  Clinical Child psychotherapist contacted patient by phone to follow up on psychosocial needs as she continues through treatment. Patient has continued to experience intense side effects from treatment that are impacting several life domains. CSW and patient discussed quality of life and motivator for treatment. CSW allowed space for patient to emotionally process experiences and provided continued support, the emotional and mental impacts are evident. CSW discussed Palliative Care team, as another layer of support with medical provider, whom she has a high level of trust with. Patient / medical provider agreed palliative referral is appropriate. CSW added patient to email list for GI support group.        Follow Up Plan:  CSW will follow-up with patient by phone  1 week     Lizbeth Sprague, LCSW Clinical Social Worker [facility]

## 2024-05-01 ENCOUNTER — Other Ambulatory Visit: Payer: Self-pay

## 2024-05-01 DIAGNOSIS — C180 Malignant neoplasm of cecum: Secondary | ICD-10-CM

## 2024-05-02 ENCOUNTER — Other Ambulatory Visit: Payer: Self-pay

## 2024-05-04 ENCOUNTER — Other Ambulatory Visit: Payer: Self-pay

## 2024-05-04 ENCOUNTER — Other Ambulatory Visit: Payer: Self-pay | Admitting: Pharmacy Technician

## 2024-05-04 NOTE — Progress Notes (Signed)
 Specialty Pharmacy Refill Coordination Note  Zettie Gootee is a 48 y.o. female contacted today regarding refills of specialty medication(s) Capecitabine  (XELODA )   Patient requested Delivery   Delivery date: 05/08/24   Verified address: [address]  [place] [address]   Medication will be filled on 05/07/24. Resume meds on 10/3 - currently on break.

## 2024-05-07 ENCOUNTER — Other Ambulatory Visit: Payer: Self-pay

## 2024-05-10 ENCOUNTER — Other Ambulatory Visit: Payer: Self-pay

## 2024-05-10 DIAGNOSIS — C180 Malignant neoplasm of cecum: Secondary | ICD-10-CM

## 2024-05-10 DIAGNOSIS — C182 Malignant neoplasm of ascending colon: Secondary | ICD-10-CM

## 2024-05-11 ENCOUNTER — Encounter: Payer: Self-pay | Admitting: Oncology

## 2024-05-11 ENCOUNTER — Other Ambulatory Visit: Payer: Self-pay

## 2024-05-11 ENCOUNTER — Inpatient Hospital Stay

## 2024-05-11 ENCOUNTER — Inpatient Hospital Stay: Attending: Oncology | Admitting: Dietician

## 2024-05-11 ENCOUNTER — Inpatient Hospital Stay: Admitting: Oncology

## 2024-05-11 VITALS — BP 108/70 | HR 77 | Temp 97.7°F | Resp 16 | Ht 65.0 in | Wt 160.0 lb

## 2024-05-11 DIAGNOSIS — R112 Nausea with vomiting, unspecified: Secondary | ICD-10-CM | POA: Diagnosis not present

## 2024-05-11 DIAGNOSIS — M7989 Other specified soft tissue disorders: Secondary | ICD-10-CM | POA: Insufficient documentation

## 2024-05-11 DIAGNOSIS — R253 Fasciculation: Secondary | ICD-10-CM | POA: Insufficient documentation

## 2024-05-11 DIAGNOSIS — C182 Malignant neoplasm of ascending colon: Secondary | ICD-10-CM

## 2024-05-11 DIAGNOSIS — D125 Benign neoplasm of sigmoid colon: Secondary | ICD-10-CM | POA: Insufficient documentation

## 2024-05-11 DIAGNOSIS — D701 Agranulocytosis secondary to cancer chemotherapy: Secondary | ICD-10-CM | POA: Diagnosis not present

## 2024-05-11 DIAGNOSIS — Z882 Allergy status to sulfonamides status: Secondary | ICD-10-CM | POA: Diagnosis not present

## 2024-05-11 DIAGNOSIS — K7689 Other specified diseases of liver: Secondary | ICD-10-CM | POA: Diagnosis not present

## 2024-05-11 DIAGNOSIS — T451X5A Adverse effect of antineoplastic and immunosuppressive drugs, initial encounter: Secondary | ICD-10-CM | POA: Diagnosis not present

## 2024-05-11 DIAGNOSIS — C180 Malignant neoplasm of cecum: Secondary | ICD-10-CM

## 2024-05-11 DIAGNOSIS — Z91013 Allergy to seafood: Secondary | ICD-10-CM | POA: Insufficient documentation

## 2024-05-11 DIAGNOSIS — M792 Neuralgia and neuritis, unspecified: Secondary | ICD-10-CM | POA: Diagnosis not present

## 2024-05-11 DIAGNOSIS — M62838 Other muscle spasm: Secondary | ICD-10-CM | POA: Insufficient documentation

## 2024-05-11 DIAGNOSIS — Z79899 Other long term (current) drug therapy: Secondary | ICD-10-CM | POA: Diagnosis not present

## 2024-05-11 DIAGNOSIS — Z881 Allergy status to other antibiotic agents status: Secondary | ICD-10-CM | POA: Diagnosis not present

## 2024-05-11 DIAGNOSIS — Z888 Allergy status to other drugs, medicaments and biological substances status: Secondary | ICD-10-CM | POA: Insufficient documentation

## 2024-05-11 DIAGNOSIS — K068 Other specified disorders of gingiva and edentulous alveolar ridge: Secondary | ICD-10-CM | POA: Diagnosis not present

## 2024-05-11 DIAGNOSIS — R5383 Other fatigue: Secondary | ICD-10-CM | POA: Insufficient documentation

## 2024-05-11 DIAGNOSIS — M79641 Pain in right hand: Secondary | ICD-10-CM | POA: Diagnosis not present

## 2024-05-11 DIAGNOSIS — G8190 Hemiplegia, unspecified affecting unspecified side: Secondary | ICD-10-CM | POA: Insufficient documentation

## 2024-05-11 DIAGNOSIS — M069 Rheumatoid arthritis, unspecified: Secondary | ICD-10-CM | POA: Insufficient documentation

## 2024-05-11 DIAGNOSIS — Z79630 Long term (current) use of alkylating agent: Secondary | ICD-10-CM | POA: Insufficient documentation

## 2024-05-11 DIAGNOSIS — Z5111 Encounter for antineoplastic chemotherapy: Secondary | ICD-10-CM | POA: Insufficient documentation

## 2024-05-11 DIAGNOSIS — J45909 Unspecified asthma, uncomplicated: Secondary | ICD-10-CM | POA: Insufficient documentation

## 2024-05-11 DIAGNOSIS — L271 Localized skin eruption due to drugs and medicaments taken internally: Secondary | ICD-10-CM | POA: Diagnosis not present

## 2024-05-11 DIAGNOSIS — R4781 Slurred speech: Secondary | ICD-10-CM | POA: Diagnosis not present

## 2024-05-11 DIAGNOSIS — Z9049 Acquired absence of other specified parts of digestive tract: Secondary | ICD-10-CM | POA: Insufficient documentation

## 2024-05-11 DIAGNOSIS — Z91011 Allergy to milk products, unspecified: Secondary | ICD-10-CM | POA: Insufficient documentation

## 2024-05-11 LAB — CBC WITH DIFFERENTIAL (CANCER CENTER ONLY)
Abs Immature Granulocytes: 0.01 K/uL (ref 0.00–0.07)
Basophils Absolute: 0 K/uL (ref 0.0–0.1)
Basophils Relative: 1 %
Eosinophils Absolute: 0.1 K/uL (ref 0.0–0.5)
Eosinophils Relative: 3 %
HCT: 29.5 % — ABNORMAL LOW (ref 36.0–46.0)
Hemoglobin: 10.3 g/dL — ABNORMAL LOW (ref 12.0–15.0)
Immature Granulocytes: 0 %
Lymphocytes Relative: 35 %
Lymphs Abs: 1 K/uL (ref 0.7–4.0)
MCH: 29.2 pg (ref 26.0–34.0)
MCHC: 34.9 g/dL (ref 30.0–36.0)
MCV: 83.6 fL (ref 80.0–100.0)
Monocytes Absolute: 0.4 K/uL (ref 0.1–1.0)
Monocytes Relative: 12 %
Neutro Abs: 1.5 K/uL — ABNORMAL LOW (ref 1.7–7.7)
Neutrophils Relative %: 49 %
Platelet Count: 190 K/uL (ref 150–400)
RBC: 3.53 MIL/uL — ABNORMAL LOW (ref 3.87–5.11)
RDW: 20.2 % — ABNORMAL HIGH (ref 11.5–15.5)
WBC Count: 3 K/uL — ABNORMAL LOW (ref 4.0–10.5)
nRBC: 0 % (ref 0.0–0.2)

## 2024-05-11 LAB — CMP (CANCER CENTER ONLY)
ALT: 13 U/L (ref 0–44)
AST: 21 U/L (ref 15–41)
Albumin: 4.2 g/dL (ref 3.5–5.0)
Alkaline Phosphatase: 37 U/L — ABNORMAL LOW (ref 38–126)
Anion gap: 5 (ref 5–15)
BUN: 15 mg/dL (ref 6–20)
CO2: 30 mmol/L (ref 22–32)
Calcium: 9.3 mg/dL (ref 8.9–10.3)
Chloride: 105 mmol/L (ref 98–111)
Creatinine: 0.83 mg/dL (ref 0.44–1.00)
GFR, Estimated: >60 mL/min (ref >60)
Glucose, Bld: 89 mg/dL (ref 70–99)
Potassium: 3.8 mmol/L (ref 3.5–5.1)
Sodium: 140 mmol/L (ref 135–145)
Total Bilirubin: 1.4 mg/dL — ABNORMAL HIGH (ref 0.0–1.2)
Total Protein: 7.4 g/dL (ref 6.5–8.1)

## 2024-05-11 LAB — PREGNANCY, URINE: Preg Test, Ur: NEGATIVE

## 2024-05-11 MED ORDER — FAMOTIDINE IN NACL 20-0.9 MG/50ML-% IV SOLN
20.0000 mg | Freq: Once | INTRAVENOUS | Status: AC
  Administered 2024-05-11: 20 mg via INTRAVENOUS
  Filled 2024-05-11: qty 50

## 2024-05-11 MED ORDER — DEXTROSE 5 % IV SOLN: INTRAVENOUS | Status: DC

## 2024-05-11 MED ORDER — DEXAMETHASONE SODIUM PHOSPHATE 10 MG/ML IJ SOLN
10.0000 mg | Freq: Once | INTRAMUSCULAR | Status: AC
  Administered 2024-05-11: 10 mg via INTRAVENOUS
  Filled 2024-05-11: qty 1

## 2024-05-11 MED ORDER — OXALIPLATIN CHEMO INJECTION 100 MG/20ML
65.0000 mg/m2 | Freq: Once | INTRAVENOUS | Status: AC
  Administered 2024-05-11: 115 mg via INTRAVENOUS
  Filled 2024-05-11: qty 3

## 2024-05-11 MED ORDER — DIPHENHYDRAMINE HCL 50 MG/ML IJ SOLN
25.0000 mg | Freq: Once | INTRAMUSCULAR | Status: AC
  Administered 2024-05-11: 25 mg via INTRAVENOUS
  Filled 2024-05-11: qty 1

## 2024-05-11 MED ORDER — PALONOSETRON HCL INJECTION 0.25 MG/5ML
0.2500 mg | Freq: Once | INTRAVENOUS | Status: AC
  Administered 2024-05-11: 0.25 mg via INTRAVENOUS
  Filled 2024-05-11: qty 5

## 2024-05-11 NOTE — Patient Instructions (Signed)
 [facility] - A DEPT OF [facility]  Discharge Instructions: Thank you for choosing [facility] to provide your oncology and hematology care.   If you have a lab appointment with the Cancer Center, please go directly to the Cancer Center and check in at the registration area.   Wear comfortable clothing and clothing appropriate for easy access to any Portacath or PICC line.   We strive to give you quality time with your provider. You may need to reschedule your appointment if you arrive late (15 or more minutes).  Arriving late affects you and other patients whose appointments are after yours.  Also, if you miss three or more appointments without notifying the office, you may be dismissed from the clinic at the provider's discretion.      For prescription refill requests, have your pharmacy contact our office and allow 72 hours for refills to be completed.    Today you received the following chemotherapy and/or immunotherapy agents: Oxaliplatin  (Eloxatin )    To help prevent nausea and vomiting after your treatment, we encourage you to take your nausea medication as directed.  BELOW ARE SYMPTOMS THAT SHOULD BE REPORTED IMMEDIATELY: *FEVER GREATER THAN 100.4 F (38 C) OR HIGHER *CHILLS OR SWEATING *NAUSEA AND VOMITING THAT IS NOT CONTROLLED WITH YOUR NAUSEA MEDICATION *UNUSUAL SHORTNESS OF BREATH *UNUSUAL BRUISING OR BLEEDING *URINARY PROBLEMS (pain or burning when urinating, or frequent urination) *BOWEL PROBLEMS (unusual diarrhea, constipation, pain near the anus) TENDERNESS IN MOUTH AND THROAT WITH OR WITHOUT PRESENCE OF ULCERS (sore throat, sores in mouth, or a toothache) UNUSUAL RASH, SWELLING OR PAIN  UNUSUAL VAGINAL DISCHARGE OR ITCHING   Items with * indicate a potential emergency and should be followed up as soon as possible or go to the Emergency Department if any problems should occur.  Please show the CHEMOTHERAPY ALERT CARD or  IMMUNOTHERAPY ALERT CARD at check-in to the Emergency Department and triage nurse.  Should you have questions after your visit or need to cancel or reschedule your appointment, please contact [facility]  Dept: [phone]  and follow the prompts.  Office hours are 8:00 a.m. to 4:30 p.m. Monday - Friday. Please note that voicemails left after 4:00 p.m. may not be returned until the following business day.  We are closed weekends and major holidays. You have access to a nurse at all times for urgent questions. Please call the main number to the clinic Dept: [phone] and follow the prompts.   For any non-urgent questions, you may also contact your provider using MyChart. We now offer e-Visits for anyone 58 and older to request care online for non-urgent symptoms. For details visit [email or web address].   Also download the MyChart app! Go to the app store, search MyChart, open the app, select [facility], and log in with your MyChart username and password.

## 2024-05-11 NOTE — Assessment & Plan Note (Signed)
 Experiencing hand-foot syndrome with discoloration and pain in hands and feet, particularly affecting hands. Impacting daily activities such as writing and washing. Condition is an expected side effect of capecitabine . Grade 1. - Continue using moisturizer twice daily. - Apply Voltaren gel to affected areas, starting with feet and then hands as needed. - Consider further dose reduction of capecitabine  if symptoms worsen.

## 2024-05-11 NOTE — Assessment & Plan Note (Addendum)
 White blood cell count trending down, currently at 3000, with a neutrophil count of 1500. Within safe range to continue treatment. G-CSF booster injection may be considered if neutrophil count drops below 1000.  Will obtain prior authorization for this.

## 2024-05-11 NOTE — Progress Notes (Signed)
 [facility]  ONCOLOGY CLINIC PROGRESS NOTE   Patient Care Team: Sun, Vyvyan, MD as PCP - General (Family Medicine) Dasie Leonor CROME, MD as Consulting Physician (General Surgery) Autumn Millman, MD as Consulting Physician (Oncology) Mai Lynwood FALCON, MD as Consulting Physician (Rheumatology)  PATIENT NAME: Sherry Abbott   MR#: [id number] DOB: [birth year]  Date of visit: 05/11/2024   ASSESSMENT & PLAN:   Sherry Abbott is a 48 y.o. 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  pT3, pN1a, cM0, stage III B disease.  MMR proficient.  Side effects to 5-FU infusion with jaw clenching, muscle spasms of the tongue and generalized muscle twitching.  Adenocarcinoma of cecum [facility]) Please review oncology history for additional details and timeline of events.    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 MMR proficient.   Previously I discussed diagnosis, staging, prognosis, plan of care, treatment options.  Reviewed NCCN guidelines.  Patient had allergic reaction to FOLFOX regimen, likely to 5-FU pump. It could not be continued any longer.   Patient was agreeable to proceeding with capecitabine  and she started that from 03/29/2024.  Dose was reduced to avoid toxicity/risk of reaction.  Calculated dose is 1500 mg p.o. every morning and 1000 mg p.o. nightly for 2 weeks on, 1 week off.  Will plan to do oxaliplatin  at half-strength at a slower rate with each cycle.  She started oxaliplatin  from 03/30/2024.    She is tolerating treatment fairly well so far, except for grade 1 hand-foot syndrome.  Labs today reveal leukopenia with white count of 3000 but ANC is 1500.  No dose-limiting  toxicities.  Will proceed with oxaliplatin  today.  She will start cycle 3 of capecitabine  from today.  - She was previously referred to genetic counseling for genetic testing.  - Monitor with circulating tumor DNA test and CT scans every three months for two years.  Circulating tumor DNA test was negative on 02/06/2024. Rechecked on 05/11/2024.   -We will schedule colonoscopy one year post-surgery.  RTC in 3 weeks for cycle 4 of XELOX.  Hand foot syndrome Experiencing hand-foot syndrome with discoloration and pain in hands and feet, particularly affecting hands. Impacting daily activities such as writing and washing. Condition is an expected side effect of capecitabine . Grade 1. - Continue using moisturizer twice daily. - Apply Voltaren gel to affected areas, starting with feet and then hands as needed. - Consider further dose reduction of capecitabine  if symptoms worsen.  Leukopenia due to antineoplastic chemotherapy White blood cell count trending down, currently at 3000, with a neutrophil count of 1500. Within safe range to continue treatment. G-CSF booster injection may be considered if neutrophil count drops below 1000.  Will obtain prior authorization for this.  Chemotherapy-induced cold sensitivity Cold sensitivity is a known side effect of oxaliplatin , making it difficult to consume cold fluids. This has impacted hydration, especially in the initial days post-treatment. - Advise consumption of warm fluids to manage cold sensitivity.  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 Capecitabine  is proposed as an alternative due to its different side effect profile. - Discontinue 5-FU  due to severe allergic reaction. - Monitor for any allergic reactions to capecitabine .  I reviewed lab results and  outside records for this visit and discussed relevant results with the patient. Diagnosis, plan of care and treatment options were also discussed in detail with the patient. Opportunity provided to ask questions and answers provided to her apparent satisfaction. Provided instructions to call our clinic with any problems, questions or concerns prior to return visit. I recommended to continue follow-up with PCP and sub-specialists. She verbalized understanding and agreed with the plan.   NCCN guidelines have been consulted in the planning of this patient's care.  I spent a total of 40 minutes during this encounter with the patient including review of chart and various tests results, discussions about plan of care and coordination of care plan.   Chinita Patten, MD  05/11/2024 3:26 PM  [facility] [address] [place] [address] Dept: [phone] Dept Fax: [phone]    CHIEF COMPLAINT/ REASON FOR VISIT:   Invasive adenocarcinoma of the cecum, status post hemicolectomy on 01/19/2024.  pT3, pN1a, cM0, stage III B disease.  MMR proficient.   Current Treatment: Adjuvant systemic chemotherapy with FOLFOX started from 02/15/2024.  However she had bad reaction to presumably 5-FU.  Treatment switched to XELOX from 03/29/2024.  INTERVAL HISTORY:    Discussed the use of AI scribe software for clinical note transcription with the patient, who gave verbal consent to proceed.  History of Present Illness Sherry Abbott is a 48 year old female undergoing chemotherapy who presents with nausea and hand-foot syndrome.  She has been experiencing significant nausea over the past three weeks, which she describes as one of the worst symptoms she is currently dealing with. This nausea has been persistent and challenging to manage.  She is experiencing symptoms of hand-foot syndrome, including pain, swelling,  and peeling skin on her hands. The pain and swelling are particularly problematic, affecting her ability to perform tasks requiring fine motor skills, such as writing and washing dishes. She wears gloves to reduce friction when washing items and uses a stick to assist with bathing. Typing remains manageable as it does not require bending her hands. She has been using Voltaren gel and moisturizer to manage the symptoms, which provides only modest relief.  She is currently on her third cycle of a chemotherapy regimen that includes capecitabine , taking three pills in the morning and two at night. The dosage of capecitabine  has been reduced previously due to side effects. She also experiences tingling and shooting pains in her right hand, which is her dominant hand, and cold sensitivity following oxaliplatin  infusions, making it difficult to drink cold or room temperature fluids for several days post-infusion. This has impacted her ability to maintain adequate hydration.  Her most recent laboratory results showed a white blood cell count of 3,000 and a neutrophil count of 1,500. She is working remotely to minimize exposure to infections.  I have reviewed the past medical history, past surgical history, social history and family history with the patient and they are unchanged from previous note.  HISTORY OF PRESENT ILLNESS:   ONCOLOGY HISTORY:   Patient had routine screening colonoscopy on 01/12/2024, performed by Dr. Saintclair.  It showed a fungating, infiltrative, sessile, polypoid and ulcerated nonobstructing large mass in the cecum and in the ascending colon.  The mass was partially circumferential involving two thirds of the lumen  circumference, measuring at least 7 cm in length.  Biopsies were obtained.  3 sessile polyps were found in the transverse colon, descending colon and sigmoid colon measuring up to 6 mm, removed.  Pathology from the cecal mass came back positive for invasive moderately differentiated  adenocarcinoma.  MMR proficient.   On 01/16/2024, CT abdomen and pelvis showed 2.8 cm cyst in the left hepatic lobe, simple appearing.  No pathologic lymphadenopathy or other acute findings were noted.   She was referred for surgical oncology evaluation.  She was supposed to see Dr. Ann on 01/17/2024 but presented to the ED that same day with complaints of worsening right lower quadrant abdominal pain, nausea, vomiting and inability to tolerate oral intake.  She was admitted to the hospital for further evaluation and management.   She was seen by Dr. Dasie in the hospital and she underwent laparoscopic-assisted right hemicolectomy, excision of peritoneal cyst on 01/19/2024.  Final pathology showed invasive moderately differentiated adenocarcinoma with focal mucinous differentiation arising within a tubulovillous adenoma, measuring 6.7 cm in greatest dimension with involvement of submucosa, muscularis propria and into pericolonic adipose tissue.  1 out of 26 lymph nodes were positive for malignancy.  There was another single positive tumor deposit.  All margins were negative.  Grade 2.  No evidence of LVI or PNI.  MMR proficient.     On her consultation with us  on 01/25/2024, request placed for CT of the chest for staging.  On 02/07/2024, staging CT of the chest showed no evidence of intrathoracic metastatic disease.  pT3,pN1a, cM0, Stage III B disease.    Plan made for adjuvant chemotherapy with modified FOLFOX-6, every 2 weeks for up to 12 cycles.  Started cycle 1 on 02/15/2024.  Initially it seemed as if she has suffered an allergic reaction to oxaliplatin  with tongue muscle twitching, difficulty speaking, and jaw muscle tightness, the day after chemo.  Discussed alternatives.  Plan made to proceed with reduced dose oxaliplatin  at 50% at a slower rate over 3 hours with extra allergy medicines including Benadryl  and Pepcid .   She received cycle 2 of FOLFOX without 5-FU pump and dose reduced oxaliplatin   at a slower rate on 02/27/2024.  On 02/28/2024, she had to present to the ED again with similar complaints of tongue muscle twitching, jaw clenching etc.  Symptoms probably resolved after stopping 5-FU pump in the ED.  It is now clear that she has allergic reaction to 5-FU infusion.  We cannot use it anymore because of the severity of reaction.  Patient willing to try capecitabine  with oxaliplatin .  If she were to suffer similar side effects, then we will have to hold chemotherapy and continue close surveillance with Guardant reveal and CT imaging.  Started Capecitabine  from 03/29/2024. Oxaliplatin  started from 03/30/24.  Tolerating this regimen well so far.   Oncology History  Adenocarcinoma of cecum (HCC)  01/17/2024 Initial Diagnosis   Adenocarcinoma of cecum (HCC)   01/25/2024 Cancer Staging   Staging form: Colon and Rectum, AJCC 8th Edition - Pathologic: Stage IIIB (pT3, pN1a, cM0) - Signed by Autumn Millman, MD on 02/15/2024 Total positive nodes: 1 Histologic grading system: 4 grade system Histologic grade (G): G2 Residual tumor (R): R0   02/15/2024 - 02/29/2024 Chemotherapy   Patient is on Treatment Plan : COLORECTAL FOLFOX q14d x 3 months     03/30/2024 -  Chemotherapy   Patient is on Treatment Plan : COLORECTAL Xelox (Capeox)(130/850) q21d         REVIEW OF  SYSTEMS:   Review of Systems - Oncology  All other pertinent systems were reviewed with the patient and are negative.  ALLERGIES: She is allergic to betadine [povidone iodine], blueberry [vaccinium angustifolium], milk-related compounds, neosporin [neomycin-bacitracin zn-polymyx], povidone-iodine, shellfish allergy, sulfa antibiotics, adrucil  [fluorouracil ], neomycin, and other.  MEDICATIONS:  Current Outpatient Medications  Medication Sig Dispense Refill   B Complex Vitamins (VITAMIN B-COMPLEX) TABS Take 1 tablet by mouth daily with supper.     bisacodyl  (DULCOLAX) 5 MG EC tablet Take 5 mg by mouth daily as needed for  moderate constipation.     capecitabine  (XELODA ) 500 MG tablet Take 3 tablets (1500 mg) by mouth in the morning and 2 tablets (1000 mg) by mouth in the evening for 14 days on, followed by 7 days off. Repeat every 21 days.Take within 30 minutes after meals. 70 tablet 7   cetirizine  (ZYRTEC  ALLERGY) 10 MG tablet Take 1 tablet (10 mg total) by mouth at bedtime. 90 tablet 0   COLACE 100 MG capsule Take 200 mg by mouth in the morning and at bedtime.     dexamethasone  (DECADRON ) 4 MG tablet Take 2 tablets (8 mg total) by mouth daily. Start the day after chemotherapy for 2 days. Take with food. 30 tablet 1   diphenhydrAMINE  (BENADRYL ) 25 MG tablet Take 1 tablet (25 mg total) by mouth every 6 (six) hours as needed. 30 tablet 0   famotidine  (PEPCID ) 20 MG tablet Take 1 tablet (20 mg total) by mouth 2 (two) times daily. 30 tablet 0   fluticasone  (FLONASE ) 50 MCG/ACT nasal spray Place 2 sprays into both nostrils daily. 9.9 mL 0   folic acid  (FOLVITE ) 1 MG tablet Take 1 mg by mouth daily.     lidocaine -prilocaine  (EMLA ) cream Apply to affected area once 30 g 3   mometasone (NASONEX) 50 MCG/ACT nasal spray Place 2 sprays into the nose daily as needed (Allergies).     montelukast  (SINGULAIR ) 10 MG tablet Take 10 mg by mouth at bedtime.     Multiple Vitamin (MULTIVITAMIN WITH MINERALS) TABS tablet Take 1 tablet by mouth daily.     ondansetron  (ZOFRAN ) 8 MG tablet Take 1 tablet (8 mg total) by mouth every 8 (eight) hours as needed for nausea or vomiting. Start on the third day after chemotherapy. 30 tablet 1   oxyCODONE  (OXY IR/ROXICODONE ) 5 MG immediate release tablet Take 1 tablet (5 mg total) by mouth every 8 (eight) hours as needed for severe pain (pain score 7-10) (5 mg for pain score 4-6, 10 mg for pain score 7-10). 12 tablet 0   polyethylene glycol powder (GLYCOLAX /MIRALAX ) 17 GM/SCOOP powder Take 17 g by mouth daily as needed. 238 g 0   TYLENOL  500 MG tablet Take 500-1,000 mg by mouth every 6 (six) hours as  needed for mild pain (pain score 1-3), headache or fever.     pantoprazole  (PROTONIX ) 20 MG tablet Take 20 mg by mouth 2 (two) times daily. (Patient not taking: Reported on 05/11/2024)     prochlorperazine  (COMPAZINE ) 10 MG tablet Take 1 tablet (10 mg total) by mouth every 6 (six) hours as needed for nausea or vomiting. (Patient not taking: Reported on 05/11/2024) 30 tablet 1   Current Facility-Administered Medications  Medication Dose Route Frequency Provider Last Rate Last Admin   Fremanezumab -vfrm SOSY 225 mg  225 mg Subcutaneous Once Ahern, Antonia B, MD       Facility-Administered Medications Ordered in Other Visits  Medication Dose Route Frequency Provider Last Rate  Last Admin   dextrose  5 % solution   Intravenous Continuous Artemio Dobie, MD 10 mL/hr at 05/11/24 1007 New Bag at 05/11/24 1007   oxaliplatin  (ELOXATIN ) 115 mg in dextrose  5 % 500 mL chemo infusion  65 mg/m2 (Treatment Plan Recorded) Intravenous Once Keyasha Miah, MD 131 mL/hr at 05/11/24 1130 115 mg at 05/11/24 1130     VITALS:   Blood pressure 108/70, pulse 77, temperature 97.7 F (36.5 C), temperature source Temporal, resp. rate 16, height 5' 5 (1.651 m), weight 160 lb (72.6 kg), SpO2 100%.  Wt Readings from Last 3 Encounters:  05/11/24 160 lb (72.6 kg)  04/20/24 160 lb 3.2 oz (72.7 kg)  04/06/24 157 lb 4.8 oz (71.4 kg)    Body mass index is 26.63 kg/m.    Onc Performance Status - 05/11/24 0943       ECOG Perf Status   ECOG Perf Status Restricted in physically strenuous activity but ambulatory and able to carry out work of a light or sedentary nature, e.g., light house work, office work      KPS SCALE   KPS % SCORE Normal activity with effort, some s/s of disease           PHYSICAL EXAM:   Physical Exam Constitutional:      General: She is not in acute distress.    Appearance: Normal appearance.  HENT:     Head: Normocephalic and atraumatic.  Cardiovascular:     Rate and Rhythm: Normal rate.   Pulmonary:     Effort: Pulmonary effort is normal. No respiratory distress.  Chest:     Comments: Right-sided Port-A-Cath in place without any signs of infection Abdominal:     General: There is no distension.  Neurological:     General: No focal deficit present.     Mental Status: She is alert and oriented to person, place, and time.  Psychiatric:        Mood and Affect: Mood normal.        Behavior: Behavior normal.       LABORATORY DATA:   I have reviewed the data as listed.  Results for orders placed or performed in visit on 05/11/24  Pregnancy, urine  Result Value Ref Range   Preg Test, Ur NEGATIVE NEGATIVE  CMP (Cancer Center only)  Result Value Ref Range   Sodium 140 135 - 145 mmol/L   Potassium 3.8 3.5 - 5.1 mmol/L   Chloride 105 98 - 111 mmol/L   CO2 30 22 - 32 mmol/L   Glucose, Bld 89 70 - 99 mg/dL   BUN 15 6 - 20 mg/dL   Creatinine 9.16 9.55 - 1.00 mg/dL   Calcium  9.3 8.9 - 10.3 mg/dL   Total Protein 7.4 6.5 - 8.1 g/dL   Albumin  4.2 3.5 - 5.0 g/dL   AST 21 15 - 41 U/L   ALT 13 0 - 44 U/L   Alkaline Phosphatase 37 (L) 38 - 126 U/L   Total Bilirubin 1.4 (H) 0.0 - 1.2 mg/dL   GFR, Estimated >39 >39 mL/min   Anion gap 5 5 - 15  CBC with Differential (Cancer Center Only)  Result Value Ref Range   WBC Count 3.0 (L) 4.0 - 10.5 K/uL   RBC 3.53 (L) 3.87 - 5.11 MIL/uL   Hemoglobin 10.3 (L) 12.0 - 15.0 g/dL   HCT 70.4 (L) 63.9 - 53.9 %   MCV 83.6 80.0 - 100.0 fL   MCH 29.2 26.0 - 34.0 pg  MCHC 34.9 30.0 - 36.0 g/dL   RDW 79.7 (H) 88.4 - 84.4 %   Platelet Count 190 150 - 400 K/uL   nRBC 0.0 0.0 - 0.2 %   Neutrophils Relative % 49 %   Neutro Abs 1.5 (L) 1.7 - 7.7 K/uL   Lymphocytes Relative 35 %   Lymphs Abs 1.0 0.7 - 4.0 K/uL   Monocytes Relative 12 %   Monocytes Absolute 0.4 0.1 - 1.0 K/uL   Eosinophils Relative 3 %   Eosinophils Absolute 0.1 0.0 - 0.5 K/uL   Basophils Relative 1 %   Basophils Absolute 0.0 0.0 - 0.1 K/uL   Immature Granulocytes 0 %    Abs Immature Granulocytes 0.01 0.00 - 0.07 K/uL     RADIOGRAPHIC STUDIES:  No recent pertinent imaging available to review.   CODE STATUS:  Code Status History     Date Active Date Inactive Code Status Order ID Comments User Context   01/17/2024 0459 01/23/2024 1727 Full Code [id number]  Alfornia Madison, MD ED    Questions for Most Recent Historical Code Status (Order [id number])     Question Answer   By: Consent: discussion documented in EHR            Orders Placed This Encounter  Procedures   CBC with Differential (Cancer Center Only)    Standing Status:   Future    Expected Date:   06/22/2024    Expiration Date:   06/22/2025   CMP (Cancer Center only)    Standing Status:   Future    Expected Date:   06/22/2024    Expiration Date:   06/22/2025     Future Appointments  Date Time Provider Department Center  05/22/2024  1:00 PM [facility] PALLIATIVE CARE [facility] None  06/01/2024 11:00 AM [facility] FLUSH [facility] None  06/01/2024 11:30 AM Kiing Deakin, MD [facility] None  06/01/2024 12:00 PM [facility] INFUSION [facility] None  06/01/2024  1:00 PM Karlene Few, LCSW [facility] None  06/22/2024  9:15 AM [facility] FLUSH [facility] None  06/22/2024  9:45 AM Leslie Jester, Chinita, MD [facility] None  06/22/2024 10:45 AM [facility] INFUSION [facility] None      This document was completed utilizing speech recognition software. Grammatical errors, random word insertions, pronoun errors, and incomplete sentences are an occasional consequence of this system due to software limitations, ambient noise, and hardware issues. Any formal questions or concerns about the content, text or information contained within the body of this dictation should be directly addressed to the provider for clarification.

## 2024-05-11 NOTE — Assessment & Plan Note (Addendum)
 Please review oncology history for additional details and timeline of events.    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 MMR proficient.   Previously I discussed diagnosis, staging, prognosis, plan of care, treatment options.  Reviewed NCCN guidelines.  Patient had allergic reaction to FOLFOX regimen, likely to 5-FU pump. It could not be continued any longer.   Patient was agreeable to proceeding with capecitabine  and she started that from 03/29/2024.  Dose was reduced to avoid toxicity/risk of reaction.  Calculated dose is 1500 mg p.o. every morning and 1000 mg p.o. nightly for 2 weeks on, 1 week off.  Will plan to do oxaliplatin  at half-strength at a slower rate with each cycle.  She started oxaliplatin  from 03/30/2024.    She is tolerating treatment fairly well so far, except for grade 1 hand-foot syndrome.  Labs today reveal leukopenia with white count of 3000 but ANC is 1500.  No dose-limiting toxicities.  Will proceed with oxaliplatin  today.  She will start cycle 3 of capecitabine  from today.  - She was previously referred to genetic counseling for genetic testing.  - Monitor with circulating tumor DNA test and CT scans every three months for two years.  Circulating tumor DNA test was negative on 02/06/2024. Rechecked on 05/11/2024.   -We will schedule colonoscopy one year post-surgery.  RTC in 3 weeks for cycle 4 of XELOX.

## 2024-05-11 NOTE — Progress Notes (Signed)
 [facility] CSW Progress Note  Visual merchandiser met with patient in person during infusion. Patient receiving treatment today and hopeful blood counts will continue to be stable enough for treatment in a couple of weeks. Patient is still experiencing nausea and symptoms related to treatment, however, is motivated to get done as soon as possible. Patient attended GI support group this week and discussed it positively. Patient interested in speaking with someone who has gone through treatment, CSW will look into Peer mentors either through [employer] or [employer].   Lizbeth Sprague, LCSW Clinical Social Worker [facility]

## 2024-05-11 NOTE — Progress Notes (Signed)
 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  S/p hemicolectomy on 6/12.  Started adjuvant folfox 7/9. 5-FU discontinued due to toxicity. Patient currently receiving oxaliplatin  + capecitabine  (Xelox) q21d. Patient is under the care of Dr. Autumn.   Met with patient in infusion. She is feeling tired from premeds, but agreeable to visit. Patient reports improving constipation. Having BM 3-4 times/week on bowel regimen. Appetite is poor 3-4 days after treatment. This picks up and she eats well the remainder of the cycle. Patient complains of nausea. She is unable to take compazine  due to reaction with xeloda . Cold sensitivity is lasting 8-9 days. She is having to consume luke warm fluids which is challenging. Concerned about dehydration. Patient reports po meds melting in her mouth with warm water . Asking for suggestions on taking medications to make them more tolerable. Reports worsening lower extremity neuropathy. Unable to participate in activity (riding stationary bike/walking) as she desires. She has not been take Bcomplex regularly due to taste of pills.    Medications: reviewed   Labs: bilirubin 1.4  Anthropometrics: Wt 160 lb today - stable   9/12 - 160 lb 3.2 oz  8/21 - 156 lb 3 oz  8/5 - 151 lb 11.2 oz    NUTRITION DIAGNOSIS: Altered GI function - continues   INTERVENTION:  Continue strategies for increasing calories and protein with small frequent meals/snacks Suggested switching MVI to gummies, taking meds with applesauce. Pt may dilute xeloda  in warm water  then add small amount of juice prior to consuming per pharmacy Continue bowel regimen per MD Could try relief band for nausea  Support and encouragement   MONITORING, EVALUATION, GOAL: wt trends, intake    NEXT VISIT: Friday October 24 during infusion

## 2024-05-16 ENCOUNTER — Telehealth: Payer: Self-pay

## 2024-05-16 ENCOUNTER — Other Ambulatory Visit: Payer: Self-pay | Admitting: Oncology

## 2024-05-16 MED ORDER — OLANZAPINE 5 MG PO TABS
ORAL_TABLET | ORAL | 1 refills | Status: DC
Start: 2024-05-16 — End: 2024-05-22

## 2024-05-16 NOTE — Telephone Encounter (Signed)
 Returned [employer] message with concerns around uncontrolled nausea. Advised patient per Dr. Autumn with the following instructions:    Cut back on the Xeloda  tablets to 2 in the morning and 2 at night. If this is also not helping, she may have to cut it back further to 2 in the morning and 1 at night. If these measures don't work, we may consider referring to Symptom Management.    Patient understood and agreed with all of these instructions.

## 2024-05-21 ENCOUNTER — Encounter: Payer: Self-pay | Admitting: Oncology

## 2024-05-21 LAB — GUARDANT REVEAL

## 2024-05-22 ENCOUNTER — Encounter: Payer: Self-pay | Admitting: Nurse Practitioner

## 2024-05-22 ENCOUNTER — Inpatient Hospital Stay: Admitting: Nurse Practitioner

## 2024-05-22 VITALS — BP 118/71 | HR 80 | Temp 98.0°F | Resp 16 | Ht 65.0 in | Wt 163.8 lb

## 2024-05-22 DIAGNOSIS — R110 Nausea: Secondary | ICD-10-CM | POA: Diagnosis not present

## 2024-05-22 DIAGNOSIS — Z7189 Other specified counseling: Secondary | ICD-10-CM

## 2024-05-22 DIAGNOSIS — Z515 Encounter for palliative care: Secondary | ICD-10-CM

## 2024-05-22 DIAGNOSIS — R530 Neoplastic (malignant) related fatigue: Secondary | ICD-10-CM | POA: Diagnosis not present

## 2024-05-22 DIAGNOSIS — M792 Neuralgia and neuritis, unspecified: Secondary | ICD-10-CM

## 2024-05-22 DIAGNOSIS — C180 Malignant neoplasm of cecum: Secondary | ICD-10-CM | POA: Diagnosis not present

## 2024-05-22 MED ORDER — SCOPOLAMINE 1 MG/3DAYS TD PT72: 1.0000 | MEDICATED_PATCH | TRANSDERMAL | 12 refills | Status: AC

## 2024-05-22 MED ORDER — METOCLOPRAMIDE HCL 10 MG PO TABS: 10.0000 mg | ORAL_TABLET | Freq: Four times a day (QID) | ORAL | 0 refills | Status: DC

## 2024-05-23 ENCOUNTER — Other Ambulatory Visit (HOSPITAL_COMMUNITY): Payer: Self-pay

## 2024-05-23 NOTE — Progress Notes (Unsigned)
 Palliative Medicine [facility]  [phone] [phone]   Name: [name] Date: 05/23/2024 MRN: [id number]  DOB: 09-21-1975  Patient Care Team: Sun, Vyvyan, MD as PCP - General (Family Medicine) Dasie Leonor CROME, MD as Consulting Physician (General Surgery) Autumn Millman, MD as Consulting Physician (Oncology) Mai Lynwood FALCON, MD as Consulting Physician (Rheumatology)    REASON FOR CONSULTATION: Sherry Abbott is a 48 y.o. 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  Palliative is seeing patient for symptom management and goals of care.    SOCIAL HISTORY:     reports that she has never smoked. She has never used smokeless tobacco. She reports that she does not drink alcohol and does not use drugs.  ADVANCE DIRECTIVES:  None on file   CODE STATUS: Full code  PAST MEDICAL HISTORY: Past Medical History:  Diagnosis Date   Allergic rhinitis    Allergic to food    blueberry   Allergic to shellfish    Asthma    Cancer (HCC)    Dermatographic urticaria    Hidradenitis suppurativa    Labral tear of right hip joint    surgery   Low back pain    Nausea and vomiting 01/17/2024   Other chronic allergic conjunctivitis    Polyarthralgia     PAST SURGICAL HISTORY:  Past Surgical History:  Procedure Laterality Date   CESAREAN SECTION  2008   DERMOID CYST  EXCISION Right 03/2022   right arm in dermatologist office   LABRAL REPAIR Right 04/2021   right hip   LAPAROSCOPIC PARTIAL COLECTOMY Right 01/19/2024   Procedure: LAPAROSCOPIC PARTIAL COLECTOMY;  Surgeon: Dasie Leonor CROME, MD;  Location: [facility] OR;  Service: General;  Laterality: Right;  LAPAROSCOPIC ASSISTED RIGHT HEMI COLECTOMY   PORTACATH PLACEMENT N/A 02/03/2024   Procedure: INSERTION PORTACATH RIGHT SUBCLAVIAN;   Surgeon: Dasie Leonor CROME, MD;  Location: [facility] OR;  Service: General;  Laterality: N/A;   ROTATOR CUFF REPAIR Right    2015    HEMATOLOGY/ONCOLOGY HISTORY:  Oncology History  Adenocarcinoma of cecum (HCC)  01/17/2024 Initial Diagnosis   Adenocarcinoma of cecum (HCC)   01/25/2024 Cancer Staging   Staging form: Colon and Rectum, AJCC 8th Edition - Pathologic: Stage IIIB (pT3, pN1a, cM0) - Signed by Autumn Millman, MD on 02/15/2024 Total positive nodes: 1 Histologic grading system: 4 grade system Histologic grade (G): G2 Residual tumor (R): R0   02/15/2024 - 02/29/2024 Chemotherapy   Patient is on Treatment Plan : COLORECTAL FOLFOX q14d x 3 months     03/30/2024 -  Chemotherapy   Patient is on Treatment Plan : COLORECTAL Xelox (Capeox)(130/850) q21d       ALLERGIES:  is allergic to betadine [povidone iodine], blueberry [vaccinium angustifolium], milk-related compounds, neosporin [neomycin-bacitracin zn-polymyx], povidone-iodine, shellfish allergy, sulfa antibiotics, adrucil  [fluorouracil ], neomycin, and other.  MEDICATIONS:  Current Outpatient Medications  Medication Sig Dispense Refill   scopolamine (TRANSDERM-SCOP) 1 MG/3DAYS Place 1 patch (1 mg total) onto the skin every 3 (three) days. 10 patch 12   B Complex Vitamins (VITAMIN B-COMPLEX) TABS Take 1 tablet by mouth daily with supper.     bisacodyl  (DULCOLAX) 5 MG EC tablet Take 5 mg by mouth daily as needed for moderate constipation.     capecitabine  (XELODA ) 500 MG tablet Take 3 tablets (1500 mg) by mouth in the morning and 2 tablets (1000 mg)  by mouth in the evening for 14 days on, followed by 7 days off. Repeat every 21 days.Take within 30 minutes after meals. 70 tablet 7   cetirizine  (ZYRTEC  ALLERGY) 10 MG tablet Take 1 tablet (10 mg total) by mouth at bedtime. 90 tablet 0   COLACE 100 MG capsule Take 200 mg by mouth in the morning and at bedtime.     dexamethasone  (DECADRON ) 4 MG tablet Take 2 tablets (8 mg total) by mouth daily.  Start the day after chemotherapy for 2 days. Take with food. 30 tablet 1   diphenhydrAMINE  (BENADRYL ) 25 MG tablet Take 1 tablet (25 mg total) by mouth every 6 (six) hours as needed. 30 tablet 0   famotidine  (PEPCID ) 20 MG tablet Take 1 tablet (20 mg total) by mouth 2 (two) times daily. 30 tablet 0   fluticasone  (FLONASE ) 50 MCG/ACT nasal spray Place 2 sprays into both nostrils daily. 9.9 mL 0   folic acid  (FOLVITE ) 1 MG tablet Take 1 mg by mouth daily.     lidocaine -prilocaine  (EMLA ) cream Apply to affected area once 30 g 3   mometasone (NASONEX) 50 MCG/ACT nasal spray Place 2 sprays into the nose daily as needed (Allergies).     montelukast  (SINGULAIR ) 10 MG tablet Take 10 mg by mouth at bedtime.     Multiple Vitamin (MULTIVITAMIN WITH MINERALS) TABS tablet Take 1 tablet by mouth daily.     ondansetron  (ZOFRAN ) 8 MG tablet Take 1 tablet (8 mg total) by mouth every 8 (eight) hours as needed for nausea or vomiting. Start on the third day after chemotherapy. 30 tablet 1   oxyCODONE  (OXY IR/ROXICODONE ) 5 MG immediate release tablet Take 1 tablet (5 mg total) by mouth every 8 (eight) hours as needed for severe pain (pain score 7-10) (5 mg for pain score 4-6, 10 mg for pain score 7-10). 12 tablet 0   polyethylene glycol powder (GLYCOLAX /MIRALAX ) 17 GM/SCOOP powder Take 17 g by mouth daily as needed. 238 g 0   TYLENOL  500 MG tablet Take 500-1,000 mg by mouth every 6 (six) hours as needed for mild pain (pain score 1-3), headache or fever.     Current Facility-Administered Medications  Medication Dose Route Frequency Provider Last Rate Last Admin   Fremanezumab -vfrm SOSY 225 mg  225 mg Subcutaneous Once Ines Onetha NOVAK, MD        VITAL SIGNS: BP 118/71 (BP Location: Left Arm, Patient Position: Sitting)   Pulse 80   Temp 98 F (36.7 C) (Temporal)   Resp 16   Ht 5' 5 (1.651 m)   Wt 163 lb 12.8 oz (74.3 kg)   SpO2 100%   BMI 27.26 kg/m  Filed Weights   05/22/24 1319  Weight: 163 lb 12.8 oz  (74.3 kg)    Estimated body mass index is 27.26 kg/m as calculated from the following:   Height as of this encounter: 5' 5 (1.651 m).   Weight as of this encounter: 163 lb 12.8 oz (74.3 kg).  LABS: CBC:    Component Value Date/Time   WBC 3.0 (L) 05/11/2024 0841   WBC 2.7 (L) 03/09/2024 0235   HGB 10.3 (L) 05/11/2024 0841   HCT 29.5 (L) 05/11/2024 0841   PLT 190 05/11/2024 0841   MCV 83.6 05/11/2024 0841   NEUTROABS 1.5 (L) 05/11/2024 0841   LYMPHSABS 1.0 05/11/2024 0841   MONOABS 0.4 05/11/2024 0841   EOSABS 0.1 05/11/2024 0841   BASOSABS 0.0 05/11/2024 0841   Comprehensive Metabolic Panel:  Component Value Date/Time   NA 140 05/11/2024 0841   K 3.8 05/11/2024 0841   CL 105 05/11/2024 0841   CO2 30 05/11/2024 0841   BUN 15 05/11/2024 0841   CREATININE 0.83 05/11/2024 0841   GLUCOSE 89 05/11/2024 0841   CALCIUM  9.3 05/11/2024 0841   AST 21 05/11/2024 0841   ALT 13 05/11/2024 0841   ALKPHOS 37 (L) 05/11/2024 0841   BILITOT 1.4 (H) 05/11/2024 0841   PROT 7.4 05/11/2024 0841   ALBUMIN  4.2 05/11/2024 0841    RADIOGRAPHIC STUDIES: No results found.  PERFORMANCE STATUS (ECOG) : 1 - Symptomatic but completely ambulatory  Review of Systems  Constitutional:  Positive for fatigue.  Gastrointestinal:  Positive for nausea.  Skin:  Positive for color change.  Unless otherwise noted, a complete review of systems is negative.  Physical Exam General: NAD Cardiovascular: regular rate and rhythm Pulmonary: clear ant fields Abdomen: soft, nontender, + bowel sounds Extremities: no edema, no joint deformities Skin: no rashes, hand foot related to treatment Neurological: Alert and oriented x3  Impression:  Sherry Abbott presents to clinic for her initial palliative visit. No family present. No acute distress. Patient is alert and able to engage appropriately in discussions.   I introduced myself, [place]gan RN, and Palliative's role in collaboration with the oncology team.  Concept of Palliative Care was introduced as specialized medical care for people and their families living with serious illness.  It focuses on providing relief from the symptoms and stress of a serious illness.  The goal is to improve quality of life for both the patient and the family. Values and goals of care important to patient and family were attempted to be elicited.  Mrs. Mataya lives at home with family. Patient works from home remotely. She is able to perform some ADLs independently however with some limitations due to fatigue and neuropathic symptoms.   Gustava shares persistent nausea despite use of Zofran . Unfortunately she had what seemed to be some level of dystonia tardive dyskinsia after use of compazine  in addition with her 5FU treatment. She reports involuntary muscle and tongue twitching and spasms. She has ongoing nausea not controlled with use of Zofran . She contributes decreased appetite due to ongoing nausea. Current weight is 163lbs. Education provided on use of scopolamine patch in addition to her Zofran  for nausea. I discussed administration, efficacy, and potential side effects.    She also has ongoing fatigue. We discussed listening to her body and taking frequent rest breaks.   Mrs. Fors speaks to significant concerns of hand foot syndrome related to medication (Revlimid). She is managing this appropriately with use of udder cream. Pigmentation changes noted. In addition to this patient endorses constant pain describing as needles sticking in her hands and feet. At times her hands will feel tight and when she attempts to make a fist it is painful and tight. We discussed limitations in use of antidepressants or SNRIs such as Cymbalta due to previous reactions with compazine  and 5FU. She is currently using voltaren topical cream for discomfort with minimal to no relief. Education provided on use of Nervive cream in addition to CBD ointment. She verbalized understanding and  appreciation.   We discussed Mrs. Casco current illness and what it means in the larger context of her on-going co-morbidities. Natural disease trajectory and expectations were discussed.  Patient is realistic in her understanding of overall health and plan of care. She is clear in her expressed wishes to continue to treat the treatable  allowing her every opportunity to thrive with hopes of improving symptoms and her quality of life.   I discussed the importance of continued conversation with family and their medical providers regarding overall plan of care and treatment options, ensuring decisions are within the context of the patients values and GOCs. Assessment & Plan Established therapeutic relationship. Education provided on palliative's role in collaboration with their Oncology/Radiation team.  Nausea Persistent nausea not controlled with Zofran . Limited to medication use out of concern of previous 5FU and compazine  reaction. Acknowledges nausea was better managed with compazine . Education provided on use of scopolamine patch.  -Continue Zofran  as prescribed.  -Scopolamine patch every 72 hours.   Fatigue related to treatment Continue to remain as active as possible.  -Nap and take breaks when needed  Neuropathic Pain Endorses feelings of needles, tightness, and pain in hands and feet limiting ability to perform ADLs including writing at times.  Garnette topical cream -CBD topical cream -Oxycodone  as needed for pain.   I will plan to see patient back in 2-3 weeks. Sooner if needed.   Patient expressed understanding and was in agreement with this plan. She also understands that She can call the clinic at any time with any questions, concerns, or complaints.   Thank you for your referral and allowing Palliative to assist in Mrs. [facility] care.   Number and complexity of problems addressed: HIGH - 1 or more chronic illnesses with SEVERE exacerbation, progression,  or side effects of treatment - advanced cancer, pain. Any controlled substances utilized were prescribed in the context of palliative care.  Visit consisted of counseling and education dealing with the complex and emotionally intense issues of symptom management and palliative care in the setting of serious and potentially life-threatening illness.  Signed by: Levon Borer, AGPCNP-BC Palliative Medicine Team/[facility]

## 2024-05-24 ENCOUNTER — Encounter: Payer: Self-pay | Admitting: Oncology

## 2024-05-25 ENCOUNTER — Other Ambulatory Visit: Payer: Self-pay

## 2024-05-25 NOTE — Progress Notes (Signed)
 Specialty Pharmacy Refill Coordination Note  Sherry Abbott is a 48 y.o. female contacted today regarding refills of specialty medication(s) Capecitabine  (XELODA )   Patient requested Delivery   Delivery date: 05/29/24   Verified address: [address]  [place] [address]   Medication will be filled on 05/28/24.

## 2024-05-28 ENCOUNTER — Other Ambulatory Visit: Payer: Self-pay

## 2024-05-28 NOTE — Progress Notes (Signed)
 Clinical Intervention Note  Clinical Intervention Notes: Patient reported that Xeloda  dose was reduced to 2 tablets BID with instructions to decrease further to 2 tablets in the morning and 1 in the evening if nausea and hand-foot syndrome continues to worsen. Confirmed these directions with last OV on 9/12. Patient also reported starting scopalamine and Nervive. No DDIs identified with either medication and Xeloda .   Clinical Intervention Outcomes: Improved therapy adherence; Prevention of an adverse drug event   Advertising account planner

## 2024-05-31 ENCOUNTER — Encounter: Payer: Self-pay | Admitting: Oncology

## 2024-05-31 ENCOUNTER — Telehealth: Payer: Self-pay

## 2024-05-31 NOTE — Telephone Encounter (Signed)
 Patient called with the following concern:  Patient is having treatment tomorrow. She reports that she is supposed to start Xeloda  on day of tx. However, if her Neutrophils are low after lab work, she believes she is not supposed to take the Xeloda .  Dr. Autumn advised to hold off taking the Xeloda  until after her labs result and she is given instructions to take this medication.   Patient understood with all instructions and agreed.

## 2024-06-01 ENCOUNTER — Inpatient Hospital Stay

## 2024-06-01 ENCOUNTER — Inpatient Hospital Stay: Admitting: Dietician

## 2024-06-01 ENCOUNTER — Inpatient Hospital Stay (HOSPITAL_BASED_OUTPATIENT_CLINIC_OR_DEPARTMENT_OTHER): Admitting: Oncology

## 2024-06-01 ENCOUNTER — Encounter: Payer: Self-pay | Admitting: Oncology

## 2024-06-01 VITALS — BP 108/60 | HR 99 | Temp 98.7°F | Resp 18 | Ht 65.0 in | Wt 164.4 lb

## 2024-06-01 DIAGNOSIS — C180 Malignant neoplasm of cecum: Secondary | ICD-10-CM

## 2024-06-01 LAB — CMP (CANCER CENTER ONLY)
ALT: 15 U/L (ref 0–44)
AST: 25 U/L (ref 15–41)
Albumin: 4.1 g/dL (ref 3.5–5.0)
Alkaline Phosphatase: 38 U/L (ref 38–126)
Anion gap: 5 (ref 5–15)
BUN: 12 mg/dL (ref 6–20)
CO2: 30 mmol/L (ref 22–32)
Calcium: 9.1 mg/dL (ref 8.9–10.3)
Chloride: 107 mmol/L (ref 98–111)
Creatinine: 0.94 mg/dL (ref 0.44–1.00)
GFR, Estimated: >60 mL/min (ref >60)
Glucose, Bld: 110 mg/dL — ABNORMAL HIGH (ref 70–99)
Potassium: 3.9 mmol/L (ref 3.5–5.1)
Sodium: 142 mmol/L (ref 135–145)
Total Bilirubin: 0.9 mg/dL (ref 0.0–1.2)
Total Protein: 7.2 g/dL (ref 6.5–8.1)

## 2024-06-01 LAB — CBC WITH DIFFERENTIAL (CANCER CENTER ONLY)
Abs Immature Granulocytes: 0.01 K/uL (ref 0.00–0.07)
Basophils Absolute: 0 K/uL (ref 0.0–0.1)
Basophils Relative: 1 %
Eosinophils Absolute: 0.1 K/uL (ref 0.0–0.5)
Eosinophils Relative: 4 %
HCT: 30.8 % — ABNORMAL LOW (ref 36.0–46.0)
Hemoglobin: 10.6 g/dL — ABNORMAL LOW (ref 12.0–15.0)
Immature Granulocytes: 0 %
Lymphocytes Relative: 39 %
Lymphs Abs: 1.2 K/uL (ref 0.7–4.0)
MCH: 29.4 pg (ref 26.0–34.0)
MCHC: 34.4 g/dL (ref 30.0–36.0)
MCV: 85.6 fL (ref 80.0–100.0)
Monocytes Absolute: 0.3 K/uL (ref 0.1–1.0)
Monocytes Relative: 10 %
Neutro Abs: 1.5 K/uL — ABNORMAL LOW (ref 1.7–7.7)
Neutrophils Relative %: 46 %
Platelet Count: 164 K/uL (ref 150–400)
RBC: 3.6 MIL/uL — ABNORMAL LOW (ref 3.87–5.11)
RDW: 21.5 % — ABNORMAL HIGH (ref 11.5–15.5)
WBC Count: 3.1 K/uL — ABNORMAL LOW (ref 4.0–10.5)
nRBC: 0 % (ref 0.0–0.2)

## 2024-06-01 LAB — PREGNANCY, URINE: Preg Test, Ur: NEGATIVE

## 2024-06-01 MED ORDER — DEXAMETHASONE SOD PHOSPHATE PF 10 MG/ML IJ SOLN
10.0000 mg | Freq: Once | INTRAMUSCULAR | Status: AC
  Administered 2024-06-01: 10 mg via INTRAVENOUS

## 2024-06-01 MED ORDER — DEXTROSE 5 % IV SOLN: INTRAVENOUS | Status: DC

## 2024-06-01 MED ORDER — PALONOSETRON HCL INJECTION 0.25 MG/5ML
0.2500 mg | Freq: Once | INTRAVENOUS | Status: AC
  Administered 2024-06-01: 0.25 mg via INTRAVENOUS
  Filled 2024-06-01: qty 5

## 2024-06-01 MED ORDER — OXALIPLATIN CHEMO INJECTION 100 MG/20ML
65.0000 mg/m2 | Freq: Once | INTRAVENOUS | Status: AC
  Administered 2024-06-01: 115 mg via INTRAVENOUS
  Filled 2024-06-01: qty 3

## 2024-06-01 MED ORDER — DIPHENHYDRAMINE HCL 50 MG/ML IJ SOLN
25.0000 mg | Freq: Once | INTRAMUSCULAR | Status: AC
  Administered 2024-06-01: 25 mg via INTRAVENOUS
  Filled 2024-06-01: qty 1

## 2024-06-01 MED ORDER — FAMOTIDINE IN NACL 20-0.9 MG/50ML-% IV SOLN
20.0000 mg | Freq: Once | INTRAVENOUS | Status: AC
  Administered 2024-06-01: 20 mg via INTRAVENOUS
  Filled 2024-06-01: qty 50

## 2024-06-01 MED ORDER — SODIUM CHLORIDE 0.9% FLUSH: 10.0000 mL | INTRAVENOUS | Status: DC | PRN

## 2024-06-01 NOTE — Progress Notes (Signed)
 [facility]  ONCOLOGY CLINIC PROGRESS NOTE   Patient Care Team: Sun, Vyvyan, MD as PCP - General (Family Medicine) Dasie Leonor CROME, MD as Consulting Physician (General Surgery) Autumn Millman, MD as Consulting Physician (Oncology) Mai Lynwood FALCON, MD as Consulting Physician (Rheumatology) Pickenpack-Cousar, Fannie SAILOR, NP as Nurse Practitioner (Hospice and Palliative Medicine) Silvano Valrie SQUIBB, RN as Registered Nurse  PATIENT NAME: Sherry Abbott   MR#: [id number] DOB: 1975-09-11  Date of visit: 06/01/2024   ASSESSMENT & PLAN:   Sherry Abbott is a 48 y.o. 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  pT3, pN1a, cM0, stage III B disease.  MMR proficient.  Side effects to 5-FU infusion with jaw clenching, muscle spasms of the tongue and generalized muscle twitching.  Adenocarcinoma of cecum [facility]) Please review oncology history for additional details and timeline of events.    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 MMR proficient.   Previously I discussed diagnosis, staging, prognosis, plan of care, treatment options.  Reviewed NCCN guidelines.  Patient had allergic reaction to FOLFOX regimen, likely to 5-FU pump. It could not be continued any longer.   Patient was agreeable to proceeding with capecitabine  and she started that from 03/29/2024.  Dose was reduced to avoid toxicity/risk of reaction.  Calculated dose is 1500 mg p.o. every morning and 1000 mg p.o. nightly for 2 weeks on, 1 week off.  Will plan to do oxaliplatin  at half-strength at a slower rate with each cycle.  She started oxaliplatin  from 03/30/2024.    During cycle 3 of CapeOx, because of  progressive side effects including nausea, Xeloda  dose was decreased to 1000 mg twice daily.  She is tolerating this better.  She is tolerating treatment fairly well so far, except for grade 1 hand-foot syndrome.  Labs today reveal leukopenia with white count of 3100 but ANC is 1500.  No dose-limiting toxicities.  Will proceed with oxaliplatin  today.  She will start cycle 4 of capecitabine  from today.  - She was previously referred to genetic counseling for genetic testing.  - Monitor with circulating tumor DNA test and CT scans every three months for two years.  Circulating tumor DNA test was negative on 02/06/2024 and on 05/11/2024.   -We will schedule colonoscopy one year post-surgery.  RTC in 3 weeks for cycle 5 of XELOX.  Will obtain restaging CT scan after next visit.  To be ordered on future visit.   Chemotherapy-induced nausea and vomiting Nausea managed with scopolamine patch, which has been effective. She experiences difficulty with fluid intake post-treatment, particularly with cold beverages, leading to dehydration and dry heaving. She plans to come in for IV fluids if needed for hydration. - Continue scopolamine patch for nausea management. - Arrange for IV fluids on Monday or Tuesday if needed for hydration.  Chemotherapy-induced hand-foot syndrome Symptoms of hand-foot syndrome improve during the washout week. Currently, hands and feet are better compared to the past two weeks. She is using creams and moisturizers as part of management. - Continue use of creams and moisturizers for hand-foot syndrome management.  Chemotherapy-induced cold sensitivity and neuropathy Cold sensitivity persists, requiring warm fluids post-treatment. Tingling and numbness in hands and feet expected to take longer to resolve post-treatment. - Advise  continuation of warm fluid intake to manage cold sensitivity. - Educate that neuropathy may take 3-4 weeks post-treatment to improve, with full resolution  taking longer.  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 Capecitabine  is proposed as an alternative due to its different side effect profile. - Discontinue 5-FU due to severe allergic reaction. - Monitor for any allergic reactions to capecitabine .  I reviewed lab results and outside records for this visit and discussed relevant results with the patient. Diagnosis, plan of care and treatment options were also discussed in detail with the patient. Opportunity provided to ask questions and answers provided to her apparent satisfaction. Provided instructions to call our clinic with any problems, questions or concerns prior to return visit. I recommended to continue follow-up with PCP and sub-specialists. She verbalized understanding and agreed with the plan.   NCCN guidelines have been consulted in the planning of this patient's care.  I spent a total of 40 minutes during this encounter with the patient including review of chart and various tests results, discussions about plan of care and coordination of care plan.   Chinita Patten, MD  06/01/2024 6:43 PM  [facility] - A DEPT OF [facility] [address] [place] [address] Dept: [phone] Dept Fax: [phone]    CHIEF COMPLAINT/ REASON FOR VISIT:   Invasive adenocarcinoma of the cecum, status post hemicolectomy on 01/19/2024.  pT3, pN1a, cM0, stage III B disease.  MMR proficient.   Current Treatment: Adjuvant systemic chemotherapy with FOLFOX started from 02/15/2024.  However she had bad reaction to presumably 5-FU.  Treatment switched to XELOX from 03/29/2024.  INTERVAL HISTORY:    Discussed the use of AI scribe software for clinical note transcription with the patient, who  gave verbal consent to proceed.  History of Present Illness Sherry Abbott is a 48 year old female undergoing chemotherapy for cancer who presents for follow-up and management of treatment side effects.  She is currently undergoing chemotherapy with Xeloda , initially taking three tablets in the morning and two in the evening. Due to side effects, the dosage was reduced to two tablets twice daily one week into her last cycle. Her blood counts are stable, with a white count of 3,100 and hemoglobin at 10.6, slightly improved from 10.3 previously. Platelets are normal.  She experiences nausea as a side effect of the chemotherapy and uses a scopolamine patch, which helps manage the nausea. She also experiences hand-foot syndrome, which improves during the washout week. Her hands and feet feel better today compared to the past two weeks. She uses creams and moisturizers to manage these symptoms.  She reports occasional gum bleeding without sores, occurring a couple of times a week. She can taste the blood when it happens, but it is not frequent. She has not experienced this issue before.  Her eating is affected post-treatment, with difficulty eating in the first few days but improving by day five or six. She struggles with drinking cold liquids and can only tolerate warm beverages, which makes it challenging to maintain adequate hydration. She describes severe nausea and dry heaving, particularly in the days following treatment, which has been her worst experience so far.  Her guardant reveal test for circulating tumor DNA was negative in June and October.  She finds work to  be a good distraction from her treatment, keeping her busy.  I have reviewed the past medical history, past surgical history, social history and family history with the patient and they are unchanged from previous note.  HISTORY OF PRESENT ILLNESS:   ONCOLOGY HISTORY:   Patient had routine screening colonoscopy on  01/12/2024, performed by Dr. Saintclair.  It showed a fungating, infiltrative, sessile, polypoid and ulcerated nonobstructing large mass in the cecum and in the ascending colon.  The mass was partially circumferential involving two thirds of the lumen circumference, measuring at least 7 cm in length.  Biopsies were obtained.  3 sessile polyps were found in the transverse colon, descending colon and sigmoid colon measuring up to 6 mm, removed.  Pathology from the cecal mass came back positive for invasive moderately differentiated adenocarcinoma.  MMR proficient.   On 01/16/2024, CT abdomen and pelvis showed 2.8 cm cyst in the left hepatic lobe, simple appearing.  No pathologic lymphadenopathy or other acute findings were noted.   She was referred for surgical oncology evaluation.  She was supposed to see Dr. Ann on 01/17/2024 but presented to the ED that same day with complaints of worsening right lower quadrant abdominal pain, nausea, vomiting and inability to tolerate oral intake.  She was admitted to the hospital for further evaluation and management.   She was seen by Dr. Dasie in the hospital and she underwent laparoscopic-assisted right hemicolectomy, excision of peritoneal cyst on 01/19/2024.  Final pathology showed invasive moderately differentiated adenocarcinoma with focal mucinous differentiation arising within a tubulovillous adenoma, measuring 6.7 cm in greatest dimension with involvement of submucosa, muscularis propria and into pericolonic adipose tissue.  1 out of 26 lymph nodes were positive for malignancy.  There was another single positive tumor deposit.  All margins were negative.  Grade 2.  No evidence of LVI or PNI.  MMR proficient.     On her consultation with us  on 01/25/2024, request placed for CT of the chest for staging.  On 02/07/2024, staging CT of the chest showed no evidence of intrathoracic metastatic disease.  pT3,pN1a, cM0, Stage III B disease.    Plan made for adjuvant  chemotherapy with modified FOLFOX-6, every 2 weeks for up to 12 cycles.  Started cycle 1 on 02/15/2024.  Initially it seemed as if she has suffered an allergic reaction to oxaliplatin  with tongue muscle twitching, difficulty speaking, and jaw muscle tightness, the day after chemo.  Discussed alternatives.  Plan made to proceed with reduced dose oxaliplatin  at 50% at a slower rate over 3 hours with extra allergy medicines including Benadryl  and Pepcid .   She received cycle 2 of FOLFOX without 5-FU pump and dose reduced oxaliplatin  at a slower rate on 02/27/2024.  On 02/28/2024, she had to present to the ED again with similar complaints of tongue muscle twitching, jaw clenching etc.  Symptoms probably resolved after stopping 5-FU pump in the ED.  It is now clear that she has allergic reaction to 5-FU infusion.  We cannot use it anymore because of the severity of reaction.  Patient willing to try capecitabine  with oxaliplatin .  If she were to suffer similar side effects, then we will have to hold chemotherapy and continue close surveillance with Guardant reveal and CT imaging.  Started Capecitabine  from 03/29/2024. Oxaliplatin  started from 03/30/24.  Tolerating this regimen well so far.   Oncology History  Adenocarcinoma of cecum (HCC)  01/17/2024 Initial Diagnosis   Adenocarcinoma of cecum (HCC)   01/25/2024 Cancer Staging  Staging form: Colon and Rectum, AJCC 8th Edition - Pathologic: Stage IIIB (pT3, pN1a, cM0) - Signed by Autumn Millman, MD on 02/15/2024 Total positive nodes: 1 Histologic grading system: 4 grade system Histologic grade (G): G2 Residual tumor (R): R0   02/15/2024 - 02/29/2024 Chemotherapy   Patient is on Treatment Plan : COLORECTAL FOLFOX q14d x 3 months     03/30/2024 -  Chemotherapy   Patient is on Treatment Plan : COLORECTAL Xelox (Capeox)(130/850) q21d         REVIEW OF SYSTEMS:   Review of Systems - Oncology  All other pertinent systems were reviewed with the patient  and are negative.  ALLERGIES: She is allergic to betadine [povidone iodine], blueberry [vaccinium angustifolium], milk-related compounds, neosporin [neomycin-bacitracin zn-polymyx], povidone-iodine, shellfish allergy, sulfa antibiotics, adrucil  [fluorouracil ], neomycin, and other.  MEDICATIONS:  Current Outpatient Medications  Medication Sig Dispense Refill   B Complex Vitamins (VITAMIN B-COMPLEX) TABS Take 1 tablet by mouth daily with supper.     bisacodyl  (DULCOLAX) 5 MG EC tablet Take 5 mg by mouth daily as needed for moderate constipation.     capecitabine  (XELODA ) 500 MG tablet Take 3 tablets (1500 mg) by mouth in the morning and 2 tablets (1000 mg) by mouth in the evening for 14 days on, followed by 7 days off. Repeat every 21 days.Take within 30 minutes after meals. 70 tablet 7   cetirizine  (ZYRTEC  ALLERGY) 10 MG tablet Take 1 tablet (10 mg total) by mouth at bedtime. 90 tablet 0   COLACE 100 MG capsule Take 200 mg by mouth in the morning and at bedtime.     dexamethasone  (DECADRON ) 4 MG tablet Take 2 tablets (8 mg total) by mouth daily. Start the day after chemotherapy for 2 days. Take with food. 30 tablet 1   diphenhydrAMINE  (BENADRYL ) 25 MG tablet Take 1 tablet (25 mg total) by mouth every 6 (six) hours as needed. 30 tablet 0   famotidine  (PEPCID ) 20 MG tablet Take 1 tablet (20 mg total) by mouth 2 (two) times daily. 30 tablet 0   fluticasone  (FLONASE ) 50 MCG/ACT nasal spray Place 2 sprays into both nostrils daily. 9.9 mL 0   folic acid  (FOLVITE ) 1 MG tablet Take 1 mg by mouth daily.     lidocaine -prilocaine  (EMLA ) cream Apply to affected area once 30 g 3   mometasone (NASONEX) 50 MCG/ACT nasal spray Place 2 sprays into the nose daily as needed (Allergies).     montelukast  (SINGULAIR ) 10 MG tablet Take 10 mg by mouth at bedtime.     Multiple Vitamin (MULTIVITAMIN WITH MINERALS) TABS tablet Take 1 tablet by mouth daily.     ondansetron  (ZOFRAN ) 8 MG tablet Take 1 tablet (8 mg total) by  mouth every 8 (eight) hours as needed for nausea or vomiting. Start on the third day after chemotherapy. 30 tablet 1   oxyCODONE  (OXY IR/ROXICODONE ) 5 MG immediate release tablet Take 1 tablet (5 mg total) by mouth every 8 (eight) hours as needed for severe pain (pain score 7-10) (5 mg for pain score 4-6, 10 mg for pain score 7-10). 12 tablet 0   polyethylene glycol powder (GLYCOLAX /MIRALAX ) 17 GM/SCOOP powder Take 17 g by mouth daily as needed. 238 g 0   scopolamine (TRANSDERM-SCOP) 1 MG/3DAYS Place 1 patch (1 mg total) onto the skin every 3 (three) days. 10 patch 12   TYLENOL  500 MG tablet Take 500-1,000 mg by mouth every 6 (six) hours as needed for mild pain (pain score 1-3),  headache or fever.     Current Facility-Administered Medications  Medication Dose Route Frequency Provider Last Rate Last Admin   Fremanezumab -vfrm SOSY 225 mg  225 mg Subcutaneous Once Ahern, Antonia B, MD       Facility-Administered Medications Ordered in Other Visits  Medication Dose Route Frequency Provider Last Rate Last Admin   dextrose  5 % solution   Intravenous Continuous Jaymir Struble, MD   Stopped at 06/01/24 1807   sodium chloride  flush (NS) 0.9 % injection 10 mL  10 mL Intracatheter PRN Magalie Almon, MD         VITALS:   Blood pressure 108/60, pulse 99, temperature 98.7 F (37.1 C), temperature source Temporal, resp. rate 18, height 5' 5 (1.651 m), weight 164 lb 6.4 oz (74.6 kg), SpO2 95%.  Wt Readings from Last 3 Encounters:  06/01/24 164 lb 6.4 oz (74.6 kg)  05/22/24 163 lb 12.8 oz (74.3 kg)  05/11/24 160 lb (72.6 kg)    Body mass index is 27.36 kg/m.       PHYSICAL EXAM:   Physical Exam Constitutional:      General: She is not in acute distress.    Appearance: Normal appearance.  HENT:     Head: Normocephalic and atraumatic.  Cardiovascular:     Rate and Rhythm: Normal rate.  Pulmonary:     Effort: Pulmonary effort is normal. No respiratory distress.  Chest:     Comments:  Right-sided Port-A-Cath in place without any signs of infection Abdominal:     General: There is no distension.  Neurological:     General: No focal deficit present.     Mental Status: She is alert and oriented to person, place, and time.  Psychiatric:        Mood and Affect: Mood normal.        Behavior: Behavior normal.       LABORATORY DATA:   I have reviewed the data as listed.  Results for orders placed or performed in visit on 06/01/24  CMP (Cancer Center only)  Result Value Ref Range   Sodium 142 135 - 145 mmol/L   Potassium 3.9 3.5 - 5.1 mmol/L   Chloride 107 98 - 111 mmol/L   CO2 30 22 - 32 mmol/L   Glucose, Bld 110 (H) 70 - 99 mg/dL   BUN 12 6 - 20 mg/dL   Creatinine 9.05 9.55 - 1.00 mg/dL   Calcium  9.1 8.9 - 10.3 mg/dL   Total Protein 7.2 6.5 - 8.1 g/dL   Albumin  4.1 3.5 - 5.0 g/dL   AST 25 15 - 41 U/L   ALT 15 0 - 44 U/L   Alkaline Phosphatase 38 38 - 126 U/L   Total Bilirubin 0.9 0.0 - 1.2 mg/dL   GFR, Estimated >39 >39 mL/min   Anion gap 5 5 - 15  CBC with Differential (Cancer Center Only)  Result Value Ref Range   WBC Count 3.1 (L) 4.0 - 10.5 K/uL   RBC 3.60 (L) 3.87 - 5.11 MIL/uL   Hemoglobin 10.6 (L) 12.0 - 15.0 g/dL   HCT 69.1 (L) 63.9 - 53.9 %   MCV 85.6 80.0 - 100.0 fL   MCH 29.4 26.0 - 34.0 pg   MCHC 34.4 30.0 - 36.0 g/dL   RDW 78.4 (H) 88.4 - 84.4 %   Platelet Count 164 150 - 400 K/uL   nRBC 0.0 0.0 - 0.2 %   Neutrophils Relative % 46 %   Neutro Abs 1.5 (L)  1.7 - 7.7 K/uL   Lymphocytes Relative 39 %   Lymphs Abs 1.2 0.7 - 4.0 K/uL   Monocytes Relative 10 %   Monocytes Absolute 0.3 0.1 - 1.0 K/uL   Eosinophils Relative 4 %   Eosinophils Absolute 0.1 0.0 - 0.5 K/uL   Basophils Relative 1 %   Basophils Absolute 0.0 0.0 - 0.1 K/uL   Immature Granulocytes 0 %   Abs Immature Granulocytes 0.01 0.00 - 0.07 K/uL  Pregnancy, urine  Result Value Ref Range   Preg Test, Ur NEGATIVE NEGATIVE     RADIOGRAPHIC STUDIES:  No recent pertinent  imaging available to review.   CODE STATUS:  Code Status History     Date Active Date Inactive Code Status Order ID Comments User Context   01/17/2024 0459 01/23/2024 1727 Full Code [id number]  Alfornia Madison, MD ED    Questions for Most Recent Historical Code Status (Order [id number])     Question Answer   By: Consent: discussion documented in EHR            Orders Placed This Encounter  Procedures   CBC with Differential (Cancer Center Only)    Standing Status:   Future    Expected Date:   07/13/2024    Expiration Date:   07/13/2025   CMP (Cancer Center only)    Standing Status:   Future    Expected Date:   07/13/2024    Expiration Date:   07/13/2025     Future Appointments  Date Time Provider Department Center  06/05/2024  7:30 AM [facility] INFUSION [facility] None  06/22/2024  9:15 AM [facility] FLUSH [facility] None  06/22/2024  9:45 AM Alice Burnside, MD [facility] None  06/22/2024 10:45 AM [facility] INFUSION [facility] None  06/22/2024 11:15 AM Ivonne Harlene RAMAN, RD [facility] None  06/22/2024  1:00 PM Karlene Few, LCSW [facility] None      This document was completed utilizing speech recognition software. Grammatical errors, random word insertions, pronoun errors, and incomplete sentences are an occasional consequence of this system due to software limitations, ambient noise, and hardware issues. Any formal questions or concerns about the content, text or information contained within the body of this dictation should be directly addressed to the provider for clarification.

## 2024-06-01 NOTE — Progress Notes (Signed)
 [facility] CSW Progress Note  Visual merchandiser met with patient during treatment. Patient reported improved symptoms after collaborating with palliative care and oncologist. Improvement in symptoms has made treatment more tolerable. Patient continues to work and is ensuring she does not burn her self out, and is being intentional about resting when needed.       Follow Up Plan:  CSW will see patient on 11/14  Lizbeth Sprague, LCSW Clinical Social Worker [facility]

## 2024-06-01 NOTE — Assessment & Plan Note (Addendum)
 Please review oncology history for additional details and timeline of events.    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 MMR proficient.   Previously I discussed diagnosis, staging, prognosis, plan of care, treatment options.  Reviewed NCCN guidelines.  Patient had allergic reaction to FOLFOX regimen, likely to 5-FU pump. It could not be continued any longer.   Patient was agreeable to proceeding with capecitabine  and she started that from 03/29/2024.  Dose was reduced to avoid toxicity/risk of reaction.  Calculated dose is 1500 mg p.o. every morning and 1000 mg p.o. nightly for 2 weeks on, 1 week off.  Will plan to do oxaliplatin  at half-strength at a slower rate with each cycle.  She started oxaliplatin  from 03/30/2024.    During cycle 3 of CapeOx, because of progressive side effects including nausea, Xeloda  dose was decreased to 1000 mg twice daily.  She is tolerating this better.  She is tolerating treatment fairly well so far, except for grade 1 hand-foot syndrome.  Labs today reveal leukopenia with white count of 3100 but ANC is 1500.  No dose-limiting toxicities.  Will proceed with oxaliplatin  today.  She will start cycle 4 of capecitabine  from today.  - She was previously referred to genetic counseling for genetic testing.  - Monitor with circulating tumor DNA test and CT scans every three months for two years.  Circulating tumor DNA test was negative on 02/06/2024 and on 05/11/2024.   -We will schedule colonoscopy one year post-surgery.  RTC in 3 weeks for cycle 5 of XELOX.  Will obtain restaging CT scan after next visit.  To be ordered on future visit.

## 2024-06-01 NOTE — Progress Notes (Signed)
 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 S/p hemicolectomy on 6/12. Started adjuvant folfox 7/9. 5-FU discontinued due to toxicity. Patient currently receiving oxaliplatin  + capecitabine  (Xelox) q21d. Patient is under the care of Dr. Autumn.   Met with patient in infusion. Husband is present today. Patient reports she has been eating better recently. Appetite is fairly good on her week off. She continues to struggle with constipation. Had a BM yesterday. Patient is on bowel regimen and taking probiotics as suggested by palliative team. Scopolamine patch is working well for nausea. Her mouth stays dry as this is a side effect. Dryness is worse overnight.   Medications: reviewed   Labs: reviewed   Anthropometrics: Wt 164 lb 6.4 oz today increased   10/3 - 160 lb  9/12 - 160 lb 3.2   NUTRITION DIAGNOSIS: Altered GI function - continues    INTERVENTION:  Continue strategies for increasing calories and protein with small frequent meals/snacks Suggested xylitol melts for overnight dry mouth relief Continue bowel regimen per MD   MONITORING, EVALUATION, GOAL: wt trends, intake   NEXT VISIT: Friday November 14 during infusion

## 2024-06-01 NOTE — Patient Instructions (Signed)
 [facility] - A DEPT OF [facility]  Discharge Instructions: Thank you for choosing [facility] to provide your oncology and hematology care.   If you have a lab appointment with the Cancer Center, please go directly to the Cancer Center and check in at the registration area.   Wear comfortable clothing and clothing appropriate for easy access to any Portacath or PICC line.   We strive to give you quality time with your provider. You may need to reschedule your appointment if you arrive late (15 or more minutes).  Arriving late affects you and other patients whose appointments are after yours.  Also, if you miss three or more appointments without notifying the office, you may be dismissed from the clinic at the provider's discretion.      For prescription refill requests, have your pharmacy contact our office and allow 72 hours for refills to be completed.    Today you received the following chemotherapy and/or immunotherapy agents: oxaliplatin      To help prevent nausea and vomiting after your treatment, we encourage you to take your nausea medication as directed.  BELOW ARE SYMPTOMS THAT SHOULD BE REPORTED IMMEDIATELY: *FEVER GREATER THAN 100.4 F (38 C) OR HIGHER *CHILLS OR SWEATING *NAUSEA AND VOMITING THAT IS NOT CONTROLLED WITH YOUR NAUSEA MEDICATION *UNUSUAL SHORTNESS OF BREATH *UNUSUAL BRUISING OR BLEEDING *URINARY PROBLEMS (pain or burning when urinating, or frequent urination) *BOWEL PROBLEMS (unusual diarrhea, constipation, pain near the anus) TENDERNESS IN MOUTH AND THROAT WITH OR WITHOUT PRESENCE OF ULCERS (sore throat, sores in mouth, or a toothache) UNUSUAL RASH, SWELLING OR PAIN  UNUSUAL VAGINAL DISCHARGE OR ITCHING   Items with * indicate a potential emergency and should be followed up as soon as possible or go to the Emergency Department if any problems should occur.  Please show the CHEMOTHERAPY ALERT CARD or  IMMUNOTHERAPY ALERT CARD at check-in to the Emergency Department and triage nurse.  Should you have questions after your visit or need to cancel or reschedule your appointment, please contact [facility] - A DEPT OF [facility]  Dept: [phone]  and follow the prompts.  Office hours are 8:00 a.m. to 4:30 p.m. Monday - Friday. Please note that voicemails left after 4:00 p.m. may not be returned until the following business day.  We are closed weekends and major holidays. You have access to a nurse at all times for urgent questions. Please call the main number to the clinic Dept: [phone] and follow the prompts.   For any non-urgent questions, you may also contact your provider using MyChart. We now offer e-Visits for anyone 64 and older to request care online for non-urgent symptoms. For details visit [email or web address].   Also download the MyChart app! Go to the app store, search "MyChart", open the app, select [place], and log in with your MyChart username and password.

## 2024-06-05 ENCOUNTER — Other Ambulatory Visit: Payer: Self-pay | Admitting: Oncology

## 2024-06-05 ENCOUNTER — Inpatient Hospital Stay

## 2024-06-05 VITALS — BP 110/73 | HR 73 | Temp 98.1°F | Resp 16 | Wt 159.5 lb

## 2024-06-05 DIAGNOSIS — C180 Malignant neoplasm of cecum: Secondary | ICD-10-CM | POA: Diagnosis not present

## 2024-06-05 DIAGNOSIS — Z95828 Presence of other vascular implants and grafts: Secondary | ICD-10-CM

## 2024-06-05 MED ORDER — SODIUM CHLORIDE 0.9 % IV SOLN: Freq: Once | INTRAVENOUS | Status: AC

## 2024-06-05 MED ORDER — OLANZAPINE 5 MG PO TABS: ORAL_TABLET | ORAL | 1 refills | Status: AC

## 2024-06-05 MED ORDER — SODIUM CHLORIDE 0.9 % IV SOLN
12.5000 mg | Freq: Once | INTRAVENOUS | Status: AC
  Administered 2024-06-05: 12.5 mg via INTRAVENOUS
  Filled 2024-06-05: qty 0.5

## 2024-06-05 MED ORDER — OLANZAPINE 5 MG PO TABS
5.0000 mg | ORAL_TABLET | Freq: Once | ORAL | Status: AC
  Administered 2024-06-05: 5 mg via ORAL
  Filled 2024-06-05: qty 1

## 2024-06-05 NOTE — Progress Notes (Signed)
 Pt arrived to infusion for scheduled IVF. Pt c/o severe nausea 10/10. Phenergan 12.5mg  given. Nausea decreased to 7/10. Discussion btw MD and Pharm related to alternate nausea meds given pt extensive allergies. Zyprexa given. Nausea 4/10.

## 2024-06-05 NOTE — Patient Instructions (Signed)
 Dehydration, Adult Dehydration is a condition in which there is not enough water or other fluids in the body. This happens when a person loses more fluids than they take in. Important organs cannot work right without the right amount of fluids. Any loss of fluids from the body can cause dehydration. Dehydration can be mild, worse, or very bad. It should be treated right away to keep it from getting very bad. What are the causes? Conditions that cause loss of water in the body. They include: Watery poop (diarrhea). Vomiting. Sweating a lot. Fever. Infection. Peeing (urinating) a lot. Not drinking enough fluids. Certain medicines, such as medicines that take extra fluid out of the body (diuretics). Lack of safe drinking water. Not being able to get enough water and food. What increases the risk? Having a long-term (chronic) illness that has not been treated the right way, such as: Diabetes. Heart disease. Kidney disease. Being 25 years of age or older. Having a disability. Living in a place that is high above the ground or sea (high in altitude). The thinner, drier air causes more fluid loss. Doing exercises that put stress on your body for a long time. Being active when in hot places. What are the signs or symptoms? Symptoms of dehydration depend on how bad it is. Mild or worse dehydration Thirst. Dry lips or dry mouth. Feeling dizzy or light-headed. Muscle cramps. Passing little pee or dark pee. Pee may be the color of tea. Headache. Very bad dehydration Changes in skin. Skin may: Be cold to the touch (clammy). Be blotchy or pale. Not go back to normal right after you pinch it and let it go. Little or no tears, pee, or sweat. Fast breathing. Low blood pressure. Weak pulse. Pulse that is more than 100 beats a minute when you are sitting still. Other changes, such as: Feeling very thirsty. Eyes that look hollow (sunken). Cold hands and feet. Being confused. Being very  tired (lethargic) or having trouble waking from sleep. Losing weight. Loss of consciousness. How is this treated? Treatment for this condition depends on how bad your dehydration is. Treatment should start right away. Do not wait until your condition gets very bad. Very bad dehydration is an emergency. You will need to go to a hospital. Mild or worse dehydration can be treated at home. You may be asked to: Drink more fluids. Drink an oral rehydration solution (ORS). This drink gives you the right amount of fluids, salts, and minerals (electrolytes). Very bad dehydration can be treated: With fluids through an IV tube. By correcting low levels of electrolytes in the body. By treating the problem that caused your dehydration. Follow these instructions at home: Oral rehydration solution If told by your doctor, drink an ORS: Make an ORS. Use instructions on the package. Start by drinking small amounts, about  cup (120 mL) every 5-10 minutes. Slowly drink more until you have had the amount that your doctor said to have.  Eating and drinking  Drink enough clear fluid to keep your pee pale yellow. If you were told to drink an ORS, finish the ORS first. Then, start slowly drinking other clear fluids. Drink fluids such as: Water. Do not drink only water. Doing that can make the salt (sodium) level in your body get too low. Water from ice chips you suck on. Fruit juice that you have added water to (diluted). Low-calorie sports drinks. Eat foods that have the right amounts of salts and minerals, such as bananas, oranges, potatoes,  tomatoes, or spinach. Do not drink alcohol. Avoid drinks that have caffeine or sugar. These include:: High-calorie sports drinks. Fruit juice that you did not add water to. Soda. Coffee or energy drinks. Avoid foods that are greasy or have a lot of fat or sugar. General instructions Take over-the-counter and prescription medicines only as told by your doctor. Do  not take sodium tablets. Doing that can make the salt level in your body get too high. Return to your normal activities as told by your doctor. Ask your doctor what activities are safe for you. Keep all follow-up visits. Your doctor may check and change your treatment. Contact a doctor if: You have pain in your belly (abdomen) and the pain: Gets worse. Stays in one place. You have a rash. You have a stiff neck. You get angry or annoyed more easily than normal. You are more tired or have a harder time waking than normal. You feel weak or dizzy. You feel very thirsty. Get help right away if: You have any symptoms of very bad dehydration. You vomit every time you eat or drink. Your vomiting gets worse, does not go away, or you vomit blood or green stuff. You are getting treatment, but symptoms are getting worse. You have a fever. You have a very bad headache. You have: Diarrhea that gets worse or does not go away. Blood in your poop (stool). This may cause poop to look black and tarry. No pee in 6-8 hours. Only a small amount of pee in 6-8 hours, and the pee is very dark. You have trouble breathing. These symptoms may be an emergency. Get help right away. Call 911. Do not wait to see if the symptoms will go away. Do not drive yourself to the hospital. This information is not intended to replace advice given to you by your health care provider. Make sure you discuss any questions you have with your health care provider. Document Revised: 02/22/2022 Document Reviewed: 02/22/2022 Elsevier Patient Education  2024 [employer].

## 2024-06-10 ENCOUNTER — Encounter: Payer: Self-pay | Admitting: Oncology

## 2024-06-11 ENCOUNTER — Other Ambulatory Visit (HOSPITAL_COMMUNITY): Payer: Self-pay

## 2024-06-12 ENCOUNTER — Telehealth: Payer: Self-pay | Admitting: Nurse Practitioner

## 2024-06-12 NOTE — Telephone Encounter (Signed)
 I left voicemail for patient regarding scheduled palliative care appointment for 06/19/2024 per staff message. I advised patient to reach out if time/date does not work for her.

## 2024-06-13 ENCOUNTER — Other Ambulatory Visit (HOSPITAL_COMMUNITY): Payer: Self-pay

## 2024-06-15 ENCOUNTER — Other Ambulatory Visit: Payer: Self-pay

## 2024-06-15 NOTE — Progress Notes (Signed)
 Specialty Pharmacy Refill Coordination Note  Sherry Abbott is a 48 y.o. female contacted today regarding refills of specialty medication(s) Capecitabine  (XELODA )   Patient requested Delivery   Delivery date: 06/19/24   Verified address: [address]  [place] [address]   Medication will be filled on: 06/18/24

## 2024-06-18 ENCOUNTER — Other Ambulatory Visit: Payer: Self-pay

## 2024-06-19 ENCOUNTER — Inpatient Hospital Stay: Attending: Oncology | Admitting: Nurse Practitioner

## 2024-06-19 ENCOUNTER — Encounter: Payer: Self-pay | Admitting: Nurse Practitioner

## 2024-06-19 VITALS — BP 101/75 | HR 85 | Temp 97.5°F | Resp 16 | Ht 65.0 in | Wt 163.0 lb

## 2024-06-19 DIAGNOSIS — Z9049 Acquired absence of other specified parts of digestive tract: Secondary | ICD-10-CM | POA: Insufficient documentation

## 2024-06-19 DIAGNOSIS — T451X5A Adverse effect of antineoplastic and immunosuppressive drugs, initial encounter: Secondary | ICD-10-CM | POA: Insufficient documentation

## 2024-06-19 DIAGNOSIS — Z91011 Allergy to milk products, unspecified: Secondary | ICD-10-CM | POA: Insufficient documentation

## 2024-06-19 DIAGNOSIS — R110 Nausea: Secondary | ICD-10-CM

## 2024-06-19 DIAGNOSIS — Z5111 Encounter for antineoplastic chemotherapy: Secondary | ICD-10-CM | POA: Insufficient documentation

## 2024-06-19 DIAGNOSIS — Z882 Allergy status to sulfonamides status: Secondary | ICD-10-CM | POA: Insufficient documentation

## 2024-06-19 DIAGNOSIS — R253 Fasciculation: Secondary | ICD-10-CM | POA: Insufficient documentation

## 2024-06-19 DIAGNOSIS — Z91013 Allergy to seafood: Secondary | ICD-10-CM | POA: Insufficient documentation

## 2024-06-19 DIAGNOSIS — R112 Nausea with vomiting, unspecified: Secondary | ICD-10-CM | POA: Insufficient documentation

## 2024-06-19 DIAGNOSIS — Z7952 Long term (current) use of systemic steroids: Secondary | ICD-10-CM | POA: Insufficient documentation

## 2024-06-19 DIAGNOSIS — C180 Malignant neoplasm of cecum: Secondary | ICD-10-CM | POA: Insufficient documentation

## 2024-06-19 DIAGNOSIS — Z79899 Other long term (current) drug therapy: Secondary | ICD-10-CM | POA: Insufficient documentation

## 2024-06-19 DIAGNOSIS — M069 Rheumatoid arthritis, unspecified: Secondary | ICD-10-CM | POA: Insufficient documentation

## 2024-06-19 DIAGNOSIS — Z515 Encounter for palliative care: Secondary | ICD-10-CM

## 2024-06-19 DIAGNOSIS — R630 Anorexia: Secondary | ICD-10-CM

## 2024-06-19 DIAGNOSIS — R21 Rash and other nonspecific skin eruption: Secondary | ICD-10-CM | POA: Insufficient documentation

## 2024-06-19 DIAGNOSIS — M62838 Other muscle spasm: Secondary | ICD-10-CM | POA: Insufficient documentation

## 2024-06-19 DIAGNOSIS — D125 Benign neoplasm of sigmoid colon: Secondary | ICD-10-CM | POA: Insufficient documentation

## 2024-06-19 DIAGNOSIS — R530 Neoplastic (malignant) related fatigue: Secondary | ICD-10-CM

## 2024-06-19 DIAGNOSIS — Z5189 Encounter for other specified aftercare: Secondary | ICD-10-CM | POA: Insufficient documentation

## 2024-06-19 DIAGNOSIS — L987 Excessive and redundant skin and subcutaneous tissue: Secondary | ICD-10-CM | POA: Insufficient documentation

## 2024-06-19 DIAGNOSIS — J45909 Unspecified asthma, uncomplicated: Secondary | ICD-10-CM | POA: Insufficient documentation

## 2024-06-19 DIAGNOSIS — Z95828 Presence of other vascular implants and grafts: Secondary | ICD-10-CM

## 2024-06-19 DIAGNOSIS — E860 Dehydration: Secondary | ICD-10-CM | POA: Insufficient documentation

## 2024-06-19 DIAGNOSIS — K7689 Other specified diseases of liver: Secondary | ICD-10-CM | POA: Insufficient documentation

## 2024-06-19 DIAGNOSIS — R4781 Slurred speech: Secondary | ICD-10-CM | POA: Insufficient documentation

## 2024-06-19 DIAGNOSIS — M792 Neuralgia and neuritis, unspecified: Secondary | ICD-10-CM

## 2024-06-19 DIAGNOSIS — Z888 Allergy status to other drugs, medicaments and biological substances status: Secondary | ICD-10-CM | POA: Insufficient documentation

## 2024-06-19 DIAGNOSIS — Z881 Allergy status to other antibiotic agents status: Secondary | ICD-10-CM | POA: Insufficient documentation

## 2024-06-19 DIAGNOSIS — K5909 Other constipation: Secondary | ICD-10-CM | POA: Insufficient documentation

## 2024-06-19 DIAGNOSIS — R262 Difficulty in walking, not elsewhere classified: Secondary | ICD-10-CM | POA: Insufficient documentation

## 2024-06-19 DIAGNOSIS — G620 Drug-induced polyneuropathy: Secondary | ICD-10-CM | POA: Insufficient documentation

## 2024-06-19 DIAGNOSIS — D701 Agranulocytosis secondary to cancer chemotherapy: Secondary | ICD-10-CM | POA: Insufficient documentation

## 2024-06-19 DIAGNOSIS — Z79630 Long term (current) use of alkylating agent: Secondary | ICD-10-CM | POA: Insufficient documentation

## 2024-06-19 NOTE — Progress Notes (Signed)
 Palliative Medicine [facility]  [phone] [phone]) [phone]   Name: Sherry Abbott Date: 06/19/2024 MRN: [id number]  DOB: October 04, 1975  Patient Care Team: Sun, Vyvyan, MD as PCP - General (Family Medicine) Dasie Leonor CROME, MD as Consulting Physician (General Surgery) Autumn Millman, MD as Consulting Physician (Oncology) Mai Lynwood FALCON, MD as Consulting Physician (Rheumatology) Pickenpack-Cousar, Fannie SAILOR, NP as Nurse Practitioner (Hospice and Palliative Medicine) Silvano Valrie SQUIBB, RN as Registered Nurse    INTERVAL HISTORY: Sherry Abbott is a 48 y.o. 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  Palliative is seeing patient for symptom management and goals of care.   SOCIAL HISTORY:     reports that she has never smoked. She has never used smokeless tobacco. She reports that she does not drink alcohol and does not use drugs.  ADVANCE DIRECTIVES:  None on file   CODE STATUS: Full code  PAST MEDICAL HISTORY: Past Medical History:  Diagnosis Date   Allergic rhinitis    Allergic to food    blueberry   Allergic to shellfish    Asthma    Cancer (HCC)    Dermatographic urticaria    Hidradenitis suppurativa    Labral tear of right hip joint    surgery   Low back pain    Nausea and vomiting 01/17/2024   Other chronic allergic conjunctivitis    Polyarthralgia     ALLERGIES:  is allergic to betadine [povidone iodine], blueberry [vaccinium angustifolium], milk-related compounds, neosporin [neomycin-bacitracin zn-polymyx], povidone-iodine, shellfish allergy, sulfa antibiotics, adrucil  [fluorouracil ], neomycin, and other.  MEDICATIONS:  Current Outpatient Medications  Medication Sig Dispense Refill   B Complex Vitamins (VITAMIN B-COMPLEX) TABS Take 1 tablet by mouth daily  with supper.     bisacodyl  (DULCOLAX) 5 MG EC tablet Take 5 mg by mouth daily as needed for moderate constipation.     capecitabine  (XELODA ) 500 MG tablet Take 3 tablets (1500 mg) by mouth in the morning and 2 tablets (1000 mg) by mouth in the evening for 14 days on, followed by 7 days off. Repeat every 21 days.Take within 30 minutes after meals. 70 tablet 7   cetirizine  (ZYRTEC  ALLERGY) 10 MG tablet Take 1 tablet (10 mg total) by mouth at bedtime. 90 tablet 0   COLACE 100 MG capsule Take 200 mg by mouth in the morning and at bedtime.     dexamethasone  (DECADRON ) 4 MG tablet Take 2 tablets (8 mg total) by mouth daily. Start the day after chemotherapy for 2 days. Take with food. 30 tablet 1   diphenhydrAMINE  (BENADRYL ) 25 MG tablet Take 1 tablet (25 mg total) by mouth every 6 (six) hours as needed. 30 tablet 0   famotidine  (PEPCID ) 20 MG tablet Take 1 tablet (20 mg total) by mouth 2 (two) times daily. 30 tablet 0   fluticasone  (FLONASE ) 50 MCG/ACT nasal spray Place 2 sprays into both nostrils daily. 9.9 mL 0   folic acid  (FOLVITE ) 1 MG tablet Take 1 mg by mouth daily.     lidocaine -prilocaine  (EMLA ) cream Apply to affected area once 30 g 3   mometasone (NASONEX) 50 MCG/ACT nasal spray Place 2 sprays into the nose daily as needed (Allergies).     montelukast  (SINGULAIR ) 10 MG tablet Take 10 mg by mouth at bedtime.     Multiple Vitamin (MULTIVITAMIN WITH MINERALS) TABS tablet Take 1 tablet by mouth  daily.     OLANZapine (ZYPREXA) 5 MG tablet Take 1 tablet daily at bedtime for 3 days.  May repeat another dose in the morning, if persistent nausea. 30 tablet 1   ondansetron  (ZOFRAN ) 8 MG tablet Take 1 tablet (8 mg total) by mouth every 8 (eight) hours as needed for nausea or vomiting. Start on the third day after chemotherapy. 30 tablet 1   oxyCODONE  (OXY IR/ROXICODONE ) 5 MG immediate release tablet Take 1 tablet (5 mg total) by mouth every 8 (eight) hours as needed for severe pain (pain score 7-10) (5 mg  for pain score 4-6, 10 mg for pain score 7-10). 12 tablet 0   polyethylene glycol powder (GLYCOLAX /MIRALAX ) 17 GM/SCOOP powder Take 17 g by mouth daily as needed. 238 g 0   scopolamine (TRANSDERM-SCOP) 1 MG/3DAYS Place 1 patch (1 mg total) onto the skin every 3 (three) days. 10 patch 12   TYLENOL  500 MG tablet Take 500-1,000 mg by mouth every 6 (six) hours as needed for mild pain (pain score 1-3), headache or fever.     Current Facility-Administered Medications  Medication Dose Route Frequency Provider Last Rate Last Admin   Fremanezumab -vfrm SOSY 225 mg  225 mg Subcutaneous Once Ines Onetha NOVAK, MD        VITAL SIGNS: BP 101/75 (BP Location: Left Arm, Patient Position: Sitting)   Pulse 85   Temp (!) 97.5 F (36.4 C) (Temporal)   Resp 16   Ht 5' 5 (1.651 m)   Wt 163 lb (73.9 kg)   SpO2 99%   BMI 27.12 kg/m  Filed Weights   06/19/24 0916  Weight: 163 lb (73.9 kg)    Estimated body mass index is 27.12 kg/m as calculated from the following:   Height as of this encounter: 5' 5 (1.651 m).   Weight as of this encounter: 163 lb (73.9 kg).   PERFORMANCE STATUS (ECOG) : 1 - Symptomatic but completely ambulatory  Physical Exam General: NAD Cardiovascular: regular rate and rhythm Pulmonary: normal breathing pattern Extremities: no edema, no joint deformities Skin: no rashes Neurological: AAO x3  IMPRESSION: Discussed the use of AI scribe software for clinical note transcription with the patient, who gave verbal consent to proceed.  History of Present Illness Sherry Abbott is a 48 year old female with adenocarcinoma of the cecum who presents to clinic for symptom management follow-up. No acute distress. Denies uncontrolled nausea, vomiting, constipation, or diarrhea. No family present.   She describes an episode of heart palpitations, characterized by a sensation of her heart beating rapidly in her chest, which lasted for about 20-30 seconds. This was a singular  occurrence and did not cause pain.  Her appetite fluctuates, particularly when taking her medication, but she maintains a schedule of eating three times a day. The recent chemotherapy session was particularly challenging, requiring an infusion due to difficulty eating and drinking for about three days post-treatment. Her current weight is stable at 163lbs. States she was unable to tolerate any food for 4-5 days after treatment due to her nausea and significant taste changes. She has been using lemon drops and Metamucil fiber with probiotics, which she finds helpful in managing her symptoms.  Waverly has been experiencing nausea specifically post treatments, which has improved with the use of scopolamine patches. The patches initially cause dry mouth, but they are more effective by the second day of use. She received IVF post treatment last cycle which provided additional support. We discussed proactively arranging for IV  hydration and support to hopefully prevent significant nausea and fatigue after her upcoming treatment this week.   She experiences severe shooting pain in her hands and feet, which radiates up her legs following chemotherapy treatment. The pain is intense and not fully relieved by Korene, although it provides some improvement. Mrs. Gassner has been using Udderly cream for moisturizing her skin, which she finds somewhat helpful. She manages excess skin on her feet by cutting it off, which does not cause pain once removed.  All questions answered and support provided.   Assessment & Plan Chemotherapy-induced peripheral neuropathy Severe shooting pain radiating up the leg, improved with Nervive but still intense at times. GLENWOOD Yong Lor for symptom management.  Chemotherapy-induced nausea and vomiting Nausea improved with scopolamine patches, but significant nausea occurs post-chemotherapy, especially after the first five days. - Continue scopolamine patches, replacing with a  fresh patch on Thursday before the next infusion. - Scheduled IV fluids for Saturday and Monday post-chemotherapy to manage nausea and dehydration.  Chemotherapy-induced anorexia Appetite fluctuates, with decreased appetite during chemotherapy. Eating on a schedule helps manage symptoms. - Continue scheduled meals to maintain nutritional intake.  Chemotherapy-induced skin changes of feet Excess skin on feet managed by cutting with shears. Udderly 20% provides moisturizing benefits. - Continue using Udderly 20% for moisturizing. - Continue managing excess skin with shears.  Chemotherapy-induced fatigue and malaise Significant fatigue and malaise post-chemotherapy, with difficulty eating and drinking for several days after treatment. - Scheduled IV fluids for Saturday and Monday post-chemotherapy to manage fatigue and malaise.  Palpitations, single episode Single episode of palpitations lasting 20-30 seconds, described as feeling the heart beating in the chest. Knows to contact medical team if symptoms reoccur.   I will plan to see patient back in 3-4 weeks. Sooner if needed.   Patient expressed understanding and was in agreement with this plan. She also understands that She can call the clinic at any time with any questions, concerns, or complaints.   Any controlled substances utilized were prescribed in the context of palliative care. PDMP has been reviewed.   Visit consisted of counseling and education dealing with the complex and emotionally intense issues of symptom management and palliative care in the setting of serious and potentially life-threatening illness.  Levon Borer, AGPCNP-BC  Palliative Medicine Team/ Cancer Center

## 2024-06-19 NOTE — Progress Notes (Signed)
 Orders for IVF and PRN phenergan for infusion appts on both 11/15 and 11/17 placed under signed and held.

## 2024-06-22 ENCOUNTER — Inpatient Hospital Stay

## 2024-06-22 ENCOUNTER — Encounter: Payer: Self-pay | Admitting: Oncology

## 2024-06-22 ENCOUNTER — Inpatient Hospital Stay: Admitting: Dietician

## 2024-06-22 ENCOUNTER — Inpatient Hospital Stay: Admitting: Oncology

## 2024-06-22 ENCOUNTER — Other Ambulatory Visit: Payer: Self-pay

## 2024-06-22 VITALS — BP 101/68 | HR 81 | Temp 97.9°F | Resp 19 | Ht 65.0 in | Wt 162.8 lb

## 2024-06-22 DIAGNOSIS — J45909 Unspecified asthma, uncomplicated: Secondary | ICD-10-CM | POA: Diagnosis not present

## 2024-06-22 DIAGNOSIS — C180 Malignant neoplasm of cecum: Secondary | ICD-10-CM

## 2024-06-22 DIAGNOSIS — L987 Excessive and redundant skin and subcutaneous tissue: Secondary | ICD-10-CM | POA: Diagnosis not present

## 2024-06-22 DIAGNOSIS — G620 Drug-induced polyneuropathy: Secondary | ICD-10-CM | POA: Diagnosis not present

## 2024-06-22 DIAGNOSIS — R21 Rash and other nonspecific skin eruption: Secondary | ICD-10-CM | POA: Diagnosis not present

## 2024-06-22 DIAGNOSIS — R253 Fasciculation: Secondary | ICD-10-CM | POA: Diagnosis not present

## 2024-06-22 DIAGNOSIS — Z881 Allergy status to other antibiotic agents status: Secondary | ICD-10-CM | POA: Diagnosis not present

## 2024-06-22 DIAGNOSIS — K5909 Other constipation: Secondary | ICD-10-CM | POA: Diagnosis not present

## 2024-06-22 DIAGNOSIS — Z79630 Long term (current) use of alkylating agent: Secondary | ICD-10-CM | POA: Diagnosis not present

## 2024-06-22 DIAGNOSIS — M62838 Other muscle spasm: Secondary | ICD-10-CM | POA: Diagnosis not present

## 2024-06-22 DIAGNOSIS — Z882 Allergy status to sulfonamides status: Secondary | ICD-10-CM | POA: Diagnosis not present

## 2024-06-22 DIAGNOSIS — Z5111 Encounter for antineoplastic chemotherapy: Secondary | ICD-10-CM | POA: Diagnosis present

## 2024-06-22 DIAGNOSIS — E860 Dehydration: Secondary | ICD-10-CM | POA: Diagnosis not present

## 2024-06-22 DIAGNOSIS — D125 Benign neoplasm of sigmoid colon: Secondary | ICD-10-CM | POA: Diagnosis not present

## 2024-06-22 DIAGNOSIS — Z5189 Encounter for other specified aftercare: Secondary | ICD-10-CM | POA: Diagnosis not present

## 2024-06-22 DIAGNOSIS — M069 Rheumatoid arthritis, unspecified: Secondary | ICD-10-CM | POA: Diagnosis not present

## 2024-06-22 DIAGNOSIS — Z79899 Other long term (current) drug therapy: Secondary | ICD-10-CM | POA: Diagnosis not present

## 2024-06-22 DIAGNOSIS — K7689 Other specified diseases of liver: Secondary | ICD-10-CM | POA: Diagnosis not present

## 2024-06-22 DIAGNOSIS — R4781 Slurred speech: Secondary | ICD-10-CM | POA: Diagnosis not present

## 2024-06-22 DIAGNOSIS — Z7952 Long term (current) use of systemic steroids: Secondary | ICD-10-CM | POA: Diagnosis not present

## 2024-06-22 DIAGNOSIS — T451X5A Adverse effect of antineoplastic and immunosuppressive drugs, initial encounter: Secondary | ICD-10-CM | POA: Diagnosis not present

## 2024-06-22 DIAGNOSIS — R262 Difficulty in walking, not elsewhere classified: Secondary | ICD-10-CM | POA: Diagnosis not present

## 2024-06-22 DIAGNOSIS — D701 Agranulocytosis secondary to cancer chemotherapy: Secondary | ICD-10-CM | POA: Diagnosis not present

## 2024-06-22 DIAGNOSIS — R112 Nausea with vomiting, unspecified: Secondary | ICD-10-CM | POA: Diagnosis not present

## 2024-06-22 LAB — CMP (CANCER CENTER ONLY)
ALT: 18 U/L (ref 0–44)
AST: 26 U/L (ref 15–41)
Albumin: 4.2 g/dL (ref 3.5–5.0)
Alkaline Phosphatase: 34 U/L — ABNORMAL LOW (ref 38–126)
Anion gap: 5 (ref 5–15)
BUN: 13 mg/dL (ref 6–20)
CO2: 28 mmol/L (ref 22–32)
Calcium: 9.1 mg/dL (ref 8.9–10.3)
Chloride: 106 mmol/L (ref 98–111)
Creatinine: 0.91 mg/dL (ref 0.44–1.00)
GFR, Estimated: >60 mL/min (ref >60)
Glucose, Bld: 89 mg/dL (ref 70–99)
Potassium: 3.9 mmol/L (ref 3.5–5.1)
Sodium: 139 mmol/L (ref 135–145)
Total Bilirubin: 1.6 mg/dL — ABNORMAL HIGH (ref 0.0–1.2)
Total Protein: 7.2 g/dL (ref 6.5–8.1)

## 2024-06-22 LAB — CBC WITH DIFFERENTIAL (CANCER CENTER ONLY)
Abs Immature Granulocytes: 0.01 K/uL (ref 0.00–0.07)
Basophils Absolute: 0 K/uL (ref 0.0–0.1)
Basophils Relative: 0 %
Eosinophils Absolute: 0.1 K/uL (ref 0.0–0.5)
Eosinophils Relative: 3 %
HCT: 29.9 % — ABNORMAL LOW (ref 36.0–46.0)
Hemoglobin: 10.6 g/dL — ABNORMAL LOW (ref 12.0–15.0)
Immature Granulocytes: 0 %
Lymphocytes Relative: 36 %
Lymphs Abs: 0.9 K/uL (ref 0.7–4.0)
MCH: 30.7 pg (ref 26.0–34.0)
MCHC: 35.5 g/dL (ref 30.0–36.0)
MCV: 86.7 fL (ref 80.0–100.0)
Monocytes Absolute: 0.4 K/uL (ref 0.1–1.0)
Monocytes Relative: 14 %
Neutro Abs: 1.2 K/uL — ABNORMAL LOW (ref 1.7–7.7)
Neutrophils Relative %: 47 %
Platelet Count: 195 K/uL (ref 150–400)
RBC: 3.45 MIL/uL — ABNORMAL LOW (ref 3.87–5.11)
RDW: 21.3 % — ABNORMAL HIGH (ref 11.5–15.5)
WBC Count: 2.6 K/uL — ABNORMAL LOW (ref 4.0–10.5)
nRBC: 0 % (ref 0.0–0.2)

## 2024-06-22 LAB — PREGNANCY, URINE: Preg Test, Ur: NEGATIVE

## 2024-06-22 MED ORDER — DEXAMETHASONE SOD PHOSPHATE PF 10 MG/ML IJ SOLN
10.0000 mg | Freq: Once | INTRAMUSCULAR | Status: AC
  Administered 2024-06-22: 10 mg via INTRAVENOUS

## 2024-06-22 MED ORDER — PALONOSETRON HCL INJECTION 0.25 MG/5ML
0.2500 mg | Freq: Once | INTRAVENOUS | Status: AC
  Administered 2024-06-22: 0.25 mg via INTRAVENOUS
  Filled 2024-06-22: qty 5

## 2024-06-22 MED ORDER — OXALIPLATIN CHEMO INJECTION 100 MG/20ML
65.0000 mg/m2 | Freq: Once | INTRAVENOUS | Status: AC
  Administered 2024-06-22: 115 mg via INTRAVENOUS
  Filled 2024-06-22: qty 3

## 2024-06-22 MED ORDER — DEXTROSE 5 % IV SOLN: INTRAVENOUS | Status: DC

## 2024-06-22 MED ORDER — DIPHENHYDRAMINE HCL 50 MG/ML IJ SOLN
25.0000 mg | Freq: Once | INTRAMUSCULAR | Status: AC
  Administered 2024-06-22: 25 mg via INTRAVENOUS
  Filled 2024-06-22: qty 1

## 2024-06-22 MED ORDER — FAMOTIDINE IN NACL 20-0.9 MG/50ML-% IV SOLN
20.0000 mg | Freq: Once | INTRAVENOUS | Status: AC
  Administered 2024-06-22: 20 mg via INTRAVENOUS
  Filled 2024-06-22: qty 50

## 2024-06-22 NOTE — Progress Notes (Signed)
 [facility]  ONCOLOGY CLINIC PROGRESS NOTE   Patient Care Team: Sun, Vyvyan, MD as PCP - General (Family Medicine) Abbott Leonor CROME, MD as Consulting Physician (General Surgery) Autumn Millman, MD as Consulting Physician (Oncology) Mai Lynwood FALCON, MD as Consulting Physician (Rheumatology) Pickenpack-Cousar, Fannie SAILOR, NP as Nurse Practitioner (Hospice and Palliative Medicine) Silvano Valrie SQUIBB, RN as Registered Nurse  PATIENT NAME: Sherry Abbott   MR#: [id number] DOB: 07-10-1976  Date of visit: 06/22/2024   ASSESSMENT & PLAN:   Sherry Abbott is a 48 y.o. 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  pT3, pN1a, cM0, stage III B disease.  MMR proficient.  Side effects to 5-FU infusion with jaw clenching, muscle spasms of the tongue and generalized muscle twitching.  Adenocarcinoma of cecum [facility]) Please review oncology history for additional details and timeline of events.    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 MMR proficient.   Previously I discussed diagnosis, staging, prognosis, plan of care, treatment options.  Reviewed NCCN guidelines.  Patient had allergic reaction to FOLFOX regimen, likely to 5-FU pump. It could not be continued any longer.   Patient was agreeable to proceeding with capecitabine  and she started that from 03/29/2024.  Dose was reduced to avoid toxicity/risk of reaction.  Calculated dose is 1500 mg p.o. every morning and 1000 mg p.o. nightly for 2 weeks on, 1 week off.  Will plan to do oxaliplatin  at half-strength at a slower rate with each cycle.  She started oxaliplatin  from 03/30/2024.    During cycle 3 of CapeOx, because of  progressive side effects including nausea, Xeloda  dose was decreased to 1000 mg twice daily.  She is tolerating this better.  She is tolerating treatment reasonably well so far, but has been experiencing grade 2 hand-foot syndrome, nausea, fatigue.    Due to start cycle 5 of CapeOx today.  Labs today reveal leukopenia with white count of 2600 and ANC is 1200.  Will proceed with oxaliplatin  today.  She will start cycle 5 of capecitabine  from today.  Because of leukopenia/neutropenia, she will receive Neulasta on Monday, 06/25/2024.  - She was previously referred to genetic counseling for genetic testing.  - Monitor with circulating tumor DNA test and CT scans every three months for two years.  Circulating tumor DNA test was negative on 02/06/2024 and on 05/11/2024.   -We will schedule colonoscopy one year post-surgery.  RTC in 3 weeks for cycle 6 of XELOX.  Will obtain restaging CT scan prior to that, request placed today.  Chemotherapy-induced nausea and vomiting Experiencing severe nausea and vomiting post-treatment, with symptoms peaking 3-5 days post-infusion. Nausea affects eating and drinking, with food tasting chemical-like. Using Zyprexa and scopolamine patch for nausea management, which is helping. - Continue Zyprexa and scopolamine patch for nausea management. - Scheduled extra fluids on Monday to manage dehydration.  Chronic constipation Experiencing constipation, improved with dietary changes including apples, pears, and chia seeds. Bowel movements increased to 3-4 times a week. - Continue dietary modifications with apples, pears, and chia seeds.  Chemotherapy-induced hand-foot syndrome Experiencing hand-foot syndrome, with feet more affected than hands. Peeling and sore spots on feet, causing difficulty walking. Using Korene armin Sang for symptom management. - Continue using [employer]  and Udderly for hand-foot syndrome management.  Chemotherapy-induced neutropenia Neutrophil  count decreased from 1500 to 1200. White blood cell count at 2600. Hemoglobin and platelets are stable. -Will proceed with Neulasta injection on Monday, 06/25/2024  Chemotherapy-induced cold sensitivity and neuropathy Cold sensitivity persists, requiring warm fluids post-treatment. Tingling and numbness in hands and feet expected to take longer to resolve post-treatment. - Advise continuation of warm fluid intake to manage cold sensitivity. - Educate that neuropathy may take 3-4 weeks post-treatment to improve, with full resolution taking longer.  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 Capecitabine  is proposed as an alternative due to its different side effect profile. - Discontinue 5-FU due to severe allergic reaction. - Monitor for any allergic reactions to capecitabine .  I reviewed lab results and outside records for this visit and discussed relevant results with the patient. Diagnosis, plan of care and treatment options were also discussed in detail with the patient. Opportunity provided to ask questions and answers provided to her apparent satisfaction. Provided instructions to call our clinic with any problems, questions or concerns prior to return visit. I recommended to continue follow-up with PCP and sub-specialists. She verbalized understanding and agreed with the plan.   NCCN guidelines have been consulted in the planning of this patient's care.  I spent a total of 42 minutes during this encounter with the patient including review of chart and various tests results, discussions about plan of care and coordination of care plan.   Chinita Patten, MD  06/22/2024 5:05 PM  [facility] - A DEPT OF [facility] [address] [place] [address] Dept: [phone] Dept Fax: [phone]    CHIEF COMPLAINT/ REASON FOR VISIT:   Invasive adenocarcinoma of the cecum, status post hemicolectomy on 01/19/2024.  pT3, pN1a, cM0, stage III B disease.  MMR proficient.   Current Treatment: Adjuvant systemic chemotherapy with FOLFOX started from 02/15/2024.  However she had bad reaction to presumably 5-FU.  Treatment switched to XELOX from 03/29/2024.  INTERVAL HISTORY:    Discussed the use of AI scribe software for clinical note transcription with the patient, who gave verbal consent to proceed.  History of Present Illness  Sherry Laniesha Das is a 48 year old female undergoing chemotherapy who presents with worsening nausea and gastrointestinal side effects.  She experiences worsening nausea following recent chemotherapy treatments, particularly after oxaliplatin  infusions. Severe nausea and vomiting or dry heaving last for about three to six days post-infusion, during which she struggles to eat and drink as food tastes like chemicals, impacting her ability to maintain nutrition. Current medications for nausea include Zyprexa and a patch, which she finds helpful, along with wearing a C band for additional support.  She has ongoing issues with constipation, although there has been some improvement. She has been consuming apples, pears, and chia seeds with yogurt to increase fiber intake, resulting in bowel movements three to four times a week, compared to previously going a whole week without a bowel movement.  She experiences significant peeling and soreness in her feet, affecting her ability to walk. She reports that her symptoms are worse when she is taking kitetami and that they ease up during her week off. She uses Nervive and Udderly creams for relief and takes two capsules of kitetami in the morning and  two at night.  She has a history of allergies but did not experience any adverse reactions to IV contrast  during CT scans.  I have reviewed the past medical history, past surgical history, social history and family history with the patient and they are unchanged from previous note.  HISTORY OF PRESENT ILLNESS:   ONCOLOGY HISTORY:   Patient had routine screening colonoscopy on 01/12/2024, performed by Dr. Saintclair.  It showed a fungating, infiltrative, sessile, polypoid and ulcerated nonobstructing large mass in the cecum and in the ascending colon.  The mass was partially circumferential involving two thirds of the lumen circumference, measuring at least 7 cm in length.  Biopsies were obtained.  3 sessile polyps were found in the transverse colon, descending colon and sigmoid colon measuring up to 6 mm, removed.  Pathology from the cecal mass came back positive for invasive moderately differentiated adenocarcinoma.  MMR proficient.   On 01/16/2024, CT abdomen and pelvis showed 2.8 cm cyst in the left hepatic lobe, simple appearing.  No pathologic lymphadenopathy or other acute findings were noted.   She was referred for surgical oncology evaluation.  She was supposed to see Dr. Ann on 01/17/2024 but presented to the ED that same day with complaints of worsening right lower quadrant abdominal pain, nausea, vomiting and inability to tolerate oral intake.  She was admitted to the hospital for further evaluation and management.   She was seen by Dr. Dasie in the hospital and she underwent laparoscopic-assisted right hemicolectomy, excision of peritoneal cyst on 01/19/2024.  Final pathology showed invasive moderately differentiated adenocarcinoma with focal mucinous differentiation arising within a tubulovillous adenoma, measuring 6.7 cm in greatest dimension with involvement of submucosa, muscularis propria and into pericolonic adipose tissue.  1 out of 26 lymph nodes were positive for malignancy.  There was another single positive tumor deposit.  All margins were negative.  Grade 2.  No evidence of LVI or PNI.  MMR  proficient.     On her consultation with us  on 01/25/2024, request placed for CT of the chest for staging.  On 02/07/2024, staging CT of the chest showed no evidence of intrathoracic metastatic disease.  pT3,pN1a, cM0, Stage III B disease.    Plan made for adjuvant chemotherapy with modified FOLFOX-6, every 2 weeks for up to 12 cycles.  Started cycle 1 on 02/15/2024.  Initially it seemed as if she has suffered an allergic reaction to oxaliplatin  with tongue muscle twitching, difficulty speaking, and jaw muscle tightness, the day after chemo.  Discussed alternatives.  Plan made to proceed with reduced dose oxaliplatin  at 50% at a slower rate over 3 hours with extra allergy medicines including Benadryl  and Pepcid .   She received cycle 2 of FOLFOX without 5-FU pump and dose reduced oxaliplatin  at a slower rate on 02/27/2024.  On 02/28/2024, she had to present to the ED again with similar complaints of tongue muscle twitching, jaw clenching etc.  Symptoms probably resolved after stopping 5-FU pump in the ED.  It is now clear that she has allergic reaction to 5-FU infusion.  We cannot use it anymore because of the severity of reaction.  Patient willing to try capecitabine  with oxaliplatin .  If she were to suffer similar side effects, then we will have to hold chemotherapy and continue close surveillance with Guardant reveal and CT imaging.  Started Capecitabine  from 03/29/2024. Oxaliplatin  started from 03/30/24.  Tolerating this regimen well so far.   Oncology History  Adenocarcinoma of cecum (HCC)  01/17/2024 Initial Diagnosis  Adenocarcinoma of cecum (HCC)   01/25/2024 Cancer Staging   Staging form: Colon and Rectum, AJCC 8th Edition - Pathologic: Stage IIIB (pT3, pN1a, cM0) - Signed by Autumn Millman, MD on 02/15/2024 Total positive nodes: 1 Histologic grading system: 4 grade system Histologic grade (G): G2 Residual tumor (R): R0   02/15/2024 - 02/29/2024 Chemotherapy   Patient is on Treatment Plan  : COLORECTAL FOLFOX q14d x 3 months     03/30/2024 -  Chemotherapy   Patient is on Treatment Plan : COLORECTAL Xelox (Capeox)(130/850) q21d         REVIEW OF SYSTEMS:   Review of Systems - Oncology  All other pertinent systems were reviewed with the patient and are negative.  ALLERGIES: She is allergic to betadine [povidone iodine], blueberry [vaccinium angustifolium], milk-related compounds, neosporin [neomycin-bacitracin zn-polymyx], povidone-iodine, shellfish allergy, sulfa antibiotics, adrucil  [fluorouracil ], neomycin, and other.  MEDICATIONS:  Current Outpatient Medications  Medication Sig Dispense Refill   B Complex Vitamins (VITAMIN B-COMPLEX) TABS Take 1 tablet by mouth daily with supper.     bisacodyl  (DULCOLAX) 5 MG EC tablet Take 5 mg by mouth daily as needed for moderate constipation.     capecitabine  (XELODA ) 500 MG tablet Take 3 tablets (1500 mg) by mouth in the morning and 2 tablets (1000 mg) by mouth in the evening for 14 days on, followed by 7 days off. Repeat every 21 days.Take within 30 minutes after meals. 70 tablet 7   cetirizine  (ZYRTEC  ALLERGY) 10 MG tablet Take 1 tablet (10 mg total) by mouth at bedtime. 90 tablet 0   COLACE 100 MG capsule Take 200 mg by mouth in the morning and at bedtime.     dexamethasone  (DECADRON ) 4 MG tablet Take 2 tablets (8 mg total) by mouth daily. Start the day after chemotherapy for 2 days. Take with food. 30 tablet 1   diphenhydrAMINE  (BENADRYL ) 25 MG tablet Take 1 tablet (25 mg total) by mouth every 6 (six) hours as needed. 30 tablet 0   famotidine  (PEPCID ) 20 MG tablet Take 1 tablet (20 mg total) by mouth 2 (two) times daily. 30 tablet 0   fluticasone  (FLONASE ) 50 MCG/ACT nasal spray Place 2 sprays into both nostrils daily. 9.9 mL 0   folic acid  (FOLVITE ) 1 MG tablet Take 1 mg by mouth daily.     lidocaine -prilocaine  (EMLA ) cream Apply to affected area once 30 g 3   mometasone (NASONEX) 50 MCG/ACT nasal spray Place 2 sprays into the  nose daily as needed (Allergies).     montelukast  (SINGULAIR ) 10 MG tablet Take 10 mg by mouth at bedtime.     Multiple Vitamin (MULTIVITAMIN WITH MINERALS) TABS tablet Take 1 tablet by mouth daily.     OLANZapine (ZYPREXA) 5 MG tablet Take 1 tablet daily at bedtime for 3 days.  May repeat another dose in the morning, if persistent nausea. 30 tablet 1   ondansetron  (ZOFRAN ) 8 MG tablet Take 1 tablet (8 mg total) by mouth every 8 (eight) hours as needed for nausea or vomiting. Start on the third day after chemotherapy. 30 tablet 1   oxyCODONE  (OXY IR/ROXICODONE ) 5 MG immediate release tablet Take 1 tablet (5 mg total) by mouth every 8 (eight) hours as needed for severe pain (pain score 7-10) (5 mg for pain score 4-6, 10 mg for pain score 7-10). 12 tablet 0   polyethylene glycol powder (GLYCOLAX /MIRALAX ) 17 GM/SCOOP powder Take 17 g by mouth daily as needed. 238 g 0   scopolamine (TRANSDERM-SCOP)  1 MG/3DAYS Place 1 patch (1 mg total) onto the skin every 3 (three) days. 10 patch 12   TYLENOL  500 MG tablet Take 500-1,000 mg by mouth every 6 (six) hours as needed for mild pain (pain score 1-3), headache or fever.     Current Facility-Administered Medications  Medication Dose Route Frequency Provider Last Rate Last Admin   Fremanezumab -vfrm SOSY 225 mg  225 mg Subcutaneous Once Ines Onetha NOVAK, MD         VITALS:   Blood pressure 101/68, pulse 81, temperature 97.9 F (36.6 C), temperature source Temporal, resp. rate 19, height 5' 5 (1.651 m), weight 162 lb 12.8 oz (73.8 kg), SpO2 99%.  Wt Readings from Last 3 Encounters:  06/22/24 162 lb 12.8 oz (73.8 kg)  06/19/24 163 lb (73.9 kg)  06/05/24 159 lb 8 oz (72.3 kg)    Body mass index is 27.09 kg/m.    Onc Performance Status - 06/22/24 0900       ECOG Perf Status   ECOG Perf Status Restricted in physically strenuous activity but ambulatory and able to carry out work of a light or sedentary nature, e.g., light house work, office work       KPS SCALE   KPS % SCORE Normal activity with effort, some s/s of disease            PHYSICAL EXAM:   Physical Exam Constitutional:      General: She is not in acute distress.    Appearance: Normal appearance.  HENT:     Head: Normocephalic and atraumatic.  Cardiovascular:     Rate and Rhythm: Normal rate.  Pulmonary:     Effort: Pulmonary effort is normal. No respiratory distress.  Chest:     Comments: Right-sided Port-A-Cath in place without any signs of infection Abdominal:     General: There is no distension.  Neurological:     General: No focal deficit present.     Mental Status: She is alert and oriented to person, place, and time.  Psychiatric:        Mood and Affect: Mood normal.        Behavior: Behavior normal.       LABORATORY DATA:   I have reviewed the data as listed.  Results for orders placed or performed in visit on 06/22/24  CMP (Cancer Center only)  Result Value Ref Range   Sodium 139 135 - 145 mmol/L   Potassium 3.9 3.5 - 5.1 mmol/L   Chloride 106 98 - 111 mmol/L   CO2 28 22 - 32 mmol/L   Glucose, Bld 89 70 - 99 mg/dL   BUN 13 6 - 20 mg/dL   Creatinine 9.08 9.55 - 1.00 mg/dL   Calcium  9.1 8.9 - 10.3 mg/dL   Total Protein 7.2 6.5 - 8.1 g/dL   Albumin  4.2 3.5 - 5.0 g/dL   AST 26 15 - 41 U/L   ALT 18 0 - 44 U/L   Alkaline Phosphatase 34 (L) 38 - 126 U/L   Total Bilirubin 1.6 (H) 0.0 - 1.2 mg/dL   GFR, Estimated >39 >39 mL/min   Anion gap 5 5 - 15  CBC with Differential (Cancer Center Only)  Result Value Ref Range   WBC Count 2.6 (L) 4.0 - 10.5 K/uL   RBC 3.45 (L) 3.87 - 5.11 MIL/uL   Hemoglobin 10.6 (L) 12.0 - 15.0 g/dL   HCT 70.0 (L) 63.9 - 53.9 %   MCV 86.7 80.0 - 100.0 fL  MCH 30.7 26.0 - 34.0 pg   MCHC 35.5 30.0 - 36.0 g/dL   RDW 78.6 (H) 88.4 - 84.4 %   Platelet Count 195 150 - 400 K/uL   nRBC 0.0 0.0 - 0.2 %   Neutrophils Relative % 47 %   Neutro Abs 1.2 (L) 1.7 - 7.7 K/uL   Lymphocytes Relative 36 %   Lymphs Abs 0.9 0.7 -  4.0 K/uL   Monocytes Relative 14 %   Monocytes Absolute 0.4 0.1 - 1.0 K/uL   Eosinophils Relative 3 %   Eosinophils Absolute 0.1 0.0 - 0.5 K/uL   Basophils Relative 0 %   Basophils Absolute 0.0 0.0 - 0.1 K/uL   Immature Granulocytes 0 %   Abs Immature Granulocytes 0.01 0.00 - 0.07 K/uL  Pregnancy, urine  Result Value Ref Range   Preg Test, Ur NEGATIVE NEGATIVE     RADIOGRAPHIC STUDIES:  No recent pertinent imaging available to review.   CODE STATUS:  Code Status History     Date Active Date Inactive Code Status Order ID Comments User Context   01/17/2024 0459 01/23/2024 1727 Full Code [id number]  Alfornia Madison, MD ED    Questions for Most Recent Historical Code Status (Order [id number])     Question Answer   By: Consent: discussion documented in EHR            Orders Placed This Encounter  Procedures   CT CHEST ABDOMEN PELVIS W CONTRAST    Standing Status:   Future    Expected Date:   07/09/2024    Expiration Date:   06/22/2025    If indicated for the ordered procedure, I authorize the administration of contrast media per Radiology protocol:   Yes    Does the patient have a contrast media/X-ray dye allergy?:   No    Preferred imaging location?:   [facility]    If indicated for the ordered procedure, I authorize the administration of oral contrast media per Radiology protocol:   Yes    Is patient pregnant?:   No     Future Appointments  Date Time Provider Department Center  06/23/2024 10:30 AM [facility] INF[facility]ION [facility] None  06/25/2024  2:30 PM [facility] None      This document was completed utilizing speech recognition software. Grammatical errors, random word insertions, pronoun errors, and incomplete sentences are an occasional consequence of this system due to software limitations, ambient noise, and hardware issues. Any formal questions or concerns about the content, text or information contained within the body of  this dictation should be directly addressed to the provider for clarification.

## 2024-06-22 NOTE — Patient Instructions (Signed)
 [facility] - A DEPT OF [facility]  Discharge Instructions: Thank you for choosing [facility] to provide your oncology and hematology care.   If you have a lab appointment with the Cancer Center, please go directly to the Cancer Center and check in at the registration area.   Wear comfortable clothing and clothing appropriate for easy access to any Portacath or PICC line.   We strive to give you quality time with your provider. You may need to reschedule your appointment if you arrive late (15 or more minutes).  Arriving late affects you and other patients whose appointments are after yours.  Also, if you miss three or more appointments without notifying the office, you may be dismissed from the clinic at the provider's discretion.      For prescription refill requests, have your pharmacy contact our office and allow 72 hours for refills to be completed.    Today you received the following chemotherapy and/or immunotherapy agents: oxaliplatin      To help prevent nausea and vomiting after your treatment, we encourage you to take your nausea medication as directed.  BELOW ARE SYMPTOMS THAT SHOULD BE REPORTED IMMEDIATELY: *FEVER GREATER THAN 100.4 F (38 C) OR HIGHER *CHILLS OR SWEATING *NAUSEA AND VOMITING THAT IS NOT CONTROLLED WITH YOUR NAUSEA MEDICATION *UNUSUAL SHORTNESS OF BREATH *UNUSUAL BRUISING OR BLEEDING *URINARY PROBLEMS (pain or burning when urinating, or frequent urination) *BOWEL PROBLEMS (unusual diarrhea, constipation, pain near the anus) TENDERNESS IN MOUTH AND THROAT WITH OR WITHOUT PRESENCE OF ULCERS (sore throat, sores in mouth, or a toothache) UNUSUAL RASH, SWELLING OR PAIN  UNUSUAL VAGINAL DISCHARGE OR ITCHING   Items with * indicate a potential emergency and should be followed up as soon as possible or go to the Emergency Department if any problems should occur.  Please show the CHEMOTHERAPY ALERT CARD or  IMMUNOTHERAPY ALERT CARD at check-in to the Emergency Department and triage nurse.  Should you have questions after your visit or need to cancel or reschedule your appointment, please contact [facility] - A DEPT OF [facility]  Dept: [phone]  and follow the prompts.  Office hours are 8:00 a.m. to 4:30 p.m. Monday - Friday. Please note that voicemails left after 4:00 p.m. may not be returned until the following business day.  We are closed weekends and major holidays. You have access to a nurse at all times for urgent questions. Please call the main number to the clinic Dept: [phone] and follow the prompts.   For any non-urgent questions, you may also contact your provider using MyChart. We now offer e-Visits for anyone 64 and older to request care online for non-urgent symptoms. For details visit [email or web address].   Also download the MyChart app! Go to the app store, search "MyChart", open the app, select [place], and log in with your MyChart username and password.

## 2024-06-22 NOTE — Progress Notes (Signed)
 [facility] CSW Progress Note  Clinical Child Psychotherapist assisting patient with adjustment to diagnosis. CSW met with patient and spouse during infusion treatment. Patient expressed continued effective management of symptoms, and is hopeful scans will show treatment has been successful.  Patient expressed interest in having support for her 50 year old and 20 year if desired by them. CSW will provide email of options internal to [facility] and external to [facility].  Lizbeth Sprague, LCSW Clinical Social Worker [facility]

## 2024-06-22 NOTE — Progress Notes (Signed)
 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 S/p hemicolectomy on 6/12. Started adjuvant folfox 7/9. 5-FU discontinued due to toxicity. Patient currently receiving oxaliplatin  + capecitabine  (Xelox) q21d. Patient is under the care of Dr. Autumn.    Met with patient and husband in infusion. Patient sleepy from Benadryl , however agreeable to visit. She reports scopolamine patches are working well for nausea. Appetite is about the same. Constipation has improved. Patient on bowel regimen, however reports making a chia pudding with sliced apples, nuts, dried cranberries which has been very beneficial. She has eaten this 3 times with good outcomes. Patient is hydrating well despite not liking room temperature water . She is in good spirits with improvement of treatment related side effects.     Medications: reviewed   Labs: reviewed   Anthropometrics: Wt 162 lb 12.8 oz today   10/24 - 164 lb 6.4 oz  9/12 - 160 lb 3.2 oz    NUTRITION DIAGNOSIS: Altered GI function - ongoing    INTERVENTION:  Continue strategies for increasing calories and protein  Continue bowel regimen + chia pudding for constipation  Support and encouragement     MONITORING, EVALUATION, GOAL: wt trends, intake   NEXT VISIT: To be scheduled with treatment as needed

## 2024-06-22 NOTE — Assessment & Plan Note (Addendum)
 Please review oncology history for additional details and timeline of events.    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 MMR proficient.   Previously I discussed diagnosis, staging, prognosis, plan of care, treatment options.  Reviewed NCCN guidelines.  Patient had allergic reaction to FOLFOX regimen, likely to 5-FU pump. It could not be continued any longer.   Patient was agreeable to proceeding with capecitabine  and she started that from 03/29/2024.  Dose was reduced to avoid toxicity/risk of reaction.  Calculated dose is 1500 mg p.o. every morning and 1000 mg p.o. nightly for 2 weeks on, 1 week off.  Will plan to do oxaliplatin  at half-strength at a slower rate with each cycle.  She started oxaliplatin  from 03/30/2024.    During cycle 3 of CapeOx, because of progressive side effects including nausea, Xeloda  dose was decreased to 1000 mg twice daily.  She is tolerating this better.  She is tolerating treatment reasonably well so far, but has been experiencing grade 2 hand-foot syndrome, nausea, fatigue.    Due to start cycle 5 of CapeOx today.  Labs today reveal leukopenia with white count of 2600 and ANC is 1200.  Will proceed with oxaliplatin  today.  She will start cycle 5 of capecitabine  from today.  Because of leukopenia/neutropenia, she will receive Neulasta on Monday, 06/25/2024.  - She was previously referred to genetic counseling for genetic testing.  - Monitor with circulating tumor DNA test and CT scans every three months for two years.  Circulating tumor DNA test was negative on 02/06/2024 and on 05/11/2024.   -We will schedule colonoscopy one year post-surgery.  RTC in 3 weeks for cycle 6 of XELOX.  Will obtain restaging CT scan prior to that, request placed today.

## 2024-06-22 NOTE — Progress Notes (Signed)
 Per Dr. Pasam She is okay to proceed with treatment today. Her white count is low and hence she will receive Neulasta on Monday.  Thailyn Khalid, PharmD, MBA

## 2024-06-22 NOTE — Progress Notes (Signed)
 The following biosimilar Udenyca ([id number]) has been selected for use in this patient. No PA req'd per PA team.  Wilma Dollar, Pharm.D., CPP 06/22/2024@2 :56 PM

## 2024-06-23 ENCOUNTER — Inpatient Hospital Stay

## 2024-06-23 VITALS — BP 122/86 | HR 86 | Temp 97.8°F | Resp 16

## 2024-06-23 DIAGNOSIS — Z95828 Presence of other vascular implants and grafts: Secondary | ICD-10-CM

## 2024-06-23 DIAGNOSIS — C180 Malignant neoplasm of cecum: Secondary | ICD-10-CM | POA: Diagnosis not present

## 2024-06-23 MED ORDER — SODIUM CHLORIDE 0.9 % IV SOLN: 25.0000 mg | Freq: Once | INTRAVENOUS | Status: DC

## 2024-06-23 MED ORDER — SODIUM CHLORIDE 0.9 % IV SOLN: Freq: Once | INTRAVENOUS | Status: AC

## 2024-06-23 MED ORDER — OLANZAPINE 5 MG PO TABS
5.0000 mg | ORAL_TABLET | Freq: Once | ORAL | Status: AC
  Administered 2024-06-23: 5 mg via ORAL
  Filled 2024-06-23 (×5): qty 1

## 2024-06-23 NOTE — Progress Notes (Signed)
 Order entered for Zyprexa 5mg  PO x 1 for nausea per Dr. Pasam.  Micaiah Remillard, PharmD, [facility]

## 2024-06-23 NOTE — Patient Instructions (Signed)

## 2024-06-25 ENCOUNTER — Inpatient Hospital Stay

## 2024-06-25 VITALS — BP 119/74 | HR 69 | Temp 98.4°F | Resp 16

## 2024-06-25 DIAGNOSIS — C180 Malignant neoplasm of cecum: Secondary | ICD-10-CM

## 2024-06-25 DIAGNOSIS — Z95828 Presence of other vascular implants and grafts: Secondary | ICD-10-CM

## 2024-06-25 MED ORDER — SODIUM CHLORIDE 0.9% FLUSH
10.0000 mL | Freq: Once | INTRAVENOUS | Status: AC
  Administered 2024-06-25: 10 mL

## 2024-06-25 MED ORDER — SODIUM CHLORIDE 0.9 % IV SOLN
25.0000 mg | Freq: Once | INTRAVENOUS | Status: AC
  Administered 2024-06-25: 25 mg via INTRAVENOUS
  Filled 2024-06-25: qty 1

## 2024-06-25 MED ORDER — SODIUM CHLORIDE 0.9 % IV SOLN: Freq: Once | INTRAVENOUS | Status: AC

## 2024-06-25 MED ORDER — PEGFILGRASTIM-CBQV 6 MG/0.6ML ~~LOC~~ SOSY
6.0000 mg | PREFILLED_SYRINGE | Freq: Once | SUBCUTANEOUS | Status: AC
  Administered 2024-06-25: 6 mg via SUBCUTANEOUS
  Filled 2024-06-25: qty 0.6

## 2024-06-25 NOTE — Patient Instructions (Signed)
 Rehydration, Adult Rehydration is the replacement of fluids, salts, and minerals in the body (electrolytes) that are lost during dehydration. Dehydration is when there is not enough water  or other fluids in the body. This happens when you lose more fluids than you take in. Common causes of dehydration include: Not drinking enough fluids. This can occur when you are ill or doing activities that require a lot of energy, especially in hot weather. Conditions that cause loss of water  or other fluids. These include diarrhea, vomiting, sweating, and urinating a lot. Other illnesses, such as fever or infection. Certain medicines, such as those that remove excess fluid from the body (diuretics). Symptoms of mild or moderate dehydration may include thirst, dry lips and mouth, and dizziness. Symptoms of severe dehydration may include increased heart rate, confusion, fainting, and not urinating. In severe cases, you may need to get fluids through an IV at the hospital. For mild or moderate cases, you can usually rehydrate at home by drinking certain fluids as told by your health care provider. What are the risks? Your health care provider will talk with you about risks. Your health care provider will talk with you about risks. This may include taking in too much fluid (overhydration). This is rare. Overhydration can cause an imbalance of electrolytes in the body, kidney failure, or a decrease in salt (sodium) levels in the body. Supplies needed: You will need an oral rehydration solution (ORS) if your health care provider tells you to use one. This is a drink to treat dehydration. It can be found in pharmacies and retail stores. How to rehydrate Fluids Follow instructions from your health care provider about what to drink. The kind of fluid and the amount you should drink depend on your condition. In general, you should choose drinks that you prefer. If told by your health care provider, drink an ORS. Make an  ORS by following instructions on the package. Start by drinking small amounts, about  cup (120 mL) every 5-10 minutes. Slowly increase how much you drink until you have taken in the amount recommended by your health care provider. Drink enough clear fluids to keep your urine pale yellow. If you were told to drink an ORS, finish it first, then start slowly drinking other clear fluids. Drink fluids such as: Water . This includes sparkling and flavored water . Drinking only water  can lead to having too little sodium in your body (hyponatremia). Follow the advice of your health care provider. Water  from ice chips you suck on. Fruit juice with water  added to it (diluted). Sports drinks. Hot or cold herbal teas. Broth-based soups. Milk or milk products. Food Follow instructions from your health care provider about what to eat while you rehydrate. Your health care provider may recommend that you slowly begin eating regular foods in small amounts. Eat foods that contain a healthy balance of electrolytes, such as bananas, oranges, potatoes, tomatoes, and spinach. Avoid foods that are greasy or contain a lot of sugar. In some cases, you may get nutrition through a feeding tube that is passed through your nose and into your stomach (nasogastric tube, or NG tube). This may be done if you have uncontrolled vomiting or diarrhea. Drinks to avoid  Certain drinks may make dehydration worse. While you rehydrate, avoid drinking alcohol. How to tell if you are recovering from dehydration You may be getting better if: You are urinating more often than before you started rehydrating. Your urine is pale yellow. Your energy level improves. You vomit less  often. You have diarrhea less often. Your appetite improves or returns to normal. You feel less dizzy or light-headed. Your skin tone and color start to look more normal. Follow these instructions at home: Take over-the-counter and prescription medicines only  as told by your health care provider. Do not take sodium tablets. Doing this can lead to having too much sodium in your body (hypernatremia). Contact a health care provider if: You continue to have symptoms of mild or moderate dehydration, such as: Thirst. Dry lips. Slightly dry mouth. Dizziness. Dark urine or less urine than normal. Muscle cramps. You continue to vomit or have diarrhea. Get help right away if: You have symptoms of dehydration that get worse. You have a fever. You have a severe headache. You have been vomiting and have problems, such as: Your vomiting gets worse or does not go away. Your vomit includes blood or green matter (bile). You cannot eat or drink without vomiting. You have problems with urination or bowel movements, such as: Diarrhea that gets worse or does not go away. Blood in your stool (feces). This may cause stool to look black and tarry. Not urinating, or urinating only a small amount of very dark urine, within 6-8 hours. You have trouble breathing. You have symptoms that get worse with treatment. These symptoms may be an emergency. Get help right away. Call 911. Do not wait to see if the symptoms will go away. Do not drive yourself to the hospital. This information is not intended to replace advice given to you by your health care provider. Make sure you discuss any questions you have with your health care provider.  Pegfilgrastim Injection What is this medication? PEGFILGRASTIM (PEG fil gra stim) lowers the risk of infection in people who are receiving chemotherapy. It works by systems analyst make more white blood cells, which protects your body from infection. It may also be used to help people who have been exposed to high doses of radiation. This medicine may be used for other purposes; ask your health care provider or pharmacist if you have questions. COMMON BRAND NAME(S): Fulphila, Fylnetra, Neulasta, Nyvepria, Stimufend, UDENYCA, UDENYCA  ONBODY, Ziextenzo What should I tell my care team before I take this medication? They need to know if you have any of these conditions: Kidney disease Latex allergy Ongoing radiation therapy Sickle cell disease Skin reactions to acrylic adhesives (On-Body Injector only) An unusual or allergic reaction to pegfilgrastim, filgrastim, other medications, foods, dyes, or preservatives Pregnant or trying to get pregnant Breast-feeding How should I use this medication? This medication is for injection under the skin. If you get this medication at home, you will be taught how to prepare and give the pre-filled syringe or how to use the On-body Injector. Refer to the patient Instructions for Use for detailed instructions. Use exactly as directed. Tell your care team immediately if you suspect that the On-body Injector may not have performed as intended or if you suspect the use of the On-body Injector resulted in a missed or partial dose. It is important that you put your used needles and syringes in a special sharps container. Do not put them in a trash can. If you do not have a sharps container, call your pharmacist or care team to get one. Talk to your care team about the use of this medication in children. While this medication may be prescribed for selected conditions, precautions do apply. Overdosage: If you think you have taken too much of this medicine contact  a poison control center or emergency room at once. NOTE: This medicine is only for you. Do not share this medicine with others. What if I miss a dose? It is important not to miss your dose. Call your care team if you miss your dose. If you miss a dose due to an On-body Injector failure or leakage, a new dose should be administered as soon as possible using a single prefilled syringe for manual use. What may interact with this medication? Interactions have not been studied. This list may not describe all possible interactions. Give your  health care provider a list of all the medicines, herbs, non-prescription drugs, or dietary supplements you use. Also tell them if you smoke, drink alcohol, or use illegal drugs. Some items may interact with your medicine. What should I watch for while using this medication? Your condition will be monitored carefully while you are receiving this medication. You may need blood work done while you are taking this medication. Talk to your care team about your risk of cancer. You may be more at risk for certain types of cancer if you take this medication. If you are going to need a MRI, CT scan, or other procedure, tell your care team that you are using this medication (On-Body Injector only). What side effects may I notice from receiving this medication? Side effects that you should report to your care team as soon as possible: Allergic reactions--skin rash, itching, hives, swelling of the face, lips, tongue, or throat Capillary leak syndrome--stomach or muscle pain, unusual weakness or fatigue, feeling faint or lightheaded, decrease in the amount of urine, swelling of the ankles, hands, or feet, trouble breathing High white blood cell level--fever, fatigue, trouble breathing, night sweats, change in vision, weight loss Inflammation of the aorta--fever, fatigue, back, chest, or stomach pain, severe headache Kidney injury (glomerulonephritis)--decrease in the amount of urine, red or dark brown urine, foamy or bubbly urine, swelling of the ankles, hands, or feet Shortness of breath or trouble breathing Spleen injury--pain in upper left stomach or shoulder Unusual bruising or bleeding Side effects that usually do not require medical attention (report to your care team if they continue or are bothersome): Bone pain Pain in the hands or feet This list may not describe all possible side effects. Call your doctor for medical advice about side effects. You may report side effects to FDA at  1-800-FDA-1088. Where should I keep my medication? Keep out of the reach of children. If you are using this medication at home, you will be instructed on how to store it. Throw away any unused medication after the expiration date on the label. NOTE: This sheet is a summary. It may not cover all possible information. If you have questions about this medicine, talk to your doctor, pharmacist, or health care provider.  2024 Elsevier/Gold Standard (2021-06-26 00:00:00) Document Revised: 12/07/2021 Document Reviewed: 12/07/2021 Elsevier Patient Education  2024 [employer].

## 2024-06-29 ENCOUNTER — Other Ambulatory Visit: Payer: Self-pay

## 2024-06-29 NOTE — Progress Notes (Signed)
 Specialty Pharmacy Ongoing Clinical Assessment Note  Sherry Abbott is a 48 y.o. female who is being followed by the specialty pharmacy service for RxSp Oncology   Patient's specialty medication(s) reviewed today: Capecitabine  (XELODA )   Missed doses in the last 4 weeks: 0   Patient/Caregiver did not have any additional questions or concerns.   Therapeutic benefit summary: Patient is achieving benefit   Adverse events/side effects summary: Experienced adverse events/side effects (Patient reports nausea, consitpation/ GI slow down, sore gums, hyperpigmented tongue, palms, and soles, pain in feet.)   Patient's therapy is appropriate to: Continue    Goals Addressed             This Visit's Progress    Maintain optimal adherence to therapy   On track    Patient is on track. Patient will maintain adherence         Follow up: 3 months  [facility] Specialty Pharmacist

## 2024-06-29 NOTE — Progress Notes (Signed)
 Specialty Pharmacy Refill Coordination Note  Sherry Abbott is a 48 y.o. female contacted today regarding refills of specialty medication(s) Capecitabine  (XELODA )   Patient requested Delivery   Delivery date: 07/10/24   Verified address: [address]   [place] [address]   Medication will be filled on: 07/09/24

## 2024-07-02 NOTE — Progress Notes (Signed)
 [facility] CSW Progress Note  Clinical Child Psychotherapist assisting patient with support for adjustment to diagnosis and treatment. CSW frequently met with patient as she continued through treatment. Upcoming treatment scheduled TBD after scans are completed. CSW contacted patient via email with three options for OPT counselors to assist her children.     Follow Up Plan:  Patient does not have any other needs at this time. CSW continues to follow until treatment plan is determined.   Lizbeth Sprague, LCSW Clinical Social Worker [facility]

## 2024-07-09 ENCOUNTER — Other Ambulatory Visit: Payer: Self-pay

## 2024-07-10 ENCOUNTER — Ambulatory Visit (HOSPITAL_COMMUNITY)

## 2024-07-10 ENCOUNTER — Telehealth: Payer: Self-pay | Admitting: Oncology

## 2024-07-10 ENCOUNTER — Encounter: Payer: Self-pay | Admitting: Oncology

## 2024-07-10 NOTE — Telephone Encounter (Signed)
 Patient scheduled for IVF and injection on 07/19/2024. Patient is aware of date/time.

## 2024-07-11 ENCOUNTER — Ambulatory Visit (HOSPITAL_COMMUNITY)
Admission: RE | Admit: 2024-07-11 | Discharge: 2024-07-11 | Disposition: A | Discharge status: Home / Self Care | Source: Ambulatory Visit | Attending: Oncology | Admitting: Oncology

## 2024-07-11 DIAGNOSIS — C180 Malignant neoplasm of cecum: Secondary | ICD-10-CM | POA: Insufficient documentation

## 2024-07-11 MED ORDER — IOHEXOL 300 MG/ML  SOLN
100.0000 mL | Freq: Once | INTRAMUSCULAR | Status: AC | PRN
  Administered 2024-07-11: 100 mL via INTRAVENOUS

## 2024-07-11 MED ORDER — HEPARIN SOD (PORK) LOCK FLUSH 100 UNIT/ML IV SOLN
500.0000 [IU] | Freq: Once | INTRAVENOUS | Status: AC
  Administered 2024-07-11: 500 [IU] via INTRAVENOUS

## 2024-07-11 MED ORDER — IOHEXOL 9 MG/ML PO SOLN
500.0000 mL | ORAL | Status: AC
  Administered 2024-07-11 (×2): 500 mL via ORAL

## 2024-07-13 ENCOUNTER — Encounter: Payer: Self-pay | Admitting: Oncology

## 2024-07-17 ENCOUNTER — Inpatient Hospital Stay: Attending: Oncology

## 2024-07-17 DIAGNOSIS — Z5111 Encounter for antineoplastic chemotherapy: Secondary | ICD-10-CM | POA: Insufficient documentation

## 2024-07-17 DIAGNOSIS — G620 Drug-induced polyneuropathy: Secondary | ICD-10-CM | POA: Diagnosis not present

## 2024-07-17 DIAGNOSIS — D125 Benign neoplasm of sigmoid colon: Secondary | ICD-10-CM | POA: Diagnosis not present

## 2024-07-17 DIAGNOSIS — Z9049 Acquired absence of other specified parts of digestive tract: Secondary | ICD-10-CM | POA: Diagnosis not present

## 2024-07-17 DIAGNOSIS — Z5189 Encounter for other specified aftercare: Secondary | ICD-10-CM | POA: Insufficient documentation

## 2024-07-17 DIAGNOSIS — K5909 Other constipation: Secondary | ICD-10-CM | POA: Insufficient documentation

## 2024-07-17 DIAGNOSIS — K7689 Other specified diseases of liver: Secondary | ICD-10-CM | POA: Insufficient documentation

## 2024-07-17 DIAGNOSIS — Z882 Allergy status to sulfonamides status: Secondary | ICD-10-CM | POA: Diagnosis not present

## 2024-07-17 DIAGNOSIS — D259 Leiomyoma of uterus, unspecified: Secondary | ICD-10-CM | POA: Diagnosis not present

## 2024-07-17 DIAGNOSIS — C180 Malignant neoplasm of cecum: Secondary | ICD-10-CM | POA: Diagnosis present

## 2024-07-17 DIAGNOSIS — Z881 Allergy status to other antibiotic agents status: Secondary | ICD-10-CM | POA: Insufficient documentation

## 2024-07-17 DIAGNOSIS — Z888 Allergy status to other drugs, medicaments and biological substances status: Secondary | ICD-10-CM | POA: Insufficient documentation

## 2024-07-17 DIAGNOSIS — D701 Agranulocytosis secondary to cancer chemotherapy: Secondary | ICD-10-CM | POA: Diagnosis not present

## 2024-07-17 DIAGNOSIS — K5903 Drug induced constipation: Secondary | ICD-10-CM | POA: Diagnosis not present

## 2024-07-17 DIAGNOSIS — Z79899 Other long term (current) drug therapy: Secondary | ICD-10-CM | POA: Diagnosis not present

## 2024-07-17 DIAGNOSIS — T451X5A Adverse effect of antineoplastic and immunosuppressive drugs, initial encounter: Secondary | ICD-10-CM | POA: Insufficient documentation

## 2024-07-17 DIAGNOSIS — R633 Feeding difficulties, unspecified: Secondary | ICD-10-CM | POA: Diagnosis not present

## 2024-07-17 DIAGNOSIS — C19 Malignant neoplasm of rectosigmoid junction: Secondary | ICD-10-CM | POA: Insufficient documentation

## 2024-07-17 DIAGNOSIS — Z79630 Long term (current) use of alkylating agent: Secondary | ICD-10-CM | POA: Diagnosis not present

## 2024-07-17 DIAGNOSIS — J9811 Atelectasis: Secondary | ICD-10-CM | POA: Diagnosis not present

## 2024-07-17 DIAGNOSIS — J45909 Unspecified asthma, uncomplicated: Secondary | ICD-10-CM | POA: Insufficient documentation

## 2024-07-17 DIAGNOSIS — Z91013 Allergy to seafood: Secondary | ICD-10-CM | POA: Diagnosis not present

## 2024-07-17 DIAGNOSIS — Z91011 Allergy to milk products, unspecified: Secondary | ICD-10-CM | POA: Insufficient documentation

## 2024-07-17 DIAGNOSIS — M069 Rheumatoid arthritis, unspecified: Secondary | ICD-10-CM | POA: Insufficient documentation

## 2024-07-17 LAB — CBC WITH DIFFERENTIAL (CANCER CENTER ONLY)
Abs Immature Granulocytes: 0.01 K/uL (ref 0.00–0.07)
Basophils Absolute: 0 K/uL (ref 0.0–0.1)
Basophils Relative: 1 %
Eosinophils Absolute: 0.1 K/uL (ref 0.0–0.5)
Eosinophils Relative: 3 %
HCT: 30.2 % — ABNORMAL LOW (ref 36.0–46.0)
Hemoglobin: 10.5 g/dL — ABNORMAL LOW (ref 12.0–15.0)
Immature Granulocytes: 0 %
Lymphocytes Relative: 43 %
Lymphs Abs: 1.5 K/uL (ref 0.7–4.0)
MCH: 31.1 pg (ref 26.0–34.0)
MCHC: 34.8 g/dL (ref 30.0–36.0)
MCV: 89.3 fL (ref 80.0–100.0)
Monocytes Absolute: 0.4 K/uL (ref 0.1–1.0)
Monocytes Relative: 11 %
Neutro Abs: 1.5 K/uL — ABNORMAL LOW (ref 1.7–7.7)
Neutrophils Relative %: 42 %
Platelet Count: 214 K/uL (ref 150–400)
RBC: 3.38 MIL/uL — ABNORMAL LOW (ref 3.87–5.11)
RDW: 20.5 % — ABNORMAL HIGH (ref 11.5–15.5)
WBC Count: 3.5 K/uL — ABNORMAL LOW (ref 4.0–10.5)
nRBC: 0 % (ref 0.0–0.2)

## 2024-07-17 LAB — CMP (CANCER CENTER ONLY)
ALT: 28 U/L (ref 0–44)
AST: 34 U/L (ref 15–41)
Albumin: 4.3 g/dL (ref 3.5–5.0)
Alkaline Phosphatase: 52 U/L (ref 38–126)
Anion gap: 9 (ref 5–15)
BUN: 11 mg/dL (ref 6–20)
CO2: 27 mmol/L (ref 22–32)
Calcium: 9.4 mg/dL (ref 8.9–10.3)
Chloride: 104 mmol/L (ref 98–111)
Creatinine: 0.81 mg/dL (ref 0.44–1.00)
GFR, Estimated: >60 mL/min (ref >60)
Glucose, Bld: 85 mg/dL (ref 70–99)
Potassium: 4.1 mmol/L (ref 3.5–5.1)
Sodium: 140 mmol/L (ref 135–145)
Total Bilirubin: 1.1 mg/dL (ref 0.0–1.2)
Total Protein: 7.6 g/dL (ref 6.5–8.1)

## 2024-07-17 LAB — PREGNANCY, URINE: Preg Test, Ur: NEGATIVE

## 2024-07-18 ENCOUNTER — Inpatient Hospital Stay: Admitting: Dietician

## 2024-07-18 ENCOUNTER — Inpatient Hospital Stay: Attending: Oncology | Admitting: Oncology

## 2024-07-18 ENCOUNTER — Inpatient Hospital Stay (HOSPITAL_BASED_OUTPATIENT_CLINIC_OR_DEPARTMENT_OTHER): Admitting: Nurse Practitioner

## 2024-07-18 ENCOUNTER — Other Ambulatory Visit: Payer: Self-pay

## 2024-07-18 ENCOUNTER — Encounter: Payer: Self-pay | Admitting: Nurse Practitioner

## 2024-07-18 ENCOUNTER — Inpatient Hospital Stay

## 2024-07-18 VITALS — BP 108/72 | HR 84 | Temp 97.3°F | Resp 18 | Wt 166.0 lb

## 2024-07-18 DIAGNOSIS — R630 Anorexia: Secondary | ICD-10-CM

## 2024-07-18 DIAGNOSIS — K5903 Drug induced constipation: Secondary | ICD-10-CM

## 2024-07-18 DIAGNOSIS — C180 Malignant neoplasm of cecum: Secondary | ICD-10-CM

## 2024-07-18 DIAGNOSIS — M792 Neuralgia and neuritis, unspecified: Secondary | ICD-10-CM

## 2024-07-18 DIAGNOSIS — T451X5A Adverse effect of antineoplastic and immunosuppressive drugs, initial encounter: Secondary | ICD-10-CM

## 2024-07-18 DIAGNOSIS — Z515 Encounter for palliative care: Secondary | ICD-10-CM

## 2024-07-18 DIAGNOSIS — R634 Abnormal weight loss: Secondary | ICD-10-CM

## 2024-07-18 DIAGNOSIS — R530 Neoplastic (malignant) related fatigue: Secondary | ICD-10-CM

## 2024-07-18 DIAGNOSIS — G629 Polyneuropathy, unspecified: Secondary | ICD-10-CM

## 2024-07-18 DIAGNOSIS — M2550 Pain in unspecified joint: Secondary | ICD-10-CM | POA: Diagnosis not present

## 2024-07-18 MED ORDER — PALONOSETRON HCL INJECTION 0.25 MG/5ML
0.2500 mg | Freq: Once | INTRAVENOUS | Status: AC
  Administered 2024-07-18: 0.25 mg via INTRAVENOUS
  Filled 2024-07-18: qty 5

## 2024-07-18 MED ORDER — FAMOTIDINE IN NACL 20-0.9 MG/50ML-% IV SOLN
20.0000 mg | Freq: Once | INTRAVENOUS | Status: AC
  Administered 2024-07-18: 20 mg via INTRAVENOUS
  Filled 2024-07-18: qty 50

## 2024-07-18 MED ORDER — DEXTROSE 5 % IV SOLN: INTRAVENOUS | Status: DC

## 2024-07-18 MED ORDER — OXALIPLATIN CHEMO INJECTION 100 MG/20ML
65.0000 mg/m2 | Freq: Once | INTRAVENOUS | Status: AC
  Administered 2024-07-18: 115 mg via INTRAVENOUS
  Filled 2024-07-18: qty 3

## 2024-07-18 MED ORDER — DEXAMETHASONE SOD PHOSPHATE PF 10 MG/ML IJ SOLN
10.0000 mg | Freq: Once | INTRAMUSCULAR | Status: AC
  Administered 2024-07-18: 10 mg via INTRAVENOUS

## 2024-07-18 MED ORDER — DIPHENHYDRAMINE HCL 50 MG/ML IJ SOLN
25.0000 mg | Freq: Once | INTRAMUSCULAR | Status: AC
  Administered 2024-07-18: 25 mg via INTRAVENOUS
  Filled 2024-07-18: qty 1

## 2024-07-18 NOTE — Progress Notes (Signed)
 Palliative Medicine [facility]  [phone] [phone]   Name: [name] Date: 07/18/2024 MRN: [id number]  DOB: July 23, 1976  Patient Care Team: Sun, Vyvyan, MD as PCP - General (Family Medicine) Dasie Leonor CROME, MD as Consulting Physician (General Surgery) Autumn Millman, MD as Consulting Physician (Oncology) Mai Lynwood FALCON, MD as Consulting Physician (Rheumatology) Pickenpack-Cousar, Fannie SAILOR, NP as Nurse Practitioner (Hospice and Palliative Medicine) Silvano Valrie SQUIBB, RN as Registered Nurse    INTERVAL HISTORY: Sherry Abbott is a 48 y.o. 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  Palliative is seeing patient for symptom management and goals of care.   SOCIAL HISTORY:     reports that she has never smoked. She has never used smokeless tobacco. She reports that she does not drink alcohol and does not use drugs.  ADVANCE DIRECTIVES:  None on file   CODE STATUS: Full code  PAST MEDICAL HISTORY: Past Medical History:  Diagnosis Date   Allergic rhinitis    Allergic to food    blueberry   Allergic to shellfish    Asthma    Cancer (HCC)    Dermatographic urticaria    Hidradenitis suppurativa    Labral tear of right hip joint    surgery   Low back pain    Nausea and vomiting 01/17/2024   Other chronic allergic conjunctivitis    Polyarthralgia     ALLERGIES:  is allergic to betadine [povidone iodine], blueberry [vaccinium angustifolium], milk-related compounds, neosporin [neomycin-bacitracin zn-polymyx], povidone-iodine, shellfish allergy, sulfa antibiotics, adrucil  [fluorouracil ], neomycin, and other.  MEDICATIONS:  Current Outpatient Medications  Medication Sig Dispense Refill   B Complex Vitamins (VITAMIN B-COMPLEX) TABS Take 1 tablet by mouth daily  with supper.     bisacodyl  (DULCOLAX) 5 MG EC tablet Take 5 mg by mouth daily as needed for moderate constipation.     capecitabine  (XELODA ) 500 MG tablet Take 3 tablets (1500 mg) by mouth in the morning and 2 tablets (1000 mg) by mouth in the evening for 14 days on, followed by 7 days off. Repeat every 21 days.Take within 30 minutes after meals. 70 tablet 7   cetirizine  (ZYRTEC  ALLERGY) 10 MG tablet Take 1 tablet (10 mg total) by mouth at bedtime. 90 tablet 0   COLACE 100 MG capsule Take 200 mg by mouth in the morning and at bedtime.     dexamethasone  (DECADRON ) 4 MG tablet Take 2 tablets (8 mg total) by mouth daily. Start the day after chemotherapy for 2 days. Take with food. 30 tablet 1   diphenhydrAMINE  (BENADRYL ) 25 MG tablet Take 1 tablet (25 mg total) by mouth every 6 (six) hours as needed. 30 tablet 0   famotidine  (PEPCID ) 20 MG tablet Take 1 tablet (20 mg total) by mouth 2 (two) times daily. 30 tablet 0   fluticasone  (FLONASE ) 50 MCG/ACT nasal spray Place 2 sprays into both nostrils daily. 9.9 mL 0   lidocaine -prilocaine  (EMLA ) cream Apply to affected area once 30 g 3   mometasone (NASONEX) 50 MCG/ACT nasal spray Place 2 sprays into the nose daily as needed (Allergies).     montelukast  (SINGULAIR ) 10 MG tablet Take 10 mg by mouth at bedtime.     Multiple Vitamin (MULTIVITAMIN WITH MINERALS) TABS tablet Take 1 tablet by mouth daily.     OLANZapine  (ZYPREXA ) 5 MG tablet Take 1 tablet daily at bedtime  for 3 days.  May repeat another dose in the morning, if persistent nausea. 30 tablet 1   ondansetron  (ZOFRAN ) 8 MG tablet Take 1 tablet (8 mg total) by mouth every 8 (eight) hours as needed for nausea or vomiting. Start on the third day after chemotherapy. 30 tablet 1   oxyCODONE  (OXY IR/ROXICODONE ) 5 MG immediate release tablet Take 1 tablet (5 mg total) by mouth every 8 (eight) hours as needed for severe pain (pain score 7-10) (5 mg for pain score 4-6, 10 mg for pain score 7-10). 12 tablet 0    polyethylene glycol powder (GLYCOLAX /MIRALAX ) 17 GM/SCOOP powder Take 17 g by mouth daily as needed. 238 g 0   scopolamine  (TRANSDERM-SCOP) 1 MG/3DAYS Place 1 patch (1 mg total) onto the skin every 3 (three) days. 10 patch 12   TYLENOL  500 MG tablet Take 500-1,000 mg by mouth every 6 (six) hours as needed for mild pain (pain score 1-3), headache or fever.     Current Facility-Administered Medications  Medication Dose Route Frequency Provider Last Rate Last Admin   Fremanezumab -vfrm SOSY 225 mg  225 mg Subcutaneous Once Ahern, Antonia B, MD       Facility-Administered Medications Ordered in Other Visits  Medication Dose Route Frequency Provider Last Rate Last Admin   dextrose  5 % solution   Intravenous Continuous Pasam, Avinash, MD 10 mL/hr at 07/18/24 0943 New Bag at 07/18/24 0943   oxaliplatin  (ELOXATIN ) 115 mg in dextrose  5 % 500 mL chemo infusion  65 mg/m2 (Treatment Plan Recorded) Intravenous Once Pasam, Avinash, MD 131 mL/hr at 07/18/24 1051 115 mg at 07/18/24 1051    VITAL SIGNS: There were no vitals taken for this visit. There were no vitals filed for this visit.   Estimated body mass index is 27.62 kg/m as calculated from the following:   Height as of 06/22/24: 5' 5 (1.651 m).   Weight as of an earlier encounter on 07/18/24: 166 lb (75.3 kg).   PERFORMANCE STATUS (ECOG) : 1 - Symptomatic but completely ambulatory  Physical Exam General: NAD Cardiovascular: regular rate and rhythm Pulmonary: normal breathing pattern Extremities: no edema, no joint deformities Skin: no rashes Neurological: AAO x3  IMPRESSION: Discussed the use of AI scribe software for clinical note transcription with the patient, who gave verbal consent to proceed.  History of Present Illness Sherry Abbott is a 48 year old female with adenocarcinoma of the cecum who I saw during her infusion for symptom management follow-up. No acute distress. Denies uncontrolled nausea, vomiting, constipation,  or diarrhea. Family present. She is eating fruit and yogurt. Scopolamine  patch offering some relief.   She is experiencing significant difficulty eating, particularly after her recent treatments, which has impacted her ability to finish meals, including breakfast. She is attempting to manage this by ensuring she has something in her stomach. Weight is up to 166lbs. Patient is closely followed by Dietician.   Persistent constipation is present despite using prebiotics, Miralax , and stool softeners. Consuming chia pudding and pears has been more effective in managing her symptoms. We discussed use of prunes and or prune juice. We will plan to continue closely monitoring and consider other options as needed.   Mrs. Delorey shares her neuropathic symptoms are worsening specifically in fingertips. She experiences severe shooting pain in her hands and feet, which radiates up her legs following chemotherapy treatment. The pain is intense and not fully relieved by Korene, although it provides some improvement. She discussed with Dr. Autumn today and plans  are to consider starting gabapentin or Lyrica posttreatment.  Some hesitancy to start medication due to previous reactions.  She verbalized understanding. She is experiencing joint pain, particularly in her knees, which has led to a near fall last week. Joint pain contributed to oxaliplatin  use.  We discussed use of creams and topical CBD to support her symptoms currently.  She verbalized understanding.  All questions answered and support provided.   Assessment & Plan Chemotherapy-induced anorexia Appetite fluctuates, with decreased appetite during chemotherapy. Eating on a schedule helps manage symptoms. - Continue scheduled meals to maintain nutritional intake.  Chemotherapy-induced peripheral neuropathy Peripheral neuropathy with numbness in the fingertip, likely secondary to chemotherapy. Symptoms include lack of sensation in the fingertip. Gabapentin  or Lyrica may be initiated post-treatment to manage symptoms. CBD creams are recommended for topical relief of neuropathy and joint pain. - Will initiate gabapentin or Lyrica post-treatment with hesitancy due to dyskinesia symptoms previously.  - Consider CBD creams for topical relief of neuropathy and joint pain  Chemotherapy-induced constipation Persistent constipation despite use of prebiotics, probiotics, chia pudding, Miralax , and stool softeners. Dietary modifications with fruits like prunes and cranberries are suggested to aid bowel movements. - Continue current regimen of prebiotics, probiotics, chia pudding, Miralax , and stool softeners - Incorporate prunes and cranberries into diet to aid bowel movements  Chemotherapy-induced joint pain Joint pain, particularly in the knees, possibly related to oxaliplatin . Symptoms include pain and near-fall incidents. Gabapentin or Lyrica may be initiated post-treatment to manage symptoms. CBD creams are recommended for topical relief of joint pain. - Will initiate gabapentin or Lyrica post-treatment - Consider CBD creams for topical relief of joint pain  Patient expressed understanding and was in agreement with this plan. She also understands that She can call the clinic at any time with any questions, concerns, or complaints.   I will plan to see patient back in 3-4 weeks. Sooner if needed.   Any controlled substances utilized were prescribed in the context of palliative care. PDMP has been reviewed.   Visit consisted of counseling and education dealing with the complex and emotionally intense issues of symptom management and palliative care in the setting of serious and potentially life-threatening illness.  Levon Borer, AGPCNP-BC  Palliative Medicine Team/[facility]

## 2024-07-18 NOTE — Progress Notes (Signed)
 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 S/p hemicolectomy on 6/12. Started adjuvant folfox 7/9. 5-FU discontinued due to toxicity. Patient currently receiving oxaliplatin  + capecitabine  (Xelox) q21d. Patient is under the care of Dr. Autumn.   Met with patient in infusion. Today is her final infusion. She is feeling pretty good overall. Appetite is the same. Eats better on her week off. Continues to have constipation, however bowel regimen plus the addition of chia pudding has been helpful. Scopolamine  patch working well for nausea. Patient glad to share that recent imaging is clear.    Medications: reviewed   Labs: reviewed   Anthropometrics: Wt 166 lb today - increased   11/14 - 162 lb 12.8 oz  10/28 - 159 lb 8 oz  9/12 - 160 lb 3.2 oz    NUTRITION DIAGNOSIS: Altered GI function improving    INTERVENTION:  Congratulated pt on final treatment Continue bowel regimen + chia pudding for constipation   MONITORING, EVALUATION, GOAL: wt trends, intake   NEXT VISIT: No follow-up scheduled. Patient has contact information. Encouraged to call with nutrition questions/concerns

## 2024-07-18 NOTE — Progress Notes (Signed)
 [facility]  ONCOLOGY CLINIC PROGRESS NOTE   Patient Care Team: Sun, Vyvyan, MD as PCP - General (Family Medicine) Dasie Leonor CROME, MD as Consulting Physician (General Surgery) Autumn Millman, MD as Consulting Physician (Oncology) Mai Lynwood FALCON, MD as Consulting Physician (Rheumatology) Pickenpack-Cousar, Fannie SAILOR, NP as Nurse Practitioner (Hospice and Palliative Medicine) Silvano Valrie SQUIBB, RN as Registered Nurse  PATIENT NAME: Sherry Abbott   MR#: [id number] DOB: May 31, 1976  Date of visit: 07/18/2024   ASSESSMENT & PLAN:   Sherry Abbott is a 48 y.o. 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  pT3, pN1a, cM0, stage III B disease.  MMR proficient.  Side effects to 5-FU infusion with jaw clenching, muscle spasms of the tongue and generalized muscle twitching.  Adenocarcinoma of cecum [facility]) Please review oncology history for additional details and timeline of events.    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 MMR proficient.   Previously I discussed diagnosis, staging, prognosis, plan of care, treatment options.  Reviewed NCCN guidelines.  Patient had allergic reaction to FOLFOX regimen, likely to 5-FU pump. It could not be continued any longer.   Patient was agreeable to proceeding with capecitabine  and she started that from 03/29/2024.  Dose was reduced to avoid toxicity/risk of reaction.  Calculated dose is 1500 mg p.o. every morning and 1000 mg p.o. nightly for 2 weeks on, 1 week off.  Will plan to do oxaliplatin  at half-strength at a slower rate with each cycle.  She started oxaliplatin  from 03/30/2024.    During cycle 3 of CapeOx, because of  progressive side effects including nausea, Xeloda  dose was decreased to 1000 mg twice daily.  She is tolerating this better.  She is tolerating treatment reasonably well so far, but has been experiencing grade 2 hand-foot syndrome, nausea, fatigue.    Restaging CT chest, abdomen and pelvis on 07/11/2024 showed no evidence of disease.  Due to start cycle 6 of CapeOx today.  Labs reveal leukopenia with white count of 3500, ANC is 1500.  Will proceed with oxaliplatin  today.  She will start cycle 6 of capecitabine  from today.  Because of leukopenia/neutropenia, she will receive Neulasta  tomorrow.  This will complete the planned course of chemotherapy since she is completing 6 cycles of CapeOx.  Going forward, we will continue surveillance as per NCCN guidelines.  - She was previously referred to genetic counseling for genetic testing.  - Monitor with circulating tumor DNA test and CT scans every three months for two years.  Circulating tumor DNA test was negative on 02/06/2024 and on 05/11/2024.   -We will schedule colonoscopy one year post-surgery.  I will plan to see her approximately in 6 weeks for follow-up with repeat labs.  We will order restaging imaging on future visits.   Chemotherapy-induced nausea and vomiting Nausea better controlled with scopolamine  patch and Zofran . Pepcid  used as needed. Plan to use Zyprexa  for three days starting tonight. - Continue scopolamine  patch and Zofran  as needed - Use Pepcid  one to two times a day - Use Zyprexa  for three days starting tonight  Chronic constipation Experiencing constipation, improved with dietary changes including apples, pears, and chia seeds. Bowel movements increased to 3-4 times a week. - Continue dietary modifications with  apples, pears, and chia seeds.  Chemotherapy-induced hand-foot syndrome Experiencing hand-foot syndrome, with feet more affected than hands. Peeling and sore spots on feet, causing difficulty walking. Using  Korene armin Sang for symptom management. - Continue using Korene and Udderly for hand-foot syndrome management.  Chemotherapy-induced neutropenia Neutrophil count stable at 1500, white count 3500.  Hemoglobin and platelets are stable. -Will proceed with Neulasta  injection tomorrow, 07/19/2024  Chemotherapy-induced cold sensitivity and neuropathy Cold sensitivity persists, requiring warm fluids post-treatment. Tingling and numbness in hands and feet expected to take longer to resolve post-treatment. - Advise continuation of warm fluid intake to manage cold sensitivity. - Educate that neuropathy may take 3-4 weeks post-treatment to improve, with full resolution taking longer. Experiencing tingling and numbness, likely related to oxaliplatin . Previous trials of Lyrica and gabapentin were ineffective or caused drowsiness. Plan to reassess after completion of chemotherapy. 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 Capecitabine  is proposed as an alternative due to its different side effect profile. - Discontinue 5-FU due to severe allergic reaction. - Monitor for any allergic reactions to capecitabine .  I reviewed lab results and outside records for this visit and discussed relevant results with the patient. Diagnosis, plan of care and treatment options were also discussed in detail with the patient. Opportunity provided to ask questions and answers provided to her apparent satisfaction. Provided instructions to call our clinic with any problems, questions or concerns prior to return visit. I recommended to continue follow-up with PCP and sub-specialists. She verbalized understanding and  agreed with the plan.   NCCN guidelines have been consulted in the planning of this patients care.  I spent a total of 42 minutes during this encounter with the patient including review of chart and various tests results, discussions about plan of care and coordination of care plan.   Chinita Patten, MD  07/18/2024 9:26 AM  [facility] - [facility] [address] [place] [address] Dept: [phone] Dept Fax: [phone]    CHIEF COMPLAINT/ REASON FOR VISIT:   Invasive adenocarcinoma of the cecum, status post hemicolectomy on 01/19/2024.  pT3, pN1a, cM0, stage III B disease.  MMR proficient.   Current Treatment: Adjuvant systemic chemotherapy with FOLFOX started from 02/15/2024.  However she had bad reaction to presumably 5-FU.  Treatment switched to XELOX from 03/29/2024.  INTERVAL HISTORY:    Discussed the use of AI scribe software for clinical note transcription with the patient, who gave verbal consent to proceed.  History of Present Illness  Sherry Abbott is a 48 year old female undergoing chemotherapy who presents with increased joint and back pain.  She is experiencing increased joint and back pain, which she attributes to her ongoing chemotherapy treatments. The pain has intensified over time, and she is currently on her sixth and final planned chemotherapy session.  She has skin issues, particularly difficulty in keeping her skin moisturized. She uses a moisturizer called 'Udderly' but finds that applying it immediately after a shower causes her skin to peel and stick to her socks. To avoid this, she waits until the next morning to apply it.  She experiences constipation, which she believes is exacerbated by capsaicin  slowing down her digestive system. Despite trying various remedies, the constipation persists.  Nausea has been a concern, but it is now better controlled with a  scopolamine  patch, Zofran  every eight hours, and Pepcid  one to two times a day.  She reports neuropathy symptoms, including tingling and numbness, but is not currently on medication for this due to previous allergies and side effects. She has tried Lyrica and gabapentin in the past, with gabapentin causing sleepiness, which was managed by taking it at bedtime.  She is concerned about her immune system and has been avoiding community activities such as attending church in person. She is planning to return to work in the spring semester and has moved to a more secluded office to minimize contact with others.  I have reviewed the past medical history, past surgical history, social history and family history with the patient and they are unchanged from previous note.  HISTORY OF PRESENT ILLNESS:   ONCOLOGY HISTORY:   Patient had routine screening colonoscopy on 01/12/2024, performed by Dr. Saintclair.  It showed a fungating, infiltrative, sessile, polypoid and ulcerated nonobstructing large mass in the cecum and in the ascending colon.  The mass was partially circumferential involving two thirds of the lumen circumference, measuring at least 7 cm in length.  Biopsies were obtained.  3 sessile polyps were found in the transverse colon, descending colon and sigmoid colon measuring up to 6 mm, removed.  Pathology from the cecal mass came back positive for invasive moderately differentiated adenocarcinoma.  MMR proficient.   On 01/16/2024, CT abdomen and pelvis showed 2.8 cm cyst in the left hepatic lobe, simple appearing.  No pathologic lymphadenopathy or other acute findings were noted.   She was referred for surgical oncology evaluation.  She was supposed to see Dr. Ann on 01/17/2024 but presented to the ED that same day with complaints of worsening right lower quadrant abdominal pain, nausea, vomiting and inability to tolerate oral intake.  She was admitted to the hospital for further evaluation and  management.   She was seen by Dr. Dasie in the hospital and she underwent laparoscopic-assisted right hemicolectomy, excision of peritoneal cyst on 01/19/2024.  Final pathology showed invasive moderately differentiated adenocarcinoma with focal mucinous differentiation arising within a tubulovillous adenoma, measuring 6.7 cm in greatest dimension with involvement of submucosa, muscularis propria and into pericolonic adipose tissue.  1 out of 26 lymph nodes were positive for malignancy.  There was another single positive tumor deposit.  All margins were negative.  Grade 2.  No evidence of LVI or PNI.  MMR proficient.     On her consultation with us  on 01/25/2024, request placed for CT of the chest for staging.  On 02/07/2024, staging CT of the chest showed no evidence of intrathoracic metastatic disease.  pT3,pN1a, cM0, Stage III B disease.    Plan made for adjuvant chemotherapy with modified FOLFOX-6, every 2 weeks for up to 12 cycles.  Started cycle 1 on 02/15/2024.  Initially it seemed as if she has suffered an allergic reaction to oxaliplatin  with tongue muscle twitching, difficulty speaking, and jaw muscle tightness, the day after chemo.  Discussed alternatives.  Plan made to proceed with reduced dose oxaliplatin  at 50% at a slower rate over 3 hours with extra allergy medicines including Benadryl  and Pepcid .   She received cycle 2 of FOLFOX without 5-FU pump and dose reduced oxaliplatin  at a slower rate on 02/27/2024.  On 02/28/2024, she had to present to the ED again with similar complaints  of tongue muscle twitching, jaw clenching etc.  Symptoms probably resolved after stopping 5-FU pump in the ED.  It is now clear that she has allergic reaction to 5-FU infusion.  We cannot use it anymore because of the severity of reaction.  Patient willing to try capecitabine  with oxaliplatin .  If she were to suffer similar side effects, then we will have to hold chemotherapy and continue close surveillance with  Guardant reveal and CT imaging.  Started Capecitabine  from 03/29/2024. Oxaliplatin  started from 03/30/24.  Tolerating this regimen well so far.  Guardant reveal test in October 2025 showed no evidence of circulating tumor DNA.  Restaging CT chest, abdomen and pelvis on 07/11/2024 showed no evidence of disease.  Completed 6 cycles of CapeOx as of 07/18/2024.  Plan to continue surveillance as per NCCN guidelines.   Oncology History  Adenocarcinoma of cecum (HCC)  01/17/2024 Initial Diagnosis   Adenocarcinoma of cecum (HCC)   01/25/2024 Cancer Staging   Staging form: Colon and Rectum, AJCC 8th Edition - Pathologic: Stage IIIB (pT3, pN1a, cM0) - Signed by Autumn Millman, MD on 02/15/2024 Total positive nodes: 1 Histologic grading system: 4 grade system Histologic grade (G): G2 Residual tumor (R): R0   02/15/2024 - 02/29/2024 Chemotherapy   Patient is on Treatment Plan : COLORECTAL FOLFOX q14d x 3 months     03/30/2024 -  Chemotherapy   Patient is on Treatment Plan : COLORECTAL Xelox (Capeox)(130/850) q21d         REVIEW OF SYSTEMS:   Review of Systems - Oncology  All other pertinent systems were reviewed with the patient and are negative.  ALLERGIES: She is allergic to betadine [povidone iodine], blueberry [vaccinium angustifolium], milk-related compounds, neosporin [neomycin-bacitracin zn-polymyx], povidone-iodine, shellfish allergy, sulfa antibiotics, adrucil  [fluorouracil ], neomycin, and other.  MEDICATIONS:  Current Outpatient Medications  Medication Sig Dispense Refill   B Complex Vitamins (VITAMIN B-COMPLEX) TABS Take 1 tablet by mouth daily with supper.     bisacodyl  (DULCOLAX) 5 MG EC tablet Take 5 mg by mouth daily as needed for moderate constipation.     capecitabine  (XELODA ) 500 MG tablet Take 3 tablets (1500 mg) by mouth in the morning and 2 tablets (1000 mg) by mouth in the evening for 14 days on, followed by 7 days off. Repeat every 21 days.Take within 30 minutes after  meals. 70 tablet 7   cetirizine  (ZYRTEC  ALLERGY) 10 MG tablet Take 1 tablet (10 mg total) by mouth at bedtime. 90 tablet 0   COLACE 100 MG capsule Take 200 mg by mouth in the morning and at bedtime.     dexamethasone  (DECADRON ) 4 MG tablet Take 2 tablets (8 mg total) by mouth daily. Start the day after chemotherapy for 2 days. Take with food. 30 tablet 1   diphenhydrAMINE  (BENADRYL ) 25 MG tablet Take 1 tablet (25 mg total) by mouth every 6 (six) hours as needed. 30 tablet 0   famotidine  (PEPCID ) 20 MG tablet Take 1 tablet (20 mg total) by mouth 2 (two) times daily. 30 tablet 0   fluticasone  (FLONASE ) 50 MCG/ACT nasal spray Place 2 sprays into both nostrils daily. 9.9 mL 0   lidocaine -prilocaine  (EMLA ) cream Apply to affected area once 30 g 3   mometasone (NASONEX) 50 MCG/ACT nasal spray Place 2 sprays into the nose daily as needed (Allergies).     montelukast  (SINGULAIR ) 10 MG tablet Take 10 mg by mouth at bedtime.     Multiple Vitamin (MULTIVITAMIN WITH MINERALS) TABS tablet Take 1 tablet by  mouth daily.     OLANZapine  (ZYPREXA ) 5 MG tablet Take 1 tablet daily at bedtime for 3 days.  May repeat another dose in the morning, if persistent nausea. 30 tablet 1   ondansetron  (ZOFRAN ) 8 MG tablet Take 1 tablet (8 mg total) by mouth every 8 (eight) hours as needed for nausea or vomiting. Start on the third day after chemotherapy. 30 tablet 1   oxyCODONE  (OXY IR/ROXICODONE ) 5 MG immediate release tablet Take 1 tablet (5 mg total) by mouth every 8 (eight) hours as needed for severe pain (pain score 7-10) (5 mg for pain score 4-6, 10 mg for pain score 7-10). 12 tablet 0   polyethylene glycol powder (GLYCOLAX /MIRALAX ) 17 GM/SCOOP powder Take 17 g by mouth daily as needed. 238 g 0   scopolamine  (TRANSDERM-SCOP) 1 MG/3DAYS Place 1 patch (1 mg total) onto the skin every 3 (three) days. 10 patch 12   TYLENOL  500 MG tablet Take 500-1,000 mg by mouth every 6 (six) hours as needed for mild pain (pain score 1-3),  headache or fever.     Current Facility-Administered Medications  Medication Dose Route Frequency Provider Last Rate Last Admin   Fremanezumab -vfrm SOSY 225 mg  225 mg Subcutaneous Once Ines Onetha NOVAK, MD         VITALS:   Blood pressure 108/72, pulse 84, temperature (!) 97.3 F (36.3 C), temperature source Temporal, resp. rate 18, weight 166 lb (75.3 kg), SpO2 99%.  Wt Readings from Last 3 Encounters:  07/18/24 166 lb (75.3 kg)  06/22/24 162 lb 12.8 oz (73.8 kg)  06/19/24 163 lb (73.9 kg)    Body mass index is 27.62 kg/m.    Onc Performance Status - 07/18/24 0906       ECOG Perf Status   ECOG Perf Status Restricted in physically strenuous activity but ambulatory and able to carry out work of a light or sedentary nature, e.g., light house work, office work      KPS SCALE   KPS % SCORE Normal activity with effort, some s/s of disease            PHYSICAL EXAM:   Physical Exam Constitutional:      General: She is not in acute distress.    Appearance: Normal appearance.  HENT:     Head: Normocephalic and atraumatic.  Cardiovascular:     Rate and Rhythm: Normal rate.  Pulmonary:     Effort: Pulmonary effort is normal. No respiratory distress.  Chest:     Comments: Right-sided Port-A-Cath in place without any signs of infection Abdominal:     General: There is no distension.  Neurological:     General: No focal deficit present.     Mental Status: She is alert and oriented to person, place, and time.  Psychiatric:        Mood and Affect: Mood normal.        Behavior: Behavior normal.       LABORATORY DATA:   I have reviewed the data as listed.  No results found for any visits on 07/18/24.  Results for orders placed or performed in visit on 07/17/24 (from the past 72 hours)  CBC with Differential (Cancer Center Only)     Status: Abnormal   Collection Time: 07/17/24  1:16 PM  Result Value Ref Range   WBC Count 3.5 (L) 4.0 - 10.5 K/uL   RBC 3.38 (L)  3.87 - 5.11 MIL/uL   Hemoglobin 10.5 (L) 12.0 - 15.0 g/dL  HCT 30.2 (L) 36.0 - 46.0 %   MCV 89.3 80.0 - 100.0 fL   MCH 31.1 26.0 - 34.0 pg   MCHC 34.8 30.0 - 36.0 g/dL   RDW 79.4 (H) 88.4 - 84.4 %   Platelet Count 214 150 - 400 K/uL   nRBC 0.0 0.0 - 0.2 %   Neutrophils Relative % 42 %   Neutro Abs 1.5 (L) 1.7 - 7.7 K/uL   Lymphocytes Relative 43 %   Lymphs Abs 1.5 0.7 - 4.0 K/uL   Monocytes Relative 11 %   Monocytes Absolute 0.4 0.1 - 1.0 K/uL   Eosinophils Relative 3 %   Eosinophils Absolute 0.1 0.0 - 0.5 K/uL   Basophils Relative 1 %   Basophils Absolute 0.0 0.0 - 0.1 K/uL   Immature Granulocytes 0 %   Abs Immature Granulocytes 0.01 0.00 - 0.07 K/uL    Comment: Performed at [facility], [address] [place], [place] [address]  CMP (Cancer Center only)     Status: None   Collection Time: 07/17/24  1:16 PM  Result Value Ref Range   Sodium 140 135 - 145 mmol/L   Potassium 4.1 3.5 - 5.1 mmol/L   Chloride 104 98 - 111 mmol/L   CO2 27 22 - 32 mmol/L   Glucose, Bld 85 70 - 99 mg/dL    Comment: Glucose reference range applies only to samples taken after fasting for at least 8 hours.   BUN 11 6 - 20 mg/dL   Creatinine 9.18 9.55 - 1.00 mg/dL   Calcium  9.4 8.9 - 10.3 mg/dL   Total Protein 7.6 6.5 - 8.1 g/dL   Albumin  4.3 3.5 - 5.0 g/dL   AST 34 15 - 41 U/L   ALT 28 0 - 44 U/L   Alkaline Phosphatase 52 38 - 126 U/L   Total Bilirubin 1.1 0.0 - 1.2 mg/dL   GFR, Estimated >39 >39 mL/min    Comment: (NOTE) Calculated using the CKD-EPI Creatinine Equation (2021)    Anion gap 9 5 - 15    Comment: Performed at [facility], [address], [place], [place] [address]  Pregnancy, urine     Status: None   Collection Time: 07/17/24  1:16 PM  Result Value Ref Range   Preg Test, Ur NEGATIVE NEGATIVE    Comment:        THE SENSITIVITY OF THIS METHODOLOGY IS >20 mIU/mL. Performed at [facility], [address], [place], [place] [address]      RADIOGRAPHIC STUDIES:  CT CHEST ABDOMEN PELVIS W CONTRAST Result Date: 07/17/2024 CLINICAL DATA:  Colorectal cancer, restaging.  * Tracking Code: BO * EXAM: CT CHEST, ABDOMEN, AND PELVIS WITH CONTRAST TECHNIQUE: Multidetector CT imaging of the chest, abdomen and pelvis was performed following the standard protocol during bolus administration of intravenous contrast. RADIATION DOSE REDUCTION: This exam was performed according to the departmental dose-optimization program which includes automated exposure control, adjustment of the mA and/or kV according to patient size and/or use of iterative reconstruction technique. CONTRAST:  OMNIPAQUE  IOHEXOL  300 MG/ML  SOLN COMPARISON:  Multiple priors including CT February 07, 2024 and March 06, 2024 FINDINGS: CT CHEST FINDINGS Cardiovascular: Accessed right chest Port-A-Cath with tip at the superior cavoatrial junction. Normal caliber thoracic aorta. Normal size heart. No significant pericardial effusion/thickening. Mediastinum/Nodes: No suspicious thyroid nodule. No supraclavicular adenopathy. No pathologically enlarged mediastinal, hilar or axillary lymph nodes. The esophagus is grossly unremarkable.  Lungs/Pleura: Stable 6 mm right lower lobe calcified pulmonary nodule stable dating back to 2009. No new suspicious pulmonary nodules or masses. Central airways are patent. No pleural effusion. No pneumothorax. Bibasilar atelectasis/scarring. Musculoskeletal: No aggressive lytic or blastic lesion of bone. CT ABDOMEN PELVIS FINDINGS Hepatobiliary: No suspicious hepatic lesion. Gallbladder is unremarkable. No biliary ductal dilation. Pancreas: No pancreatic ductal dilation or evidence of acute inflammation. Spleen: No splenomegaly or focal splenic lesion. Adrenals/Urinary Tract: No suspicious adrenal nodule/mass. No hydronephrosis. Kidneys demonstrate symmetric enhancement. Urinary bladder is unremarkable for degree of distension.  Stomach/Bowel: Prior right hemicolectomy with ileocolic anastomosis common no suspicious soft tissue nodularity along the suture line. Colonic stool burden compatible with constipation. Radiopaque enteric contrast material traverses distal loops of small bowel. No evidence of bowel obstruction. Vascular/Lymphatic: Normal caliber abdominal aorta. Smooth IVC contours. The portal, splenic and superior mesenteric veins are patent. No pathologically enlarged abdominal or pelvic lymph nodes. Reproductive: Small fundal fibroid. Other: No significant abdominopelvic free fluid. Musculoskeletal: No aggressive lytic or blastic lesion of bone. IMPRESSION: 1. Prior right hemicolectomy with ileocolic anastomosis. No evidence of local recurrence. 2. No evidence of metastatic disease within the chest, abdomen or pelvis. 3. Colonic stool burden compatible with constipation. Electronically Signed   By: Reyes Holder M.D.   On: 07/17/2024 07:05     CODE STATUS:  Code Status History     Date Active Date Inactive Code Status Order ID Comments User Context   01/17/2024 0459 01/23/2024 1727 Full Code [id number]  Alfornia Madison, MD ED    Questions for Most Recent Historical Code Status (Order [id number])     Question Answer   By: Consent: discussion documented in EHR            Orders Placed This Encounter  Procedures   CBC with Differential (Cancer Center Only)    Standing Status:   Future    Expiration Date:   07/18/2025   CMP (Cancer Center only)    Standing Status:   Future    Expiration Date:   07/18/2025   CEA (Access)    Standing Status:   Future    Expiration Date:   07/18/2025   Guardant Reveal    Standing Status:   Future    Expiration Date:   07/18/2025     Future Appointments  Date Time Provider Department Center  07/19/2024  1:45 PM [facility] None      This document was completed utilizing speech recognition software. Grammatical errors, random word  insertions, pronoun errors, and incomplete sentences are an occasional consequence of this system due to software limitations, ambient noise, and hardware issues. Any formal questions or concerns about the content, text or information contained within the body of this dictation should be directly addressed to the provider for clarification.

## 2024-07-18 NOTE — Patient Instructions (Signed)
 [facility] - A DEPT OF [facility]  Discharge Instructions: Thank you for choosing [facility] to provide your oncology and hematology care.   If you have a lab appointment with the Cancer Center, please go directly to the Cancer Center and check in at the registration area.   Wear comfortable clothing and clothing appropriate for easy access to any Portacath or PICC line.   We strive to give you quality time with your provider. You may need to reschedule your appointment if you arrive late (15 or more minutes).  Arriving late affects you and other patients whose appointments are after yours.  Also, if you miss three or more appointments without notifying the office, you may be dismissed from the clinic at the provider's discretion.      For prescription refill requests, have your pharmacy contact our office and allow 72 hours for refills to be completed.    Today you received the following chemotherapy and/or immunotherapy agents: Oxaliplatin      To help prevent nausea and vomiting after your treatment, we encourage you to take your nausea medication as directed.  BELOW ARE SYMPTOMS THAT SHOULD BE REPORTED IMMEDIATELY: *FEVER GREATER THAN 100.4 F (38 C) OR HIGHER *CHILLS OR SWEATING *NAUSEA AND VOMITING THAT IS NOT CONTROLLED WITH YOUR NAUSEA MEDICATION *UNUSUAL SHORTNESS OF BREATH *UNUSUAL BRUISING OR BLEEDING *URINARY PROBLEMS (pain or burning when urinating, or frequent urination) *BOWEL PROBLEMS (unusual diarrhea, constipation, pain near the anus) TENDERNESS IN MOUTH AND THROAT WITH OR WITHOUT PRESENCE OF ULCERS (sore throat, sores in mouth, or a toothache) UNUSUAL RASH, SWELLING OR PAIN  UNUSUAL VAGINAL DISCHARGE OR ITCHING   Items with * indicate a potential emergency and should be followed up as soon as possible or go to the Emergency Department if any problems should occur.  Please show the CHEMOTHERAPY ALERT CARD or  IMMUNOTHERAPY ALERT CARD at check-in to the Emergency Department and triage nurse.  Should you have questions after your visit or need to cancel or reschedule your appointment, please contact [facility]  Dept: [phone]  and follow the prompts.  Office hours are 8:00 a.m. to 4:30 p.m. Monday - Friday. Please note that voicemails left after 4:00 p.m. may not be returned until the following business day.  We are closed weekends and major holidays. You have access to a nurse at all times for urgent questions. Please call the main number to the clinic Dept: [phone] and follow the prompts.   For any non-urgent questions, you may also contact your provider using MyChart. We now offer e-Visits for anyone 36 and older to request care online for non-urgent symptoms. For details visit [email or web address].   Also download the MyChart app! Go to the app store, search "MyChart", open the app, select [place], and log in with your MyChart username and password.

## 2024-07-19 ENCOUNTER — Inpatient Hospital Stay

## 2024-07-19 ENCOUNTER — Encounter: Payer: Self-pay | Admitting: Oncology

## 2024-07-19 VITALS — BP 112/71 | HR 70 | Temp 98.4°F | Resp 18

## 2024-07-19 DIAGNOSIS — C180 Malignant neoplasm of cecum: Secondary | ICD-10-CM

## 2024-07-19 DIAGNOSIS — Z95828 Presence of other vascular implants and grafts: Secondary | ICD-10-CM

## 2024-07-19 MED ORDER — PEGFILGRASTIM-CBQV 6 MG/0.6ML ~~LOC~~ SOSY
6.0000 mg | PREFILLED_SYRINGE | Freq: Once | SUBCUTANEOUS | Status: AC
  Administered 2024-07-19: 6 mg via SUBCUTANEOUS
  Filled 2024-07-19: qty 0.6

## 2024-07-19 MED ORDER — SODIUM CHLORIDE 0.9 % IV SOLN
25.0000 mg | Freq: Once | INTRAVENOUS | Status: AC
  Administered 2024-07-19: 25 mg via INTRAVENOUS
  Filled 2024-07-19: qty 1

## 2024-07-19 MED ORDER — SODIUM CHLORIDE 0.9 % IV SOLN: Freq: Once | INTRAVENOUS | Status: AC

## 2024-07-19 NOTE — Patient Instructions (Signed)

## 2024-07-19 NOTE — Assessment & Plan Note (Addendum)
 Please review oncology history for additional details and timeline of events.    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 MMR proficient.   Previously I discussed diagnosis, staging, prognosis, plan of care, treatment options.  Reviewed NCCN guidelines.  Patient had allergic reaction to FOLFOX regimen, likely to 5-FU pump. It could not be continued any longer.   Patient was agreeable to proceeding with capecitabine  and she started that from 03/29/2024.  Dose was reduced to avoid toxicity/risk of reaction.  Calculated dose is 1500 mg p.o. every morning and 1000 mg p.o. nightly for 2 weeks on, 1 week off.  Will plan to do oxaliplatin  at half-strength at a slower rate with each cycle.  She started oxaliplatin  from 03/30/2024.    During cycle 3 of CapeOx, because of progressive side effects including nausea, Xeloda  dose was decreased to 1000 mg twice daily.  She is tolerating this better.  She is tolerating treatment reasonably well so far, but has been experiencing grade 2 hand-foot syndrome, nausea, fatigue.    Restaging CT chest, abdomen and pelvis on 07/11/2024 showed no evidence of disease.  Due to start cycle 6 of CapeOx today.  Labs reveal leukopenia with white count of 3500, ANC is 1500.  Will proceed with oxaliplatin  today.  She will start cycle 6 of capecitabine  from today.  Because of leukopenia/neutropenia, she will receive Neulasta  tomorrow.  This will complete the planned course of chemotherapy since she is completing 6 cycles of CapeOx.  Going forward, we will continue surveillance as per NCCN guidelines.  - She was previously referred to genetic counseling for genetic testing.  - Monitor with circulating tumor DNA test and CT scans every three months for two years.  Circulating tumor DNA test was negative on 02/06/2024 and on 05/11/2024.   -We will schedule colonoscopy one year post-surgery.  I will plan to see her  approximately in 6 weeks for follow-up with repeat labs.  We will order restaging imaging on future visits.

## 2024-07-23 ENCOUNTER — Other Ambulatory Visit: Payer: Self-pay

## 2024-07-24 ENCOUNTER — Encounter: Payer: Self-pay | Admitting: Oncology

## 2024-07-25 ENCOUNTER — Other Ambulatory Visit: Payer: Self-pay

## 2024-07-25 NOTE — Progress Notes (Signed)
 Disenrolled- patient has completed xeloda  therapy + infusion therapy and will be monitored by onco team per 12.10.25 chart notes. Confirmed with [email or web address].

## 2024-08-11 ENCOUNTER — Encounter: Payer: Self-pay | Admitting: Oncology

## 2024-08-13 ENCOUNTER — Telehealth: Payer: Self-pay

## 2024-08-13 NOTE — Telephone Encounter (Signed)
 Followed up on patient's MyChart message:  Since December 23rd, I have been having increased pain and swelling in my knees that combined with hand-foot syndrome make it difficult to bend my knees, walk or stand at times.  I thought I was over doing things with excitement from the holidays. However, my symptoms do not improve much with rest, reduced activity and Tylenol . Is this common after stopping capecitabine ? How should I manage this knee pain and lack of mobility? Should I contact Nikki from palliative care?   Thank you,  Sherry Abbott  This information was shared with Dr. Autumn with the following response that was shared with the patient: I am not sure why she is having these symptoms, since she completed chemotherapy and knee pain is not typical with oral chemotherapy that she received. We can either direct her to her PCP or if she is willing to discuss with Levon, we can arrange appointments accordingly.  Patient choose to meet with Palliative Care. Patient already has met with Palliative Care in the past. Reached out to Palliative Care on patient's behalf.

## 2024-08-14 ENCOUNTER — Telehealth: Payer: Self-pay | Admitting: Nurse Practitioner

## 2024-08-14 NOTE — Telephone Encounter (Signed)
 I left voicemail for patient regarding 30 min palliative care appointment on 08/22/2024 per secure chat.

## 2024-08-17 ENCOUNTER — Other Ambulatory Visit: Payer: Self-pay | Admitting: Oncology

## 2024-08-17 DIAGNOSIS — C180 Malignant neoplasm of cecum: Secondary | ICD-10-CM

## 2024-08-22 ENCOUNTER — Inpatient Hospital Stay: Attending: Oncology

## 2024-08-22 ENCOUNTER — Telehealth: Payer: Self-pay

## 2024-08-22 NOTE — Telephone Encounter (Signed)
 Followed up on patient's missed Palliative Care appointment today with Nikki Pickenpacker. Patient had requested a sooner appointment due to some new or worsening symptoms. Patient does have another Palliative Care appointment on 08/30/2023. This was included in the voicemail as a reminder.

## 2024-08-24 ENCOUNTER — Telehealth: Payer: Self-pay

## 2024-08-24 NOTE — Telephone Encounter (Signed)
 Followed up with patient's MyChart message. Patient had mentioned that she did not know about her Palliative Care Appointment for last Wednesday. The appointment was made for her as she had some concerns that she felt needed addressing sooner than her Palliative Appointment set for 08/29/24 at 10:00 am.   While on the phone, I did discuss how patient is currently doing. Patient felt that she did not need to come in sooner to be seen by Palliative Care and did not need any other intervention at this time.

## 2024-08-29 ENCOUNTER — Encounter: Payer: Self-pay | Admitting: Nurse Practitioner

## 2024-08-29 ENCOUNTER — Inpatient Hospital Stay: Admitting: Nurse Practitioner

## 2024-08-29 ENCOUNTER — Inpatient Hospital Stay: Attending: Oncology

## 2024-08-29 ENCOUNTER — Inpatient Hospital Stay: Admitting: Oncology

## 2024-08-29 VITALS — BP 111/76 | HR 68 | Temp 97.6°F | Resp 18 | Wt 166.0 lb

## 2024-08-29 DIAGNOSIS — M62838 Other muscle spasm: Secondary | ICD-10-CM | POA: Insufficient documentation

## 2024-08-29 DIAGNOSIS — K5909 Other constipation: Secondary | ICD-10-CM | POA: Diagnosis not present

## 2024-08-29 DIAGNOSIS — Z91011 Allergy to milk products, unspecified: Secondary | ICD-10-CM | POA: Insufficient documentation

## 2024-08-29 DIAGNOSIS — Z91013 Allergy to seafood: Secondary | ICD-10-CM | POA: Insufficient documentation

## 2024-08-29 DIAGNOSIS — Z79899 Other long term (current) drug therapy: Secondary | ICD-10-CM | POA: Diagnosis not present

## 2024-08-29 DIAGNOSIS — G893 Neoplasm related pain (acute) (chronic): Secondary | ICD-10-CM

## 2024-08-29 DIAGNOSIS — C180 Malignant neoplasm of cecum: Secondary | ICD-10-CM | POA: Diagnosis not present

## 2024-08-29 DIAGNOSIS — J45909 Unspecified asthma, uncomplicated: Secondary | ICD-10-CM | POA: Diagnosis not present

## 2024-08-29 DIAGNOSIS — Z888 Allergy status to other drugs, medicaments and biological substances status: Secondary | ICD-10-CM | POA: Diagnosis not present

## 2024-08-29 DIAGNOSIS — Z881 Allergy status to other antibiotic agents status: Secondary | ICD-10-CM | POA: Insufficient documentation

## 2024-08-29 DIAGNOSIS — D125 Benign neoplasm of sigmoid colon: Secondary | ICD-10-CM | POA: Diagnosis not present

## 2024-08-29 DIAGNOSIS — D759 Disease of blood and blood-forming organs, unspecified: Secondary | ICD-10-CM | POA: Diagnosis not present

## 2024-08-29 DIAGNOSIS — C779 Secondary and unspecified malignant neoplasm of lymph node, unspecified: Secondary | ICD-10-CM | POA: Insufficient documentation

## 2024-08-29 DIAGNOSIS — G620 Drug-induced polyneuropathy: Secondary | ICD-10-CM | POA: Diagnosis not present

## 2024-08-29 DIAGNOSIS — T451X5A Adverse effect of antineoplastic and immunosuppressive drugs, initial encounter: Secondary | ICD-10-CM | POA: Diagnosis not present

## 2024-08-29 DIAGNOSIS — M069 Rheumatoid arthritis, unspecified: Secondary | ICD-10-CM | POA: Insufficient documentation

## 2024-08-29 DIAGNOSIS — Z515 Encounter for palliative care: Secondary | ICD-10-CM

## 2024-08-29 DIAGNOSIS — Z882 Allergy status to sulfonamides status: Secondary | ICD-10-CM | POA: Insufficient documentation

## 2024-08-29 DIAGNOSIS — D649 Anemia, unspecified: Secondary | ICD-10-CM

## 2024-08-29 DIAGNOSIS — Z9049 Acquired absence of other specified parts of digestive tract: Secondary | ICD-10-CM | POA: Insufficient documentation

## 2024-08-29 DIAGNOSIS — R253 Fasciculation: Secondary | ICD-10-CM | POA: Diagnosis not present

## 2024-08-29 DIAGNOSIS — K1230 Oral mucositis (ulcerative), unspecified: Secondary | ICD-10-CM | POA: Insufficient documentation

## 2024-08-29 DIAGNOSIS — K7689 Other specified diseases of liver: Secondary | ICD-10-CM | POA: Insufficient documentation

## 2024-08-29 DIAGNOSIS — M792 Neuralgia and neuritis, unspecified: Secondary | ICD-10-CM

## 2024-08-29 LAB — CBC WITH DIFFERENTIAL (CANCER CENTER ONLY)
Abs Immature Granulocytes: 0 K/uL (ref 0.00–0.07)
Basophils Absolute: 0 K/uL (ref 0.0–0.1)
Basophils Relative: 1 %
Eosinophils Absolute: 0.1 K/uL (ref 0.0–0.5)
Eosinophils Relative: 3 %
HCT: 32.9 % — ABNORMAL LOW (ref 36.0–46.0)
Hemoglobin: 11.4 g/dL — ABNORMAL LOW (ref 12.0–15.0)
Immature Granulocytes: 0 %
Lymphocytes Relative: 49 %
Lymphs Abs: 1.3 K/uL (ref 0.7–4.0)
MCH: 31.5 pg (ref 26.0–34.0)
MCHC: 34.7 g/dL (ref 30.0–36.0)
MCV: 90.9 fL (ref 80.0–100.0)
Monocytes Absolute: 0.2 K/uL (ref 0.1–1.0)
Monocytes Relative: 7 %
Neutro Abs: 1.1 K/uL — ABNORMAL LOW (ref 1.7–7.7)
Neutrophils Relative %: 40 %
Platelet Count: 195 K/uL (ref 150–400)
RBC: 3.62 MIL/uL — ABNORMAL LOW (ref 3.87–5.11)
RDW: 15.4 % (ref 11.5–15.5)
WBC Count: 2.6 K/uL — ABNORMAL LOW (ref 4.0–10.5)
nRBC: 0 % (ref 0.0–0.2)

## 2024-08-29 LAB — CMP (CANCER CENTER ONLY)
ALT: 17 U/L (ref 0–44)
AST: 27 U/L (ref 15–41)
Albumin: 4.4 g/dL (ref 3.5–5.0)
Alkaline Phosphatase: 43 U/L (ref 38–126)
Anion gap: 11 (ref 5–15)
BUN: 9 mg/dL (ref 6–20)
CO2: 25 mmol/L (ref 22–32)
Calcium: 9 mg/dL (ref 8.9–10.3)
Chloride: 105 mmol/L (ref 98–111)
Creatinine: 0.86 mg/dL (ref 0.44–1.00)
GFR, Estimated: >60 mL/min
Glucose, Bld: 96 mg/dL (ref 70–99)
Potassium: 4.2 mmol/L (ref 3.5–5.1)
Sodium: 140 mmol/L (ref 135–145)
Total Bilirubin: 0.4 mg/dL (ref 0.0–1.2)
Total Protein: 7.7 g/dL (ref 6.5–8.1)

## 2024-08-29 LAB — CEA (ACCESS): CEA (CHCC): 1.73 ng/mL (ref 0.00–5.00)

## 2024-08-29 LAB — PREGNANCY, URINE: Preg Test, Ur: NEGATIVE

## 2024-08-29 MED ORDER — GABAPENTIN 100 MG PO CAPS: ORAL_CAPSULE | ORAL | 1 refills | Status: AC

## 2024-08-29 NOTE — Progress Notes (Signed)
 "    Palliative Medicine [facility]  [phone] [phone]   Name: Sherry Abbott Date: 08/29/2024 MRN: [id number]  DOB: 1975/08/24  Sherry Abbott Care Team: Sun, Vyvyan, MD as PCP - General (Family Medicine) Dasie Leonor CROME, MD as Consulting Physician (General Surgery) Autumn Millman, MD as Consulting Physician (Oncology) Mai Lynwood FALCON, MD as Consulting Physician (Rheumatology) Pickenpack-Cousar, Fannie SAILOR, NP as Nurse Practitioner (Hospice and Palliative Medicine) Silvano Valrie SQUIBB, RN as Registered Nurse    INTERVAL HISTORY: Sherry Abbott is a 49 y.o. 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  Palliative is seeing Sherry Abbott for symptom management and goals of care.   SOCIAL HISTORY:     reports that she has never smoked. She has never used smokeless tobacco. She reports that she does not drink alcohol and does not use drugs.  ADVANCE DIRECTIVES:  None on file   CODE STATUS: Full code  PAST MEDICAL HISTORY: Past Medical History:  Diagnosis Date   Allergic rhinitis    Allergic to food    blueberry   Allergic to shellfish    Asthma    Cancer (HCC)    Dermatographic urticaria    Hidradenitis suppurativa    Labral tear of right hip joint    surgery   Low back pain    Nausea and vomiting 01/17/2024   Other chronic allergic conjunctivitis    Polyarthralgia     ALLERGIES:  is allergic to betadine [povidone iodine], blueberry [vaccinium angustifolium], milk-related compounds, neosporin [neomycin-bacitracin zn-polymyx], povidone-iodine, shellfish allergy, sulfa antibiotics, adrucil  [fluorouracil ], neomycin, and other.  MEDICATIONS:  Current Outpatient Medications  Medication Sig Dispense Refill   gabapentin  (NEURONTIN ) 100 MG capsule Take one capsule (100mg ) in the  morning and 3 capsules (300mg ) at bedtime. 120 capsule 1   B Complex Vitamins (VITAMIN B-COMPLEX) TABS Take 1 tablet by mouth daily with supper.     bisacodyl  (DULCOLAX) 5 MG EC tablet Take 5 mg by mouth daily as needed for moderate constipation.     cetirizine  (ZYRTEC  ALLERGY) 10 MG tablet Take 1 tablet (10 mg total) by mouth at bedtime. 90 tablet 0   COLACE 100 MG capsule Take 200 mg by mouth in the morning and at bedtime.     dexamethasone  (DECADRON ) 4 MG tablet Take 2 tablets (8 mg total) by mouth daily. Start the day after chemotherapy for 2 days. Take with food. 30 tablet 1   diphenhydrAMINE  (BENADRYL ) 25 MG tablet Take 1 tablet (25 mg total) by mouth every 6 (six) hours as needed. 30 tablet 0   famotidine  (PEPCID ) 20 MG tablet Take 1 tablet (20 mg total) by mouth 2 (two) times daily. 30 tablet 0   fluticasone  (FLONASE ) 50 MCG/ACT nasal spray Place 2 sprays into both nostrils daily. 9.9 mL 0   lidocaine -prilocaine  (EMLA ) cream Apply to affected area once 30 g 3   mometasone (NASONEX) 50 MCG/ACT nasal spray Place 2 sprays into the nose daily as needed (Allergies).     montelukast  (SINGULAIR ) 10 MG tablet Take 10 mg by mouth at bedtime.     Multiple Vitamin (MULTIVITAMIN WITH MINERALS) TABS tablet Take 1 tablet by mouth daily.     OLANZapine  (ZYPREXA ) 5 MG tablet Take 1 tablet daily at bedtime for 3 days.  May repeat another dose in the morning, if persistent nausea. 30 tablet 1   ondansetron  (ZOFRAN ) 8 MG tablet  TAKE 1 TABLET (8 MG TOTAL) BY MOUTH EVERY 8 (EIGHT) HOURS AS NEEDED FOR NAUSEA OR VOMITING. START ON THE THIRD DAY AFTER CHEMOTHERAPY. 30 tablet 2   oxyCODONE  (OXY IR/ROXICODONE ) 5 MG immediate release tablet Take 1 tablet (5 mg total) by mouth every 8 (eight) hours as needed for severe pain (pain score 7-10) (5 mg for pain score 4-6, 10 mg for pain score 7-10). 12 tablet 0   polyethylene glycol powder (GLYCOLAX /MIRALAX ) 17 GM/SCOOP powder Take 17 g by mouth daily as needed. 238 g 0    scopolamine  (TRANSDERM-SCOP) 1 MG/3DAYS Place 1 patch (1 mg total) onto the skin every 3 (three) days. 10 patch 12   TYLENOL  500 MG tablet Take 500-1,000 mg by mouth every 6 (six) hours as needed for mild pain (pain score 1-3), headache or fever.     Current Facility-Administered Medications  Medication Dose Route Frequency Provider Last Rate Last Admin   Fremanezumab -vfrm SOSY 225 mg  225 mg Subcutaneous Once Ahern, Antonia B, MD        VITAL SIGNS: There were no vitals taken for this visit. There were no vitals filed for this visit.   Estimated body mass index is 27.62 kg/m as calculated from the following:   Height as of 06/22/24: 5' 5 (1.651 m).   Weight as of an earlier encounter on 08/29/24: 166 lb (75.3 kg).   PERFORMANCE STATUS (ECOG) : 1 - Symptomatic but completely ambulatory  Physical Exam General: NAD Cardiovascular: regular rate and rhythm Pulmonary: normal breathing pattern Extremities: no edema, no joint deformities Skin: no rashes Neurological: AAO x3  IMPRESSION: Discussed the use of AI scribe software for clinical note transcription with the Sherry Abbott, who gave verbal consent to proceed.  History of Present Illness Sherry Abbott is a 49 year old female with adenocarcinoma of the cecum who presents to clinic for symptom management follow-up.  No acute distress noted.  No family present.  Denies concerns of uncontrolled nausea, vomiting, constipation, or diarrhea.  Occasional fatigue however tries to remain as active as possible.  She completed her last chemotherapy treatment on December 18th and her last infusion on December 10th. Her energy levels are improving, and she is taking Cymbalta more regularly. She has been cautious about trying massages due to a previously low immune system however would like to consider now that she has completed treatment.SABRA  Her bowel movements have improved with dietary changes, including increased intake of probiotics and  fermented foods, leading to more regular bowel movements, now occurring three to four times a week, up from once or twice a week.  Sherry Abbott has been experiencing new onset knee pain for the past few weeks, which began a couple of days before Christmas. The pain was initially severe, rated as six or seven out of ten, and significantly impaired her mobility, making it difficult to walk, stand, or rise from a seated position without assistance. The pain has since improved to a three or four out of ten but remains persistent. She had no prior history of knee issues before this episode.  She also experiences ongoing tingling and numbness in her hands and feet, which has improved but is still present. This symptom has been persistent and was previously managed with gabapentin , which she found made her sleepy. The tingling is further exacerbated by frequent computer use.  We discussed the use of gabapentin  for her neuropathic symptoms.  Sherry Abbott previously taken without difficulty.  Education provided on the use of gabapentin   including potential side effects, efficacy, and administration.  Will restart gabapentin  at 100 mg in the morning and 300 mg at bedtime.  Sherry Abbott verbalized understanding.  All questions answered and support provided.  We will continue to closely monitor.   Assessment & Plan Adenocarcinoma of cecum Completed chemotherapy and infusion treatments as of December 18th, 2024. Currently experiencing improved energy levels and bowel movements with dietary modifications including probiotics and fermented foods. Immune system remains low but improving. - Continue dietary modifications with probiotics and fermented foods. - Consider massage therapy for symptom relief.  Neoplasm related pain and neuropathic pain Experiencing knee pain and tingling in hands and feet, likely related to recent chemotherapy. Knee pain has improved from a severity of 6-7 to 3-4. Tingling has improved but  persists. Gabapentin  previously caused drowsiness but is willing to retry at bedtime. CBD cream expected to arrive soon for topical use. - Prescribed gabapentin  100 mg in the morning and 200 mg at bedtime for the first two days, then increase to 300 mg at bedtime if tolerated. - Use CBD cream for neuropathic pain as it becomes available. - Monitor for side effects and adjust dosage as needed.  Follow-up I will plan to contact Sherry Abbott by phone next week to evaluate symptom management with use of gabapentin .  Will see him back in the clinic in 4-6 weeks.  Sooner if needed.  Sherry Abbott expressed understanding and was in agreement with this plan. She also understands that She can call the clinic at any time with any questions, concerns, or complaints.   Any controlled substances utilized were prescribed in the context of palliative care. PDMP has been reviewed.   I personally spent a total of 35 minutes in the care of the Sherry Abbott today including preparing to see the Sherry Abbott, getting/reviewing separately obtained history, performing a medically appropriate exam/evaluation, counseling and educating, placing orders, documenting clinical information in the EHR, and independently interpreting results. Visit consisted of counseling and education dealing with the complex and emotionally intense issues of symptom management and palliative care in the setting of serious and potentially life-threatening illness.  Levon Borer, AGPCNP-BC  Palliative Medicine Team/[facility]    "

## 2024-08-29 NOTE — Progress Notes (Signed)
 "  [facility]  ONCOLOGY CLINIC PROGRESS NOTE   Patient Care Team: Sun, Vyvyan, MD as PCP - General (Family Medicine) Dasie Leonor CROME, MD as Consulting Physician (General Surgery) Autumn Millman, MD as Consulting Physician (Oncology) Mai Lynwood FALCON, MD as Consulting Physician (Rheumatology) Pickenpack-Cousar, Fannie SAILOR, NP as Nurse Practitioner (Hospice and Palliative Medicine) Silvano Valrie SQUIBB, RN as Registered Nurse  PATIENT NAME: Sherry Abbott   MR#: [id number] DOB: 12/02/1975  Date of visit: 08/29/2024   ASSESSMENT & PLAN:   Sherry Abbott is a 49 y.o. 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  pT3, pN1a, cM0, stage III B disease.  MMR proficient.  Side effects to 5-FU infusion with jaw clenching, muscle spasms of the tongue and generalized muscle twitching.  Adenocarcinoma of cecum [facility]) Please review oncology history for additional details and timeline of events.    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 MMR proficient.   Previously I discussed diagnosis, staging, prognosis, plan of care, treatment options.  Reviewed NCCN guidelines.  Patient had allergic reaction to FOLFOX regimen, likely to 5-FU pump. It could not be continued any longer.   Patient was agreeable to proceeding with capecitabine  and she started that from 03/29/2024.  Dose was reduced to avoid toxicity/risk of reaction.  Calculated dose is 1500 mg p.o. every morning and 1000 mg p.o. nightly for 2 weeks on, 1 week off.  Will plan to do oxaliplatin  at half-strength at a slower rate with each cycle.  She started oxaliplatin  from 03/30/2024.    During cycle 3 of CapeOx, because of  progressive side effects including nausea, Xeloda  dose was decreased to 1000 mg twice daily.  She is tolerating this better.  She is tolerating treatment reasonably well so far, but has been experiencing grade 2 hand-foot syndrome, nausea, fatigue.    Restaging CT chest, abdomen and pelvis on 07/11/2024 showed no evidence of disease.  She completed 6 cycles of CapeOx as of 07/18/2024.  Going forward, we will continue surveillance as per NCCN guidelines.  - She was previously referred to genetic counseling for genetic testing.  - Monitor with circulating tumor DNA test and CT scans every three months for two years.  Circulating tumor DNA test was negative on 02/06/2024 and on 05/11/2024.  We obtained a repeat value today.  Will follow-up on the results when available  -We will schedule colonoscopy one year post-surgery.  Will obtain restaging CT chest, abdomen pelvis in first half of March 2026 and I will see her in clinic with results.  Chemotherapy-induced cytopenias Persistent but improving cytopenias following chemotherapy. Platelet count is normal, hemoglobin is near normal, and white blood cell count remains low with absolute neutrophil count of 1,100. Mildly reduced immunity with slightly increased infection risk, but not at high risk. No increased bleeding risk. No indication for growth factor support or booster at this time. Chemistries pending, previously normal. - Advised to avoid crowds and individuals with apparent illness during cold and influenza season. - Advised to wear a mask in public and during air travel. - No additional growth factors or booster indicated at this time. - Planned to check iron , B12, and folic acid  at next visit. - Advised to arrange for port flush at  next visit. - Advised to take low-dose aspirin on day of air travel to reduce thrombotic risk. - Advised to ambulate every couple of hours, perform in-flight exercises, and maintain hydration during  flights.  Knee pain Persistent but improving knee pain since approximately one week after completing chemotherapy. No acute intervention required as symptoms are improving.  Chemotherapy-induced cold sensitivity and neuropathy Cold sensitivity persists, requiring warm fluids post-treatment. Tingling and numbness in hands and feet expected to take longer to resolve post-treatment. - Advise continuation of warm fluid intake to manage cold sensitivity. - Educate that neuropathy may take 3-4 weeks post-treatment to improve, with full resolution taking longer. Experiencing tingling and numbness, likely related to oxaliplatin . Previous trials of Lyrica and gabapentin  were ineffective or caused drowsiness. Plan to reassess after completion of chemotherapy. 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 Capecitabine  is proposed as an alternative due to its different side effect profile. - Discontinue 5-FU due to severe allergic reaction. - Monitor for any allergic reactions to capecitabine .  I reviewed lab results and outside records for this visit and discussed relevant results with the patient. Diagnosis, plan of care and treatment options were also discussed in detail with the patient. Opportunity provided to ask questions and answers provided to her apparent satisfaction. Provided instructions to call our clinic with any problems, questions or concerns prior to return visit. I recommended to continue follow-up with PCP and sub-specialists. She verbalized understanding and agreed with the plan.   NCCN guidelines have been consulted in the planning of this patients care.  I spent a total of 35  minutes during this encounter with the patient including review of chart and various tests results, discussions about plan of care and coordination of care plan.   Chinita Patten, MD  08/29/2024 10:16 AM  [facility] - A DEPT OF [facility] [address] [place] [address] Dept: [phone] Dept Fax: [phone]    CHIEF COMPLAINT/ REASON FOR VISIT:   Invasive adenocarcinoma of the cecum, status post hemicolectomy on 01/19/2024.  pT3, pN1a, cM0, stage III B disease.  MMR proficient.   Current Treatment: Adjuvant systemic chemotherapy with FOLFOX started from 02/15/2024.  However she had bad reaction to presumably 5-FU.  Treatment switched to XELOX from 03/29/2024.  Completed treatments on 07/18/2024.  INTERVAL HISTORY:    Discussed the use of AI scribe software for clinical note transcription with the patient, who gave verbal consent to proceed.  History of Present Illness  Sherry Abbott is a 49 year old female with stage II colon cancer, status post chemotherapy, who presents for oncology follow-up to assess disease status and cytopenia recovery.  She completed chemotherapy for stage II colon cancer and is currently in the surveillance phase. Recent imaging was performed last month, and circulating tumor DNA was tested on two occasions (June and October 2025). She is scheduled for repeat surveillance imaging in early March. She reports significant overall improvement and denies fevers, chills, night sweats, new masses, nausea, or vomiting. She experiences occasional heartburn, particularly with increased food intake, but this is mild. Bowel movements have improved with dietary modifications, now occurring three to four times per week after increasing fermented foods.  She continues to have persistent knee pain that began approximately one week after completing chemotherapy. The pain has improved since the last  visit but is not fully resolved.  Her blood counts remain incompletely recovered post-chemotherapy. Platelet count is normal, hemoglobin is near normal at 11.4 g/dL, and white blood cell count is 2,600/L. Absolute neutrophil count is 1,100/L. She is not experiencing abnormal bleeding or bruising.  She discontinued orthodontic braces during chemotherapy due to oral mucositis and cold sensitivity, both of which have resolved. She is considering resuming traditional braces, with approximately six months of orthodontic treatment remaining. She has no current oral symptoms.  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 She is planning travel to [facility] in February and is aware of the need for general precautions, including mask use and maintaining hydration during air travel.  I have reviewed the past medical history, past surgical history, social history and family history with the patient and they are unchanged from previous note.  HISTORY OF PRESENT ILLNESS:   ONCOLOGY HISTORY:   Patient had routine screening colonoscopy on 01/12/2024, performed by Dr. Saintclair.  It showed a fungating, infiltrative, sessile, polypoid and ulcerated nonobstructing large mass in the cecum and in the ascending colon.  The mass was partially circumferential involving two thirds of the lumen circumference, measuring at least 7 cm in length.  Biopsies were obtained.  3 sessile polyps were found in the transverse colon, descending colon and sigmoid colon measuring up to 6 mm, removed.  Pathology from the cecal mass came back positive for invasive moderately differentiated adenocarcinoma.  MMR proficient.   On 01/16/2024, CT abdomen and pelvis showed 2.8 cm cyst in the left hepatic lobe, simple appearing.  No pathologic lymphadenopathy or other acute findings were noted.   She was referred for surgical oncology evaluation.  She was  supposed to see Dr. Ann on 01/17/2024 but presented to the ED that same day with complaints of worsening right lower quadrant abdominal pain, nausea, vomiting and inability to tolerate oral intake.  She was admitted to the hospital for further evaluation and management.   She was seen by Dr. Dasie in the hospital and she underwent laparoscopic-assisted right hemicolectomy, excision of peritoneal cyst on 01/19/2024.  Final pathology showed invasive moderately differentiated adenocarcinoma with focal mucinous differentiation arising within a tubulovillous adenoma, measuring 6.7 cm in greatest dimension with involvement of submucosa, muscularis propria and into pericolonic adipose tissue.  1 out of 26 lymph nodes were positive for malignancy.  There was another single positive tumor deposit.  All margins were negative.  Grade 2.  No evidence of LVI or PNI.  MMR proficient.     On her consultation with us  on 01/25/2024, request placed for CT of the chest for staging.  On 02/07/2024, staging CT of the chest showed no evidence of intrathoracic metastatic disease.  pT3,pN1a, cM0, Stage III B disease.    Plan made for adjuvant chemotherapy with modified FOLFOX-6, every 2 weeks for up to 12 cycles.  Started cycle 1 on 02/15/2024.  Initially it seemed as if she has suffered an allergic reaction to oxaliplatin  with tongue muscle twitching, difficulty speaking, and jaw muscle tightness, the day after chemo.  Discussed alternatives.  Plan made to proceed with reduced dose oxaliplatin  at 50% at a slower rate over 3 hours with extra allergy medicines including Benadryl  and Pepcid .   She received cycle 2 of  FOLFOX without 5-FU pump and dose reduced oxaliplatin  at a slower rate on 02/27/2024.  On 02/28/2024, she had to present to the ED again with similar complaints of tongue muscle twitching, jaw clenching etc.  Symptoms probably resolved after stopping 5-FU pump in the ED.  It is now clear that she has allergic reaction  to 5-FU infusion.  We cannot use it anymore because of the severity of reaction.  Patient willing to try capecitabine  with oxaliplatin .  If she were to suffer similar side effects, then we will have to hold chemotherapy and continue close surveillance with Guardant reveal and CT imaging.  Started Capecitabine  from 03/29/2024. Oxaliplatin  started from 03/30/24.  Tolerating this regimen well so far.  Guardant reveal test in October 2025 showed no evidence of circulating tumor DNA.  Restaging CT chest, abdomen and pelvis on 07/11/2024 showed no evidence of disease.  Completed 6 cycles of CapeOx as of 07/18/2024.  Plan to continue surveillance as per NCCN guidelines.   Oncology History  Adenocarcinoma of cecum (HCC)  01/17/2024 Initial Diagnosis   Adenocarcinoma of cecum (HCC)   01/25/2024 Cancer Staging   Staging form: Colon and Rectum, AJCC 8th Edition - Pathologic: Stage IIIB (pT3, pN1a, cM0) - Signed by Autumn Millman, MD on 02/15/2024 Total positive nodes: 1 Histologic grading system: 4 grade system Histologic grade (G): G2 Residual tumor (R): R0   02/15/2024 - 02/29/2024 Chemotherapy   Patient is on Treatment Plan : COLORECTAL FOLFOX q14d x 3 months     03/30/2024 -  Chemotherapy   Patient is on Treatment Plan : COLORECTAL Xelox (Capeox)(130/850) q21d         REVIEW OF SYSTEMS:   Review of Systems - Oncology  All other pertinent systems were reviewed with the patient and are negative.  ALLERGIES: She is allergic to betadine [povidone iodine], blueberry [vaccinium angustifolium], milk-related compounds, neosporin [neomycin-bacitracin zn-polymyx], povidone-iodine, shellfish allergy, sulfa antibiotics, adrucil  [fluorouracil ], neomycin, and other.  MEDICATIONS:  Current Outpatient Medications  Medication Sig Dispense Refill   B Complex Vitamins (VITAMIN B-COMPLEX) TABS Take 1 tablet by mouth daily with supper.     bisacodyl  (DULCOLAX) 5 MG EC tablet Take 5 mg by mouth daily as  needed for moderate constipation.     cetirizine  (ZYRTEC  ALLERGY) 10 MG tablet Take 1 tablet (10 mg total) by mouth at bedtime. 90 tablet 0   COLACE 100 MG capsule Take 200 mg by mouth in the morning and at bedtime.     dexamethasone  (DECADRON ) 4 MG tablet Take 2 tablets (8 mg total) by mouth daily. Start the day after chemotherapy for 2 days. Take with food. 30 tablet 1   diphenhydrAMINE  (BENADRYL ) 25 MG tablet Take 1 tablet (25 mg total) by mouth every 6 (six) hours as needed. 30 tablet 0   famotidine  (PEPCID ) 20 MG tablet Take 1 tablet (20 mg total) by mouth 2 (two) times daily. 30 tablet 0   fluticasone  (FLONASE ) 50 MCG/ACT nasal spray Place 2 sprays into both nostrils daily. 9.9 mL 0   lidocaine -prilocaine  (EMLA ) cream Apply to affected area once 30 g 3   mometasone (NASONEX) 50 MCG/ACT nasal spray Place 2 sprays into the nose daily as needed (Allergies).     montelukast  (SINGULAIR ) 10 MG tablet Take 10 mg by mouth at bedtime.     Multiple Vitamin (MULTIVITAMIN WITH MINERALS) TABS tablet Take 1 tablet by mouth daily.     OLANZapine  (ZYPREXA ) 5 MG tablet Take 1 tablet daily at bedtime for 3 days.  May repeat another dose in the morning, if persistent nausea. 30 tablet 1   ondansetron  (ZOFRAN ) 8 MG tablet TAKE 1 TABLET (8 MG TOTAL) BY MOUTH EVERY 8 (EIGHT) HOURS AS NEEDED FOR NAUSEA OR VOMITING. START ON THE THIRD DAY AFTER CHEMOTHERAPY. 30 tablet 2   oxyCODONE  (OXY IR/ROXICODONE ) 5 MG immediate release tablet Take 1 tablet (5 mg total) by mouth every 8 (eight) hours as needed for severe pain (pain score 7-10) (5 mg for pain score 4-6, 10 mg for pain score 7-10). 12 tablet 0   polyethylene glycol powder (GLYCOLAX /MIRALAX ) 17 GM/SCOOP powder Take 17 g by mouth daily as needed. 238 g 0   scopolamine  (TRANSDERM-SCOP) 1 MG/3DAYS Place 1 patch (1 mg total) onto the skin every 3 (three) days. 10 patch 12   TYLENOL  500 MG tablet Take 500-1,000 mg by mouth every 6 (six) hours as needed for mild pain (pain  score 1-3), headache or fever.     Current Facility-Administered Medications  Medication Dose Route Frequency Provider Last Rate Last Admin   Fremanezumab -vfrm SOSY 225 mg  225 mg Subcutaneous Once Ines Onetha NOVAK, MD         VITALS:   Blood pressure 111/76, pulse 68, temperature 97.6 F (36.4 C), temperature source Temporal, resp. rate 18, weight 166 lb (75.3 kg), SpO2 100%.  Wt Readings from Last 3 Encounters:  08/29/24 166 lb (75.3 kg)  07/18/24 166 lb (75.3 kg)  06/22/24 162 lb 12.8 oz (73.8 kg)    Body mass index is 27.62 kg/m.    Onc Performance Status - 08/29/24 0957       ECOG Perf Status   ECOG Perf Status Restricted in physically strenuous activity but ambulatory and able to carry out work of a light or sedentary nature, e.g., light house work, office work      KPS SCALE   KPS % SCORE Normal activity with effort, some s/s of disease   Improving. Sometimes at a 90           PHYSICAL EXAM:   Physical Exam Constitutional:      General: She is not in acute distress.    Appearance: Normal appearance.  HENT:     Head: Normocephalic and atraumatic.  Cardiovascular:     Rate and Rhythm: Normal rate.  Pulmonary:     Effort: Pulmonary effort is normal. No respiratory distress.  Chest:     Comments: Right-sided Port-A-Cath in place without any signs of infection Abdominal:     General: There is no distension.  Neurological:     General: No focal deficit present.     Mental Status: She is alert and oriented to person, place, and time.  Psychiatric:        Mood and Affect: Mood normal.        Behavior: Behavior normal.       LABORATORY DATA:   I have reviewed the data as listed.  Results for orders placed or performed in visit on 08/29/24  Pregnancy, urine  Result Value Ref Range   Preg Test, Ur NEGATIVE NEGATIVE  CEA (Access)  Result Value Ref Range   CEA ([facility]) 1.73 0.00 - 5.00 ng/mL  CMP (Cancer Center only)  Result Value Ref Range   Sodium  140 135 - 145 mmol/L   Potassium 4.2 3.5 - 5.1 mmol/L   Chloride 105 98 - 111 mmol/L   CO2 25 22 - 32 mmol/L   Glucose, Bld 96 70 - 99 mg/dL  BUN 9 6 - 20 mg/dL   Creatinine 9.13 9.55 - 1.00 mg/dL   Calcium  9.0 8.9 - 10.3 mg/dL   Total Protein 7.7 6.5 - 8.1 g/dL   Albumin  4.4 3.5 - 5.0 g/dL   AST 27 15 - 41 U/L   ALT 17 0 - 44 U/L   Alkaline Phosphatase 43 38 - 126 U/L   Total Bilirubin 0.4 0.0 - 1.2 mg/dL   GFR, Estimated >39 >39 mL/min   Anion gap 11 5 - 15  CBC with Differential (Cancer Center Only)  Result Value Ref Range   WBC Count 2.6 (L) 4.0 - 10.5 K/uL   RBC 3.62 (L) 3.87 - 5.11 MIL/uL   Hemoglobin 11.4 (L) 12.0 - 15.0 g/dL   HCT 67.0 (L) 63.9 - 53.9 %   MCV 90.9 80.0 - 100.0 fL   MCH 31.5 26.0 - 34.0 pg   MCHC 34.7 30.0 - 36.0 g/dL   RDW 84.5 88.4 - 84.4 %   Platelet Count 195 150 - 400 K/uL   nRBC 0.0 0.0 - 0.2 %   Neutrophils Relative % 40 %   Neutro Abs 1.1 (L) 1.7 - 7.7 K/uL   Lymphocytes Relative 49 %   Lymphs Abs 1.3 0.7 - 4.0 K/uL   Monocytes Relative 7 %   Monocytes Absolute 0.2 0.1 - 1.0 K/uL   Eosinophils Relative 3 %   Eosinophils Absolute 0.1 0.0 - 0.5 K/uL   Basophils Relative 1 %   Basophils Absolute 0.0 0.0 - 0.1 K/uL   Immature Granulocytes 0 %   Abs Immature Granulocytes 0.00 0.00 - 0.07 K/uL     RADIOGRAPHIC STUDIES:  CT CHEST ABDOMEN PELVIS W CONTRAST CLINICAL DATA:  Colorectal cancer, restaging.  * Tracking Code: BO *  EXAM: CT CHEST, ABDOMEN, AND PELVIS WITH CONTRAST  TECHNIQUE: Multidetector CT imaging of the chest, abdomen and pelvis was performed following the standard protocol during bolus administration of intravenous contrast.  RADIATION DOSE REDUCTION: This exam was performed according to the departmental dose-optimization program which includes automated exposure control, adjustment of the mA and/or kV according to patient size and/or use of iterative reconstruction technique.  CONTRAST:  OMNIPAQUE  IOHEXOL  300  MG/ML  SOLN  COMPARISON:  Multiple priors including CT February 07, 2024 and March 06, 2024  FINDINGS: CT CHEST FINDINGS  Cardiovascular: Accessed right chest Port-A-Cath with tip at the superior cavoatrial junction. Normal caliber thoracic aorta. Normal size heart. No significant pericardial effusion/thickening.  Mediastinum/Nodes: No suspicious thyroid nodule. No supraclavicular adenopathy. No pathologically enlarged mediastinal, hilar or axillary lymph nodes. The esophagus is grossly unremarkable.  Lungs/Pleura: Stable 6 mm right lower lobe calcified pulmonary nodule stable dating back to 2009. No new suspicious pulmonary nodules or masses. Central airways are patent. No pleural effusion. No pneumothorax. Bibasilar atelectasis/scarring.  Musculoskeletal: No aggressive lytic or blastic lesion of bone.  CT ABDOMEN PELVIS FINDINGS  Hepatobiliary: No suspicious hepatic lesion. Gallbladder is unremarkable. No biliary ductal dilation.  Pancreas: No pancreatic ductal dilation or evidence of acute inflammation.  Spleen: No splenomegaly or focal splenic lesion.  Adrenals/Urinary Tract: No suspicious adrenal nodule/mass. No hydronephrosis. Kidneys demonstrate symmetric enhancement. Urinary bladder is unremarkable for degree of distension.  Stomach/Bowel: Prior right hemicolectomy with ileocolic anastomosis common no suspicious soft tissue nodularity along the suture line.  Colonic stool burden compatible with constipation. Radiopaque enteric contrast material traverses distal loops of small bowel. No evidence of bowel obstruction.  Vascular/Lymphatic: Normal caliber abdominal aorta. Smooth IVC contours. The  portal, splenic and superior mesenteric veins are patent. No pathologically enlarged abdominal or pelvic lymph nodes.  Reproductive: Small fundal fibroid.  Other: No significant abdominopelvic free fluid.  Musculoskeletal: No aggressive lytic or blastic lesion of  bone.  IMPRESSION: 1. Prior right hemicolectomy with ileocolic anastomosis. No evidence of local recurrence. 2. No evidence of metastatic disease within the chest, abdomen or pelvis. 3. Colonic stool burden compatible with constipation.  Electronically Signed   By: Reyes Holder M.D.   On: 07/17/2024 07:05     CODE STATUS:  Code Status History     Date Active Date Inactive Code Status Order ID Comments User Context   01/17/2024 0459 01/23/2024 1727 Full Code [id number]  Alfornia Madison, MD ED    Questions for Most Recent Historical Code Status (Order [id number])     Question Answer   By: Consent: discussion documented in EHR            No orders of the defined types were placed in this encounter.    Future Appointments  Date Time Provider Department Center  09/05/2024 12:00 PM [facility] P[facility] None       This document was completed utilizing speech recognition software. Grammatical errors, random word insertions, pronoun errors, and incomplete sentences are an occasional consequence of this system due to software limitations, ambient noise, and hardware issues. Any formal questions or concerns about the content, text or information contained within the body of this dictation should be directly addressed to the provider for clarification.   "

## 2024-09-01 ENCOUNTER — Encounter: Payer: Self-pay | Admitting: Oncology

## 2024-09-01 NOTE — Assessment & Plan Note (Signed)
 Please review oncology history for additional details and timeline of events.    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 MMR proficient.   Previously I discussed diagnosis, staging, prognosis, plan of care, treatment options.  Reviewed NCCN guidelines.  Patient had allergic reaction to FOLFOX regimen, likely to 5-FU pump. It could not be continued any longer.   Patient was agreeable to proceeding with capecitabine  and she started that from 03/29/2024.  Dose was reduced to avoid toxicity/risk of reaction.  Calculated dose is 1500 mg p.o. every morning and 1000 mg p.o. nightly for 2 weeks on, 1 week off.  Will plan to do oxaliplatin  at half-strength at a slower rate with each cycle.  She started oxaliplatin  from 03/30/2024.    During cycle 3 of CapeOx, because of progressive side effects including nausea, Xeloda  dose was decreased to 1000 mg twice daily.  She is tolerating this better.  She is tolerating treatment reasonably well so far, but has been experiencing grade 2 hand-foot syndrome, nausea, fatigue.    Restaging CT chest, abdomen and pelvis on 07/11/2024 showed no evidence of disease.  She completed 6 cycles of CapeOx as of 07/18/2024.  Going forward, we will continue surveillance as per NCCN guidelines.  - She was previously referred to genetic counseling for genetic testing.  - Monitor with circulating tumor DNA test and CT scans every three months for two years.  Circulating tumor DNA test was negative on 02/06/2024 and on 05/11/2024.  We obtained a repeat value today.  Will follow-up on the results when available  -We will schedule colonoscopy one year post-surgery.  Will obtain restaging CT chest, abdomen pelvis in first half of March 2026 and I will see her in clinic with results.

## 2024-09-05 ENCOUNTER — Encounter: Payer: Self-pay | Admitting: Nurse Practitioner

## 2024-09-05 ENCOUNTER — Inpatient Hospital Stay (HOSPITAL_BASED_OUTPATIENT_CLINIC_OR_DEPARTMENT_OTHER): Admitting: Nurse Practitioner

## 2024-09-05 DIAGNOSIS — R202 Paresthesia of skin: Secondary | ICD-10-CM

## 2024-09-05 DIAGNOSIS — R530 Neoplastic (malignant) related fatigue: Secondary | ICD-10-CM

## 2024-09-05 DIAGNOSIS — M792 Neuralgia and neuritis, unspecified: Secondary | ICD-10-CM

## 2024-09-05 DIAGNOSIS — Z515 Encounter for palliative care: Secondary | ICD-10-CM | POA: Diagnosis not present

## 2024-09-05 DIAGNOSIS — C180 Malignant neoplasm of cecum: Secondary | ICD-10-CM

## 2024-09-05 DIAGNOSIS — R110 Nausea: Secondary | ICD-10-CM | POA: Diagnosis not present

## 2024-09-05 DIAGNOSIS — K5900 Constipation, unspecified: Secondary | ICD-10-CM | POA: Diagnosis not present

## 2024-09-05 NOTE — Progress Notes (Signed)
 "    Palliative Medicine [facility]  Telephone:([phone]) [phone] Fax:([phone]) [phone]   Name: Sherry Abbott Date: 09/05/2024 MRN: [id number]  DOB: [birth year]  Patient Care Team: Sun, Vyvyan, MD as PCP - General (Family Medicine) Dasie Leonor CROME, MD as Consulting Physician (General Surgery) Autumn Millman, MD as Consulting Physician (Oncology) Mai Lynwood FALCON, MD as Consulting Physician (Rheumatology) Pickenpack-Cousar, Fannie SAILOR, NP as Nurse Practitioner (Hospice and Palliative Medicine) Silvano Valrie SQUIBB, RN as Registered Nurse   I connected with Cristin Denise Nicoll on 09/05/24 at 12:00 PM EST by telephone and verified that I am speaking with the correct person using two identifiers.   I discussed the limitations, risks, security and privacy concerns of performing an evaluation and management service by telemedicine and the availability of in-person appointments. I also discussed with the patient that there may be a patient responsible charge related to this service. The patient expressed understanding and agreed to proceed.   Other persons participating in the visit and their role in the encounter: N/A   Patients location: Home   Providers location: [facility]   INTERVAL HISTORY: Sherry Abbott is a 49 y.o. 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  Palliative is seeing patient for symptom management and goals of care.   SOCIAL HISTORY:     reports that she has never smoked. She has never used smokeless tobacco. She reports that she does not drink alcohol and does not use drugs.  ADVANCE DIRECTIVES:  None on file   CODE STATUS: Full code  PAST MEDICAL HISTORY: Past Medical History:  Diagnosis Date   Allergic rhinitis    Allergic to food    blueberry   Allergic to shellfish    Asthma     Cancer (HCC)    Dermatographic urticaria    Hidradenitis suppurativa    Labral tear of right hip joint    surgery   Low back pain    Nausea and vomiting 01/17/2024   Other chronic allergic conjunctivitis    Polyarthralgia     ALLERGIES:  is allergic to betadine [povidone iodine], blueberry [vaccinium angustifolium], milk-related compounds, neosporin [neomycin-bacitracin zn-polymyx], povidone-iodine, shellfish allergy, sulfa antibiotics, adrucil  [fluorouracil ], neomycin, and other.  MEDICATIONS:  Current Outpatient Medications  Medication Sig Dispense Refill   B Complex Vitamins (VITAMIN B-COMPLEX) TABS Take 1 tablet by mouth daily with supper.     bisacodyl  (DULCOLAX) 5 MG EC tablet Take 5 mg by mouth daily as needed for moderate constipation.     cetirizine  (ZYRTEC  ALLERGY) 10 MG tablet Take 1 tablet (10 mg total) by mouth at bedtime. 90 tablet 0   COLACE 100 MG capsule Take 200 mg by mouth in the morning and at bedtime.     dexamethasone  (DECADRON ) 4 MG tablet Take 2 tablets (8 mg total) by mouth daily. Start the day after chemotherapy for 2 days. Take with food. 30 tablet 1   diphenhydrAMINE  (BENADRYL ) 25 MG tablet Take 1 tablet (25 mg total) by mouth every 6 (six) hours as needed. 30 tablet 0   famotidine  (PEPCID ) 20 MG tablet Take 1 tablet (20 mg total) by mouth 2 (two) times daily. 30 tablet 0   fluticasone  (FLONASE ) 50 MCG/ACT nasal spray Place 2 sprays into both nostrils daily. 9.9 mL 0   gabapentin  (NEURONTIN ) 100 MG capsule Take one capsule (100mg ) in the morning and 3 capsules (300mg ) at bedtime. 120  capsule 1   lidocaine -prilocaine  (EMLA ) cream Apply to affected area once 30 g 3   mometasone (NASONEX) 50 MCG/ACT nasal spray Place 2 sprays into the nose daily as needed (Allergies).     montelukast  (SINGULAIR ) 10 MG tablet Take 10 mg by mouth at bedtime.     Multiple Vitamin (MULTIVITAMIN WITH MINERALS) TABS tablet Take 1 tablet by mouth daily.     OLANZapine  (ZYPREXA ) 5 MG  tablet Take 1 tablet daily at bedtime for 3 days.  May repeat another dose in the morning, if persistent nausea. 30 tablet 1   ondansetron  (ZOFRAN ) 8 MG tablet TAKE 1 TABLET (8 MG TOTAL) BY MOUTH EVERY 8 (EIGHT) HOURS AS NEEDED FOR NAUSEA OR VOMITING. START ON THE THIRD DAY AFTER CHEMOTHERAPY. 30 tablet 2   oxyCODONE  (OXY IR/ROXICODONE ) 5 MG immediate release tablet Take 1 tablet (5 mg total) by mouth every 8 (eight) hours as needed for severe pain (pain score 7-10) (5 mg for pain score 4-6, 10 mg for pain score 7-10). 12 tablet 0   polyethylene glycol powder (GLYCOLAX /MIRALAX ) 17 GM/SCOOP powder Take 17 g by mouth daily as needed. 238 g 0   scopolamine  (TRANSDERM-SCOP) 1 MG/3DAYS Place 1 patch (1 mg total) onto the skin every 3 (three) days. 10 patch 12   TYLENOL  500 MG tablet Take 500-1,000 mg by mouth every 6 (six) hours as needed for mild pain (pain score 1-3), headache or fever.     Current Facility-Administered Medications  Medication Dose Route Frequency Provider Last Rate Last Admin   Fremanezumab -vfrm SOSY 225 mg  225 mg Subcutaneous Once Ines Onetha NOVAK, MD        VITAL SIGNS: There were no vitals taken for this visit. There were no vitals filed for this visit.   Estimated body mass index is 27.62 kg/m as calculated from the following:   Height as of 06/22/24: 5' 5 (1.651 m).   Weight as of 08/29/24: 166 lb (75.3 kg).   PERFORMANCE STATUS (ECOG) : 1 - Symptomatic but completely ambulatory  IMPRESSION: Discussed the use of AI scribe software for clinical note transcription with the patient, who gave verbal consent to proceed.  History of Present Illness History of Present Illness Sherry Abbott is a 49 year old female with history of adenocarcinoma of the cecum who I connected with by phone for symptom management follow-up. She reports doing well overall. Occasional fatigue however she is able to remain as active as possible.   She has been taking Zofran  as needed for  nausea, which occurs occasionally, particularly when exposed to strong odors or triggered by specific smells, such as garlic. Nesreen reports occasional constipation, managed with dietary changes, including fermented foods and fruits like apples and pears. Despite these efforts, she has not had a bowel movement in the past two days, which she attributes to a decreased appetite and absence of her routine of eating chia seed yogurt.   We discussed her neuropathic symptoms and knee pain at length. Mrs. Curl successfully started taking gabapentin  last week, initially at a dose of one capsule in the morning and two in the evening. There was minimal relief initially, but after increasing the dose to three capsules at night, there was significant improvement in her knee pain. However, the numbness and tingling in her hands, particularly in her right middle finger, have not improved significantly. These symptoms worsen with activity, especially typing, which she does frequently. States she is tolerating Gabapentin , it does not cause excessive daytime sleepiness, and  she continues to take one capsule in the morning. We discussed increasing dose to 2 capsules in the morning (200mg ) versus one capsule twice a day to improve hand neuropathy symptoms. She verbalized understanding and will continue to monitor symptoms over the next week. Advised to continue with dose of 300mg  at bedtime.   All questions answered and support provided.  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 Gabapentin  has been initiated, with some improvement in knee pain but persistent numbness and tingling in the right hand, especially with activity. No significant side effects from gabapentin , such as excessive drowsiness. - Continue gabapentin  100 mg in the morning and 300 mg at night. - If no improvement  by next Tuesday, increase gabapentin  to either 200 mg in the morning and 300 mg at night, or 100 mg in the morning, 100 mg at noon, and 300 mg at night. - Monitor symptoms and adjust dosage as needed.  Constipation Chronic constipation, managed with dietary modifications including fermented foods and fruits. Recent decrease in bowel movements due to reduced appetite and dietary intake. - Continue dietary modifications with fermented foods and fruits.  Nausea Intermittent nausea, generally well-controlled with Zofran  as needed. Nausea exacerbated by strong odors. - Continue Zofran  as needed for nausea. - Avoid strong odors that trigger nausea.  Follow-Up We will plan to touch base by phone in 1-2 weeks for close evaluation of symptoms. Sooner if needed.  Patient expressed understanding and was in agreement with this plan. She also understands that She can call the clinic at any time with any questions, concerns, or complaints.   Any controlled substances utilized were prescribed in the context of palliative care. PDMP has been reviewed.   I provided 35 minutes of non face-to-face telephone visit time during this encounter, and > 50% was spent counseling as documented under my assessment & plan.  Visit consisted of counseling and education dealing with the complex and emotionally intense issues of symptom management and palliative care in the setting of serious and potentially life-threatening illness.  Levon Borer, AGPCNP-BC  Palliative Medicine Team/[facility]    "

## 2024-09-07 ENCOUNTER — Encounter: Payer: Self-pay | Admitting: Oncology

## 2024-09-12 LAB — GUARDANT REVEAL

## 2024-09-13 ENCOUNTER — Other Ambulatory Visit: Payer: Self-pay

## 2024-09-18 ENCOUNTER — Inpatient Hospital Stay: Attending: Oncology
